# Patient Record
Sex: Female | Born: 1940
Health system: Southern US, Community
[De-identification: ages and names within clinical notes are randomized; demographics above are authoritative.]

## PROBLEM LIST (undated history)

## (undated) DIAGNOSIS — F329 Major depressive disorder, single episode, unspecified: Secondary | ICD-10-CM

## (undated) DIAGNOSIS — I10 Essential (primary) hypertension: Secondary | ICD-10-CM

## (undated) DIAGNOSIS — T4145XA Adverse effect of unspecified anesthetic, initial encounter: Secondary | ICD-10-CM

## (undated) DIAGNOSIS — R55 Syncope and collapse: Secondary | ICD-10-CM

## (undated) DIAGNOSIS — F419 Anxiety disorder, unspecified: Secondary | ICD-10-CM

## (undated) DIAGNOSIS — F32A Depression, unspecified: Secondary | ICD-10-CM

## (undated) DIAGNOSIS — K219 Gastro-esophageal reflux disease without esophagitis: Secondary | ICD-10-CM

## (undated) DIAGNOSIS — T8859XA Other complications of anesthesia, initial encounter: Secondary | ICD-10-CM

## (undated) HISTORY — DX: Syncope and collapse: R55

## (undated) HISTORY — DX: Gastro-esophageal reflux disease without esophagitis: K21.9

---

## 2008-02-24 HISTORY — PX: BACK SURGERY: SHX140

## 2016-04-28 DIAGNOSIS — M1711 Unilateral primary osteoarthritis, right knee: Secondary | ICD-10-CM | POA: Diagnosis not present

## 2016-04-30 DIAGNOSIS — Z6828 Body mass index (BMI) 28.0-28.9, adult: Secondary | ICD-10-CM | POA: Diagnosis not present

## 2016-04-30 DIAGNOSIS — M150 Primary generalized (osteo)arthritis: Secondary | ICD-10-CM | POA: Diagnosis not present

## 2016-04-30 DIAGNOSIS — E663 Overweight: Secondary | ICD-10-CM | POA: Diagnosis not present

## 2016-04-30 DIAGNOSIS — M2550 Pain in unspecified joint: Secondary | ICD-10-CM | POA: Diagnosis not present

## 2016-04-30 DIAGNOSIS — M1009 Idiopathic gout, multiple sites: Secondary | ICD-10-CM | POA: Diagnosis not present

## 2016-05-06 DIAGNOSIS — M1711 Unilateral primary osteoarthritis, right knee: Secondary | ICD-10-CM | POA: Diagnosis not present

## 2016-05-07 DIAGNOSIS — N839 Noninflammatory disorder of ovary, fallopian tube and broad ligament, unspecified: Secondary | ICD-10-CM | POA: Diagnosis not present

## 2016-05-07 DIAGNOSIS — M1A0790 Idiopathic chronic gout, unspecified ankle and foot, without tophus (tophi): Secondary | ICD-10-CM | POA: Diagnosis not present

## 2016-05-07 DIAGNOSIS — I10 Essential (primary) hypertension: Secondary | ICD-10-CM | POA: Diagnosis not present

## 2016-05-07 DIAGNOSIS — E559 Vitamin D deficiency, unspecified: Secondary | ICD-10-CM | POA: Diagnosis not present

## 2016-05-07 DIAGNOSIS — M1711 Unilateral primary osteoarthritis, right knee: Secondary | ICD-10-CM | POA: Diagnosis not present

## 2016-05-14 DIAGNOSIS — M1711 Unilateral primary osteoarthritis, right knee: Secondary | ICD-10-CM | POA: Diagnosis not present

## 2016-05-19 DIAGNOSIS — M1120 Other chondrocalcinosis, unspecified site: Secondary | ICD-10-CM | POA: Diagnosis not present

## 2016-05-19 DIAGNOSIS — R7989 Other specified abnormal findings of blood chemistry: Secondary | ICD-10-CM | POA: Diagnosis not present

## 2016-05-19 DIAGNOSIS — M064 Inflammatory polyarthropathy: Secondary | ICD-10-CM | POA: Diagnosis not present

## 2016-05-19 DIAGNOSIS — M150 Primary generalized (osteo)arthritis: Secondary | ICD-10-CM | POA: Diagnosis not present

## 2016-05-19 DIAGNOSIS — Z6827 Body mass index (BMI) 27.0-27.9, adult: Secondary | ICD-10-CM | POA: Diagnosis not present

## 2016-05-19 DIAGNOSIS — M2550 Pain in unspecified joint: Secondary | ICD-10-CM | POA: Diagnosis not present

## 2016-05-21 DIAGNOSIS — M1711 Unilateral primary osteoarthritis, right knee: Secondary | ICD-10-CM | POA: Diagnosis not present

## 2016-06-04 DIAGNOSIS — M1711 Unilateral primary osteoarthritis, right knee: Secondary | ICD-10-CM | POA: Diagnosis not present

## 2016-06-11 DIAGNOSIS — M1711 Unilateral primary osteoarthritis, right knee: Secondary | ICD-10-CM | POA: Diagnosis not present

## 2016-06-16 DIAGNOSIS — N83202 Unspecified ovarian cyst, left side: Secondary | ICD-10-CM | POA: Diagnosis not present

## 2016-06-25 DIAGNOSIS — M1711 Unilateral primary osteoarthritis, right knee: Secondary | ICD-10-CM | POA: Diagnosis not present

## 2016-06-29 DIAGNOSIS — M1711 Unilateral primary osteoarthritis, right knee: Secondary | ICD-10-CM | POA: Diagnosis not present

## 2016-07-03 DIAGNOSIS — K529 Noninfective gastroenteritis and colitis, unspecified: Secondary | ICD-10-CM | POA: Diagnosis not present

## 2016-07-07 DIAGNOSIS — R197 Diarrhea, unspecified: Secondary | ICD-10-CM | POA: Diagnosis not present

## 2016-07-09 DIAGNOSIS — M170 Bilateral primary osteoarthritis of knee: Secondary | ICD-10-CM | POA: Diagnosis not present

## 2016-07-09 DIAGNOSIS — R197 Diarrhea, unspecified: Secondary | ICD-10-CM | POA: Diagnosis not present

## 2016-07-13 DIAGNOSIS — M1120 Other chondrocalcinosis, unspecified site: Secondary | ICD-10-CM | POA: Diagnosis not present

## 2016-07-13 DIAGNOSIS — M1711 Unilateral primary osteoarthritis, right knee: Secondary | ICD-10-CM | POA: Diagnosis not present

## 2016-07-13 DIAGNOSIS — R197 Diarrhea, unspecified: Secondary | ICD-10-CM | POA: Diagnosis not present

## 2016-07-13 DIAGNOSIS — Z01818 Encounter for other preprocedural examination: Secondary | ICD-10-CM | POA: Diagnosis not present

## 2016-07-13 DIAGNOSIS — M25561 Pain in right knee: Secondary | ICD-10-CM | POA: Diagnosis not present

## 2016-07-27 DIAGNOSIS — M79644 Pain in right finger(s): Secondary | ICD-10-CM | POA: Diagnosis not present

## 2016-07-27 DIAGNOSIS — M1712 Unilateral primary osteoarthritis, left knee: Secondary | ICD-10-CM | POA: Diagnosis not present

## 2016-08-05 DIAGNOSIS — M150 Primary generalized (osteo)arthritis: Secondary | ICD-10-CM | POA: Diagnosis not present

## 2016-08-05 DIAGNOSIS — M2550 Pain in unspecified joint: Secondary | ICD-10-CM | POA: Diagnosis not present

## 2016-08-05 DIAGNOSIS — E663 Overweight: Secondary | ICD-10-CM | POA: Diagnosis not present

## 2016-08-05 DIAGNOSIS — M064 Inflammatory polyarthropathy: Secondary | ICD-10-CM | POA: Diagnosis not present

## 2016-08-05 DIAGNOSIS — M1120 Other chondrocalcinosis, unspecified site: Secondary | ICD-10-CM | POA: Diagnosis not present

## 2016-08-05 DIAGNOSIS — Z6826 Body mass index (BMI) 26.0-26.9, adult: Secondary | ICD-10-CM | POA: Diagnosis not present

## 2016-08-05 DIAGNOSIS — R7989 Other specified abnormal findings of blood chemistry: Secondary | ICD-10-CM | POA: Diagnosis not present

## 2016-08-19 DIAGNOSIS — Z6826 Body mass index (BMI) 26.0-26.9, adult: Secondary | ICD-10-CM | POA: Diagnosis not present

## 2016-08-19 DIAGNOSIS — R7989 Other specified abnormal findings of blood chemistry: Secondary | ICD-10-CM | POA: Diagnosis not present

## 2016-08-19 DIAGNOSIS — E663 Overweight: Secondary | ICD-10-CM | POA: Diagnosis not present

## 2016-08-19 DIAGNOSIS — M064 Inflammatory polyarthropathy: Secondary | ICD-10-CM | POA: Diagnosis not present

## 2016-08-19 DIAGNOSIS — M2550 Pain in unspecified joint: Secondary | ICD-10-CM | POA: Diagnosis not present

## 2016-08-19 DIAGNOSIS — M1120 Other chondrocalcinosis, unspecified site: Secondary | ICD-10-CM | POA: Diagnosis not present

## 2016-08-19 DIAGNOSIS — M25461 Effusion, right knee: Secondary | ICD-10-CM | POA: Diagnosis not present

## 2016-08-19 DIAGNOSIS — M150 Primary generalized (osteo)arthritis: Secondary | ICD-10-CM | POA: Diagnosis not present

## 2016-09-16 DIAGNOSIS — M170 Bilateral primary osteoarthritis of knee: Secondary | ICD-10-CM | POA: Diagnosis not present

## 2016-10-15 ENCOUNTER — Emergency Department (HOSPITAL_COMMUNITY)
Admission: EM | Admit: 2016-10-15 | Discharge: 2016-10-16 | Disposition: A | Discharge status: Home / Self Care | Payer: PPO | Attending: Emergency Medicine | Admitting: Emergency Medicine

## 2016-10-15 ENCOUNTER — Encounter (HOSPITAL_COMMUNITY): Payer: Self-pay

## 2016-10-15 ENCOUNTER — Other Ambulatory Visit: Payer: Self-pay

## 2016-10-15 ENCOUNTER — Emergency Department (HOSPITAL_COMMUNITY): Payer: PPO

## 2016-10-15 DIAGNOSIS — I10 Essential (primary) hypertension: Secondary | ICD-10-CM | POA: Insufficient documentation

## 2016-10-15 DIAGNOSIS — Z79899 Other long term (current) drug therapy: Secondary | ICD-10-CM | POA: Insufficient documentation

## 2016-10-15 DIAGNOSIS — R55 Syncope and collapse: Secondary | ICD-10-CM | POA: Diagnosis not present

## 2016-10-15 DIAGNOSIS — R4182 Altered mental status, unspecified: Secondary | ICD-10-CM | POA: Diagnosis not present

## 2016-10-15 HISTORY — DX: Essential (primary) hypertension: I10

## 2016-10-15 LAB — I-STAT CHEM 8, ED
BUN: 18 mg/dL (ref 6–20)
CHLORIDE: 105 mmol/L (ref 101–111)
CREATININE: 0.9 mg/dL (ref 0.44–1.00)
Calcium, Ion: 1.07 mmol/L — ABNORMAL LOW (ref 1.15–1.40)
Glucose, Bld: 107 mg/dL — ABNORMAL HIGH (ref 65–99)
HEMATOCRIT: 38 % (ref 36.0–46.0)
HEMOGLOBIN: 12.9 g/dL (ref 12.0–15.0)
Potassium: 4.2 mmol/L (ref 3.5–5.1)
Sodium: 140 mmol/L (ref 135–145)
TCO2: 25 mmol/L (ref 0–100)

## 2016-10-15 LAB — PROTIME-INR
INR: 0.95
PROTHROMBIN TIME: 12.7 s (ref 11.4–15.2)

## 2016-10-15 LAB — COMPREHENSIVE METABOLIC PANEL
ALBUMIN: 3.9 g/dL (ref 3.5–5.0)
ALT: 21 U/L (ref 14–54)
AST: 23 U/L (ref 15–41)
Alkaline Phosphatase: 80 U/L (ref 38–126)
Anion gap: 9 (ref 5–15)
BUN: 15 mg/dL (ref 6–20)
CO2: 25 mmol/L (ref 22–32)
Calcium: 9.2 mg/dL (ref 8.9–10.3)
Chloride: 106 mmol/L (ref 101–111)
Creatinine, Ser: 0.9 mg/dL (ref 0.44–1.00)
GFR calc Af Amer: >60 mL/min (ref >60)
GFR calc non Af Amer: >60 mL/min (ref >60)
GLUCOSE: 103 mg/dL — AB (ref 65–99)
POTASSIUM: 4.2 mmol/L (ref 3.5–5.1)
Sodium: 140 mmol/L (ref 135–145)
Total Bilirubin: 0.4 mg/dL (ref 0.3–1.2)
Total Protein: 6.3 g/dL — ABNORMAL LOW (ref 6.5–8.1)

## 2016-10-15 LAB — DIFFERENTIAL
BASOS PCT: 1 %
Basophils Absolute: 0.1 10*3/uL (ref 0.0–0.1)
EOS ABS: 0.2 10*3/uL (ref 0.0–0.7)
EOS PCT: 3 %
LYMPHS PCT: 33 %
Lymphs Abs: 2.4 10*3/uL (ref 0.7–4.0)
MONO ABS: 0.4 10*3/uL (ref 0.1–1.0)
Monocytes Relative: 6 %
Neutro Abs: 4.2 10*3/uL (ref 1.7–7.7)
Neutrophils Relative %: 57 %

## 2016-10-15 LAB — URINALYSIS, ROUTINE W REFLEX MICROSCOPIC
BACTERIA UA: NONE SEEN
BILIRUBIN URINE: NEGATIVE
GLUCOSE, UA: NEGATIVE mg/dL
HGB URINE DIPSTICK: NEGATIVE
KETONES UR: NEGATIVE mg/dL
NITRITE: NEGATIVE
PROTEIN: NEGATIVE mg/dL
Specific Gravity, Urine: 1.015 (ref 1.005–1.030)
Squamous Epithelial / LPF: NONE SEEN
pH: 6 (ref 5.0–8.0)

## 2016-10-15 LAB — CBC
HEMATOCRIT: 38.8 % (ref 36.0–46.0)
Hemoglobin: 13 g/dL (ref 12.0–15.0)
MCH: 32.5 pg (ref 26.0–34.0)
MCHC: 33.5 g/dL (ref 30.0–36.0)
MCV: 97 fL (ref 78.0–100.0)
PLATELETS: 300 10*3/uL (ref 150–400)
RBC: 4 MIL/uL (ref 3.87–5.11)
RDW: 13.7 % (ref 11.5–15.5)
WBC: 7.3 10*3/uL (ref 4.0–10.5)

## 2016-10-15 LAB — I-STAT TROPONIN, ED: Troponin i, poc: 0.01 ng/mL (ref 0.00–0.08)

## 2016-10-15 LAB — D-DIMER, QUANTITATIVE: D-Dimer, Quant: 0.36 ug/mL-FEU (ref 0.00–0.50)

## 2016-10-15 LAB — APTT: aPTT: 31 seconds (ref 24–36)

## 2016-10-15 NOTE — ED Provider Notes (Signed)
Medical screening examination/treatment/procedure(s) were conducted as a shared visit with non-physician practitioner(s) and myself.  I personally evaluated the patient during the encounter.   EKG Interpretation  Date/Time:  Thursday October 15 2016 16:34:32 EDT Ventricular Rate:  84 PR Interval:  178 QRS Duration: 74 QT Interval:  368 QTC Calculation: 434 R Axis:   81 Text Interpretation:  Normal sinus rhythm Low voltage QRS Borderline ECG Confirmed by Charlesetta Shanks [phone]) on 10/15/2016 11:26:36 PM     Syncopal episode a week ago. She had gotten up in the middle the night to go the bathroom and felt unsteady. She had then complete loss of consciousness once in the bathroom. She reports when she awakened she felt that it was somewhat difficult to get back up. She however got up and then went on to clean up in the bathroom, get fresh closing it changed without any focal weakness numbness or tingling. She has been completely asymptomatic for the subsequent week. Patient does take a daily 81 mg aspirin. On examination patient is alert and appropriate. She has normal exam. Normal neurologic exam. CT head is negative. Other diagnostic studies within normal limits. At this point, I feel that TIA/CVA is much lower probability given patient's description of complete loss of consciousness followed by intact cognitive and motor function. Patient denies she experienced headache preceding episode. She has not had headache over the course of the week. I did advise at this point, additional testing to completely rule out CVA would require MRI. Currently patient does not wish to proceed with MRI and will follow-up with her PCP. Detailed precautionary instructions for return are reviewed.   Charlesetta Shanks, MD 10/15/16 2348

## 2016-10-15 NOTE — ED Notes (Signed)
Code stroke entered by accident, not a code strok.e

## 2016-10-15 NOTE — ED Triage Notes (Addendum)
Pt sent by pcp because pt had syncopal episode last Wednesday. Pt states that she walked to the bathroom and remembered waking up in the floor. Pt urinated on self. Pt has had no sx since and no complaints since. VSS. No neuro deficits. Pt also thinks she has Hand foot mouth x 1.5 weeks and has not been treated for it.

## 2016-10-15 NOTE — Discharge Instructions (Signed)
Your evaluated in the emergency department after an episode of passing out. Your blood work, imaging and other workup was reassuring today. Please follow-up with your primary care provider as soon as possible for further discussion of your ED visit today. Additionally please contact cardiologist. You may need further imaging including MRI and/or cardiac monitoring.  Please return to the emergency department if you develop numbness, paralysis or weakness to extremities, visual changes, facial drooping, difficulty with speech, difficulty walking, severe headache.  Also, return for any chest pain, shortness of breath, palpitations or if you pass out again.

## 2016-10-15 NOTE — ED Provider Notes (Signed)
[facility] DEPT Provider Note   CSN: [id number] Arrival date & time: 10/15/16  1631     History   Chief Complaint Chief Complaint  Patient presents with  . Stroke Symptoms    HPI Dominique Griffith is a 76 y.o. female with h/o HTN presents to ED for evaluation on unwitnessed syncopal episode 1 week ago. She woke up in the middle of the night to use the bathroom, made it to the bathroom and passed out. She remembers waking up on the floor with urine. She does not know how long she was down/out for. Eventually she was able to get herself up without assist. She denies prodromal symptoms including CP, SOB, palpitations, dizziness, clamminess, nausea. She spoke to her PCP who advised her to come to ED for work up. 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 Soon after she developed a sore throat, which resolved, and then pruritic, tender red lesions to palms or hands and soles of feet. Lesions have started to dissipate.   She denies preceding or recent fevers, cough, SOB, CP, palpitations, melena, hematochezia, recent vomiting or diarrhea. Has been feeling "normal" for the last several weeks before syncopal episode and ever since.   Denies pmh of anemia, DM, PE/DVT, aneurysms, TIA/CVA, arrhythmias, rectal bleeding, seizures.   HPI  Past Medical History:  Diagnosis Date  . Hypertension     There are no active problems to display for this patient.   History reviewed. No pertinent surgical history.  OB History    No data available       Home Medications    Prior to Admission medications   Medication Sig Start Date End Date Taking? Authorizing Provider  baclofen (LIORESAL) 10 MG tablet Take 10 mg by mouth 3 (three) times daily. 09/25/16  Yes [provider]  buPROPion (WELLBUTRIN XL) 150 MG 24 hr tablet Take 150 mg by mouth daily. 08/11/16  Yes [provider]  colchicine 0.6 MG tablet Take 0.6 mg by  mouth daily. 08/13/16  Yes [provider]  diclofenac sodium (VOLTAREN) 1 % GEL Apply 1 application topically as needed. 09/05/16  Yes [provider]  escitalopram (LEXAPRO) 20 MG tablet Take 20 mg by mouth daily. 08/11/16  Yes [provider]  famotidine (PEPCID) 40 MG tablet Take 40 mg by mouth daily. 09/04/16  Yes [provider]  glucosamine-chondroitin 500-400 MG tablet Take 1 tablet by mouth daily.   Yes [provider]  HYDROcodone-acetaminophen (NORCO) 10-325 MG tablet Take 1 tablet by mouth daily as needed. 09/17/16  Yes [provider]  losartan (COZAAR) 100 MG tablet Take 100 mg by mouth daily. 08/19/16  Yes [provider]    Family History History reviewed. No pertinent family history.  Social History Social History  Substance Use Topics  . Smoking status: Never Smoker  . Smokeless tobacco: Not on file  . Alcohol use Yes     Comment: occ     Allergies   Penicillins   Review of Systems Review of Systems  Constitutional: Negative for chills, diaphoresis and fever.  HENT: Positive for sore throat (resolved). Negative for congestion.   Eyes: Negative for visual disturbance.  Respiratory: Negative for cough, chest tightness and shortness of breath.   Cardiovascular: Negative for chest pain, palpitations and leg swelling.  Gastrointestinal: Negative for abdominal pain, blood in stool, constipation, diarrhea, nausea and vomiting.  Genitourinary: Negative for difficulty urinating, dysuria and hematuria.  Musculoskeletal: Negative for arthralgias and back pain.  Skin: Positive for rash (to palms and soles). Negative for wound.  Neurological: Positive for syncope. Negative for headaches.     Physical Exam Updated Vital Signs BP (!) 153/89 (BP Location: Left Arm)   Pulse 66   Temp 98.5 F (36.9 C)   Resp 18   SpO2 100%   Physical Exam  Constitutional: She is oriented to person, place, and time. She appears  well-developed and well-nourished. No distress.  HENT:  Head: Normocephalic and atraumatic.  Nose: Nose normal.  Mouth/Throat: Oropharynx is clear and moist. No oropharyngeal exudate.  No evidence of facial or head trauma No midline cervical spine tenderness, able to rotate cervical spine 45 degrees without pain  Eyes: Pupils are equal, round, and reactive to light. Conjunctivae and EOM are normal.  No conjunctival pallor Slightly dry mucous membranes  Neck: Normal range of motion. Neck supple.  Cardiovascular: Normal rate, regular rhythm, normal heart sounds and intact distal pulses.   No murmur heard. No murmurs. No S3.  No LE edema. No carotid bruits bilaterally Radial, femoral, DP pulses 2+ and symmetric   Pulmonary/Chest: Effort normal and breath sounds normal. No respiratory distress. She has no wheezes. She has no rales. She exhibits no tenderness.  Abdominal: Soft. Bowel sounds are normal. There is no tenderness.  No obvious abdominal masses No abdominal tenderness No suprapubic or CVA tenderness  Musculoskeletal: Normal range of motion. She exhibits no deformity.  Lymphadenopathy:    She has no cervical adenopathy.  Neurological: She is alert and oriented to person, place, and time. No sensory deficit.  A&O to self, place and time. Speech and phonation normal.  Thought process coherent.   Strength 5/5 in upper and lower extremities.   Sensation to light touch intact in upper and lower extremities.  Gait normal.   Negative Romberg. No leg drift.  Intact finger to nose test. CN I not tested CN II full visual fields  CN III, IV, VI PEERL and EOMs intact bilaterally CN V light touch intact in all 3 divisions of trigeminal nerve CN VII facial nerve movements intact, symmetric, bilaterally CN VIII hearing intact to finger rub, bilaterally CN IX, X no uvula deviation, symmetric soft palate rise CN XI 5/5 SCM and trapezius strength bilaterally  CN XII Tongue midline with  symmetric L/R movement  Skin: Skin is warm and dry. Capillary refill takes less than 2 seconds.  Psychiatric: She has a normal mood and affect. Her behavior is normal. Judgment and thought content normal.  Nursing note and vitals reviewed.    ED Treatments / Results  Labs (all labs ordered are listed, but only abnormal results are displayed) Labs Reviewed  COMPREHENSIVE METABOLIC PANEL - Abnormal; Notable for the following:       Result Value   Glucose, Bld 103 (*)    Total Protein 6.3 (*)    All other components within normal limits  URINALYSIS, ROUTINE W REFLEX MICROSCOPIC - Abnormal; Notable for the following:    Leukocytes, UA SMALL (*)    All other components within normal limits  I-STAT CHEM 8, ED - Abnormal; Notable for the following:    Glucose, Bld 107 (*)    Calcium, Ion 1.07 (*)    All other components within normal limits  PROTIME-INR  APTT  CBC  DIFFERENTIAL  D-DIMER, QUANTITATIVE (NOT AT [facility])  RPR  RAPID HIV SCREEN (HIV 1/2 AB+AG)  I-STAT TROPONIN, ED  CBG MONITORING, ED  EKG  EKG Interpretation  Date/Time:  Thursday October 15 2016 16:34:32 EDT Ventricular Rate:  84 PR Interval:  178 QRS Duration: 74 QT Interval:  368 QTC Calculation: 434 R Axis:   81 Text Interpretation:  Normal sinus rhythm Low voltage QRS Borderline ECG Confirmed by Charlesetta Shanks [phone]) on 10/15/2016 11:26:36 PM       Radiology Dg Chest 2 View  Result Date: 10/15/2016 CLINICAL DATA:  Syncope EXAM: CHEST  2 VIEW COMPARISON:  None. FINDINGS: Lungs are clear.  No pleural effusion or pneumothorax. The heart is normal in size. Mildly exaggerated thoracic kyphosis. IMPRESSION: No active cardiopulmonary disease. Electronically Signed   By: Julian Hy M.D.   On: 10/15/2016 22:47   Ct Head Wo Contrast  Result Date: 10/15/2016 CLINICAL DATA:  Altered level of consciousness.  Syncope last week. EXAM: CT HEAD WITHOUT CONTRAST TECHNIQUE: Contiguous axial images were obtained  from the base of the skull through the vertex without intravenous contrast. COMPARISON:  None. FINDINGS: Brain: No subdural, epidural, or subarachnoid hemorrhage. Ventricles and sulci are normal. Cerebellum, brainstem, and basal cisterns are within normal limits. No acute cortical ischemia or infarct. No mass effect or midline shift. Vascular: Calcified atherosclerosis is seen in the intracranial carotid arteries. Skull: Normal. Negative for fracture or focal lesion. Sinuses/Orbits: No acute finding. Other: None. IMPRESSION: No acute abnormality to explain the patient's altered consciousness. Electronically Signed   By: Dorise Bullion III M.D   On: 10/15/2016 17:56    Procedures Procedures (including critical care time)  Medications Ordered in ED Medications - No data to display   Initial Impression / Assessment and Plan / ED Course  I have reviewed the triage vital signs and the nursing notes.  Pertinent labs & imaging results that were available during my care of the patient were reviewed by me and considered in my medical decision making (see chart for details).   76 year old female with history of HTN presents to the ED for evaluation of unwitnessed syncope and collapse 1 week ago. No prodromal symptoms. Has been been feeling at baseline before and since syncopal episode. No numbness, weakness, dysphagia, dysarthria, balance issues, headache.   Exam today is reassuring. Normal volume status. No head/neck trauma. No murmurs or S3 noted on exam. Distal pulses symmetric and intact. Abdomen without pulsating masses or tenderness. Neuro intact.   Considering cardiovascular syncope vs vasovagal vs orthostatic. Less likely TIA or PE. No h/o aneurysm, TIA/CVA, MI, HF, seizures, anemia, arrhythmias. Does take norco.   Final Clinical Impressions(s) / ED Diagnoses   Final diagnoses:  Syncope and collapse   ED lab work reviewed and mostly reassuring. No leukocytosis or anemia.  Hgb normal so no  DRE done. Electrolytes WNL. Trop and d-dimer negative. EKG w/o ischemia, QT abnormalities or arrhythmias. No enlargement of aorta on CXR, distal pulses 2+ and symmetric. No carotid bruits, no h/o of HLD, DM or carotid artery stenosis. CT scan negative, no neuro deficits or stroke s/s before or after syncope. No evidence of infection in U/A or CXR. CK considered low yield at this time.   Pt considered safe for discharge for this time with outpatient work up. Shared visit with supervising physician who evaluated patient. Pt declined MRI today and would like to finish work up as outpatient. Will d/c to PCP and cardiology.   New Prescriptions New Prescriptions   No medications on file     Arlean Hopping 10/15/16 2348    Charlesetta Shanks, MD 10/19/16 1537

## 2016-10-16 LAB — RAPID HIV SCREEN (HIV 1/2 AB+AG)
HIV 1/2 ANTIBODIES: NONREACTIVE
HIV-1 P24 ANTIGEN - HIV24: NONREACTIVE

## 2016-10-16 LAB — RPR: RPR Ser Ql: NONREACTIVE

## 2016-10-28 DIAGNOSIS — R270 Ataxia, unspecified: Secondary | ICD-10-CM | POA: Diagnosis not present

## 2016-10-28 DIAGNOSIS — R55 Syncope and collapse: Secondary | ICD-10-CM | POA: Diagnosis not present

## 2016-10-28 DIAGNOSIS — R9431 Abnormal electrocardiogram [ECG] [EKG]: Secondary | ICD-10-CM | POA: Diagnosis not present

## 2016-10-29 ENCOUNTER — Other Ambulatory Visit: Payer: Self-pay | Admitting: Internal Medicine

## 2016-10-29 DIAGNOSIS — R55 Syncope and collapse: Secondary | ICD-10-CM

## 2016-10-29 DIAGNOSIS — R32 Unspecified urinary incontinence: Secondary | ICD-10-CM

## 2016-11-02 ENCOUNTER — Encounter: Payer: Self-pay | Admitting: Neurology

## 2016-11-02 DIAGNOSIS — M170 Bilateral primary osteoarthritis of knee: Secondary | ICD-10-CM | POA: Diagnosis not present

## 2016-11-05 ENCOUNTER — Ambulatory Visit
Admission: RE | Admit: 2016-11-05 | Discharge: 2016-11-05 | Disposition: A | Discharge status: Home / Self Care | Payer: PPO | Source: Ambulatory Visit | Attending: Internal Medicine | Admitting: Internal Medicine

## 2016-11-05 DIAGNOSIS — I6523 Occlusion and stenosis of bilateral carotid arteries: Secondary | ICD-10-CM | POA: Diagnosis not present

## 2016-11-05 DIAGNOSIS — R55 Syncope and collapse: Secondary | ICD-10-CM

## 2016-11-05 DIAGNOSIS — R32 Unspecified urinary incontinence: Secondary | ICD-10-CM

## 2016-11-08 ENCOUNTER — Other Ambulatory Visit: Payer: Self-pay

## 2016-11-18 DIAGNOSIS — M1711 Unilateral primary osteoarthritis, right knee: Secondary | ICD-10-CM | POA: Diagnosis not present

## 2016-11-18 DIAGNOSIS — I10 Essential (primary) hypertension: Secondary | ICD-10-CM | POA: Diagnosis not present

## 2016-11-18 DIAGNOSIS — K219 Gastro-esophageal reflux disease without esophagitis: Secondary | ICD-10-CM | POA: Diagnosis not present

## 2016-11-18 DIAGNOSIS — Z0000 Encounter for general adult medical examination without abnormal findings: Secondary | ICD-10-CM | POA: Diagnosis not present

## 2016-11-18 DIAGNOSIS — E559 Vitamin D deficiency, unspecified: Secondary | ICD-10-CM | POA: Diagnosis not present

## 2016-11-18 DIAGNOSIS — N949 Unspecified condition associated with female genital organs and menstrual cycle: Secondary | ICD-10-CM | POA: Diagnosis not present

## 2016-11-18 DIAGNOSIS — Z1389 Encounter for screening for other disorder: Secondary | ICD-10-CM | POA: Diagnosis not present

## 2016-11-18 DIAGNOSIS — Z23 Encounter for immunization: Secondary | ICD-10-CM | POA: Diagnosis not present

## 2016-11-19 DIAGNOSIS — M1120 Other chondrocalcinosis, unspecified site: Secondary | ICD-10-CM | POA: Diagnosis not present

## 2016-11-19 DIAGNOSIS — E663 Overweight: Secondary | ICD-10-CM | POA: Diagnosis not present

## 2016-11-19 DIAGNOSIS — Z6826 Body mass index (BMI) 26.0-26.9, adult: Secondary | ICD-10-CM | POA: Diagnosis not present

## 2016-11-19 DIAGNOSIS — M150 Primary generalized (osteo)arthritis: Secondary | ICD-10-CM | POA: Diagnosis not present

## 2016-11-19 DIAGNOSIS — M2550 Pain in unspecified joint: Secondary | ICD-10-CM | POA: Diagnosis not present

## 2016-11-21 ENCOUNTER — Ambulatory Visit
Admission: RE | Admit: 2016-11-21 | Discharge: 2016-11-21 | Disposition: A | Discharge status: Home / Self Care | Payer: PPO | Source: Ambulatory Visit | Attending: Internal Medicine | Admitting: Internal Medicine

## 2016-11-21 DIAGNOSIS — R55 Syncope and collapse: Secondary | ICD-10-CM

## 2016-11-21 DIAGNOSIS — R32 Unspecified urinary incontinence: Secondary | ICD-10-CM

## 2016-12-23 DIAGNOSIS — Z789 Other specified health status: Secondary | ICD-10-CM | POA: Diagnosis not present

## 2016-12-23 DIAGNOSIS — M1711 Unilateral primary osteoarthritis, right knee: Secondary | ICD-10-CM | POA: Diagnosis not present

## 2016-12-23 DIAGNOSIS — M1712 Unilateral primary osteoarthritis, left knee: Secondary | ICD-10-CM | POA: Diagnosis not present

## 2017-01-20 ENCOUNTER — Encounter: Payer: Self-pay | Admitting: Neurology

## 2017-01-20 ENCOUNTER — Ambulatory Visit (INDEPENDENT_AMBULATORY_CARE_PROVIDER_SITE_OTHER): Payer: PPO | Admitting: Neurology

## 2017-01-20 VITALS — BP 152/80 | HR 113 | Ht 63.0 in | Wt 152.0 lb

## 2017-01-20 DIAGNOSIS — R55 Syncope and collapse: Secondary | ICD-10-CM

## 2017-01-20 DIAGNOSIS — R413 Other amnesia: Secondary | ICD-10-CM | POA: Diagnosis not present

## 2017-01-20 NOTE — Progress Notes (Signed)
NEUROLOGY CONSULTATION NOTE  YESHA MUCHOW MRN: [id number] DOB: [birth year]  Referring provider: Dr. Emi Belfast Primary care provider: Dr. Emi Belfast  Reason for consult:  Syncope, ataxia  Dear Dr Coralyn Mark:  Thank you for your kind referral of Dominique Griffith for consultation of the above symptoms. Although her history is well known to you, please allow me to reiterate it for the purpose of our medical record. Records and images were personally reviewed where available.  HISTORY OF PRESENT ILLNESS: This is a pleasant 76 year old rightt-handed woman with a history of hypertension, osteoarthritis, presenting for evaluation of a syncopal episode that occurred last August 2018. She recalls it was the middle of the night and she got up to go to the bathroom and was "bouncing off the walls." She recalls getting into the bathroom, then waking up on the floor with urinary incontinence. No significant bruises. She recalls wanting to pick up the bathroom mat with her right hand but was unable to do it, then a few minutes later she could pick it up. She does not recall feeling cold/clammy, no headache or dizziness that she could recall. She was not unsteady when she was finally able to get up. She went to the ER for evaluation on 10/15/16, bloodwork unremarkable. I personally reviewed head CT with and without contrast which did not show any acute changes, there was mild diffuse atrophy and minimal chronic microvascular disease, mild cerebellar atrophy, no abnormal enhancement. EKG showed normal sinus rhythm, low voltage QRS. She denies any prior similar symptoms, and no further episodes in August. She denies any staring/unresponsive episodes, gaps in time, olfactory/gustatory hallucinations, deja vu, rising epigastric sensation, focal numbness/tingling/weakness, myoclonic jerks. She denies any recent changes in medications, sleep deprivation, alcohol use, or infections at that time. She  denies any palpitations, chest pain, shortness of breath. She denies any further imbalance, no headaches, dizziness, diplopia, dysarthria/dysphagia, bowel dysfunction. She occasionally feels numb in both forearms down to her hands. She has constipation. She had a normal birth and early development.  There is no history of febrile convulsions, CNS infections such as meningitis/encephalitis, significant traumatic brain injury, neurosurgical procedures, or family history of seizures. She reports a history of stroke in her mother and heart attack in her father.   She is also reporting that her memory has been bad recently. She visited her daughter and took Christmas decorations, asking them if they wanted a certain decoration, they reminded her that she already got that for them. She would buy the same thing for her grandson. She reports that after surgery in 2010, she had no memory for the first 2 months, but this improved, except that she used to spell everything much better but now has more difficulties. She moved here a year ago and gets lost because roads are still unfamiliar. She denies any missed bills or medications.   PAST MEDICAL HISTORY: Past Medical History:  Diagnosis Date  . Hypertension     PAST SURGICAL HISTORY: Past Surgical History:  Procedure Laterality Date  . BACK SURGERY      MEDICATIONS: Current Outpatient Medications on File Prior to Visit  Medication Sig Dispense Refill  . baclofen (LIORESAL) 10 MG tablet Take 10 mg by mouth 3 (three) times daily.    Marland Kitchen buPROPion (WELLBUTRIN XL) 150 MG 24 hr tablet Take 150 mg by mouth daily.    . colchicine 0.6 MG tablet Take 0.6 mg by mouth daily.    . diclofenac sodium (VOLTAREN) 1 %  GEL Apply 1 application topically as needed.    Marland Kitchen escitalopram (LEXAPRO) 20 MG tablet Take 20 mg by mouth daily.    . famotidine (PEPCID) 40 MG tablet Take 40 mg by mouth daily.    Marland Kitchen glucosamine-chondroitin 500-400 MG tablet Take 1 tablet by mouth daily.     Marland Kitchen HYDROcodone-acetaminophen (NORCO) 10-325 MG tablet Take 1 tablet by mouth daily as needed.  0  . losartan (COZAAR) 100 MG tablet Take 100 mg by mouth daily.     No current facility-administered medications on file prior to visit.     ALLERGIES: Allergies  Allergen Reactions  . Penicillins Rash    Has patient had a PCN reaction causing immediate rash, facial/tongue/throat swelling, SOB or lightheadedness with hypotension: No Has patient had a PCN reaction causing severe rash involving mucus membranes or skin necrosis: No Has patient had a PCN reaction that required hospitalization: No Has patient had a PCN reaction occurring within the last 10 years: No If all of the above answers are "NO", then may proceed with Cephalosporin use.    FAMILY HISTORY: No family history on file.  SOCIAL HISTORY: Social History   Socioeconomic History  . Marital status: Married    Spouse name: Not on file  . Number of children: Not on file  . Years of education: Not on file  . Highest education level: Not on file  Social Needs  . Financial resource strain: Not on file  . Food insecurity - worry: Not on file  . Food insecurity - inability: Not on file  . Transportation needs - medical: Not on file  . Transportation needs - non-medical: Not on file  Occupational History  . Not on file  Tobacco Use  . Smoking status: Never Smoker  Substance and Sexual Activity  . Alcohol use: Yes    Comment: occ  . Drug use: No  . Sexual activity: Not on file  Other Topics Concern  . Not on file  Social History Narrative  . Not on file    REVIEW OF SYSTEMS: Constitutional: No fevers, chills, or sweats, no generalized fatigue, change in appetite Eyes: No visual changes, double vision, eye pain Ear, nose and throat: No hearing loss, ear pain, nasal congestion, sore throat Cardiovascular: No chest pain, palpitations Respiratory:  No shortness of breath at rest or with exertion,  wheezes GastrointestinaI: No nausea, vomiting, diarrhea, abdominal pain, fecal incontinence Genitourinary:  No dysuria, urinary retention or frequency Musculoskeletal:  No neck pain, back pain Integumentary: No rash, pruritus, skin lesions Neurological: as above Psychiatric: No depression, insomnia, anxiety Endocrine: No palpitations, fatigue, diaphoresis, mood swings, change in appetite, change in weight, increased thirst Hematologic/Lymphatic:  No anemia, purpura, petechiae. Allergic/Immunologic: no itchy/runny eyes, nasal congestion, recent allergic reactions, rashes  PHYSICAL EXAM: Vitals:   01/20/17 1411  BP: (!) 152/80  Pulse: (!) 113  SpO2: 95%   General: No acute distress Head:  Normocephalic/atraumatic Eyes: Fundoscopic exam shows bilateral sharp discs, no vessel changes, exudates, or hemorrhages Neck: supple, no paraspinal tenderness, full range of motion Back: No paraspinal tenderness Heart: regular rate and rhythm Lungs: Clear to auscultation bilaterally. Vascular: No carotid bruits. Skin/Extremities: No rash, no edema Neurological Exam: Mental status: alert and oriented to person, place, and time, no dysarthria or aphasia, Fund of knowledge is appropriate.  Recent and remote memory are intact.  Attention and concentration are normal.    Able to name objects and repeat phrases. CDT 5/5 MMSE - Mini Mental State Exam 01/20/2017  Orientation to time 5  Orientation to Place 5  Registration 3  Attention/ Calculation 5  Recall 3  Language- name 2 objects 2  Language- repeat 1  Language- follow 3 step command 3  Language- read & follow direction 1  Write a sentence 1  Copy design 1  Total score 30   Cranial nerves: CN I: not tested CN II: pupils equal, round and reactive to light, visual fields intact, fundi unremarkable. CN III, IV, VI:  full range of motion, no nystagmus, no ptosis CN V: facial sensation intact CN VII: upper and lower face symmetric CN VIII:  hearing intact to finger rub CN IX, X: gag intact, uvula midline CN XI: sternocleidomastoid and trapezius muscles intact CN XII: tongue midline Bulk & Tone: normal, no fasciculations. Motor: 5/5 throughout with no pronator drift. Sensation: intact to light touch, cold, pin, joint position sense. Decreased vibration to ankle on right. No extinction to double simultaneous stimulation.  Romberg test negative Deep Tendon Reflexes: +2 on both UE, bilateral patella, +1 left ankle jerk, absent right ankle jerk, no ankle clonus Plantar responses: downgoing bilaterally Cerebellar: no incoordination on finger to nose testing Gait: narrow-based and steady, difficulty with tandem walk Tremor: none  IMPRESSION: This is a pleasant 76 year old right-handed woman with a history of hypertension, presenting after a syncopal episode last August 2018. She recalls feeling unbalanced while walking to the bathroom, then woke up on the bathroom floor with urinary incontinence. Etiology of syncopal episode is unclear, her neurological exam is overall unremarkable except for mild neuropathy, no clear epilepsy risk factors. Differential is broad, including cardiac cause of syncope, vasovagal, less likely seizure. She is tachycardic in the office today. She does not want to see another physician, I will order an echocardiogram. A routine EEG will be ordered. TIA is also considered, but low likelihood. Continue daily aspirin, control of vascular risk factors. She reports worsening memory, MMSE today 30/30, continue to monitor, no indication to start cholinesterase inhibitors at this time. She will follow-up in 6 months and knows to call for any changes.   Thank you for allowing me to participate in the care of this patient. Please do not hesitate to call for any questions or concerns.   Ellouise Newer, M.D.  CC: Dr. Coralyn Mark

## 2017-01-20 NOTE — Patient Instructions (Signed)
1. Schedule echocardiogram 2. Increase fluid intake 3. Continue daily aspirin, control of blood pressure, cholesterol 4. If you start having any new symptoms or have another episode of loss of consciousness, no further driving 5. Follow-up in 6 months, call for any changes   RECOMMENDATIONS FOR ALL PATIENTS WITH MEMORY PROBLEMS: 1. Continue to exercise (Recommend 30 minutes of walking everyday, or 3 hours every week) 2. Increase social interactions - continue going to [place] and enjoy social gatherings with friends and family 3. Eat healthy, avoid fried foods and eat more fruits and vegetables 4. Maintain adequate blood pressure, blood sugar, and blood cholesterol level. Reducing the risk of stroke and cardiovascular disease also helps promoting better memory. 5. Avoid stressful situations. Live a simple life and avoid aggravations. Organize your time and prepare for the next day in anticipation. 6. Sleep well, avoid any interruptions of sleep and avoid any distractions in the bedroom that may interfere with adequate sleep quality 7. Avoid sugar, avoid sweets as there is a strong link between excessive sugar intake, diabetes, and cognitive impairment We discussed the Mediterranean diet, which has been shown to help patients reduce the risk of progressive memory disorders and reduces cardiovascular risk. This includes eating fish, eat fruits and green leafy vegetables, nuts like almonds and hazelnuts, walnuts, and also use olive oil. Avoid fast foods and fried foods as much as possible. Avoid sweets and sugar as sugar use has been linked to worsening of memory function.

## 2017-01-25 DIAGNOSIS — K219 Gastro-esophageal reflux disease without esophagitis: Secondary | ICD-10-CM | POA: Diagnosis not present

## 2017-01-25 DIAGNOSIS — Z1211 Encounter for screening for malignant neoplasm of colon: Secondary | ICD-10-CM | POA: Diagnosis not present

## 2017-01-28 ENCOUNTER — Encounter: Payer: Self-pay | Admitting: Neurology

## 2017-01-28 DIAGNOSIS — R55 Syncope and collapse: Secondary | ICD-10-CM | POA: Insufficient documentation

## 2017-02-17 ENCOUNTER — Encounter (HOSPITAL_COMMUNITY): Payer: Self-pay | Admitting: Neurology

## 2017-02-22 ENCOUNTER — Ambulatory Visit (HOSPITAL_COMMUNITY): Payer: PPO | Attending: Cardiovascular Disease

## 2017-02-22 ENCOUNTER — Other Ambulatory Visit: Payer: Self-pay

## 2017-02-22 DIAGNOSIS — I119 Hypertensive heart disease without heart failure: Secondary | ICD-10-CM | POA: Insufficient documentation

## 2017-02-22 DIAGNOSIS — R55 Syncope and collapse: Secondary | ICD-10-CM | POA: Insufficient documentation

## 2017-02-22 DIAGNOSIS — I351 Nonrheumatic aortic (valve) insufficiency: Secondary | ICD-10-CM | POA: Diagnosis not present

## 2017-02-25 ENCOUNTER — Telehealth: Payer: Self-pay

## 2017-02-25 NOTE — Telephone Encounter (Signed)
LMOM relaying message below.  

## 2017-02-25 NOTE — Telephone Encounter (Signed)
-----   Message from Cameron Sprang, MD sent at 02/24/2017 12:25 PM EST ----- Pls let her know the echocardiogram was overall okay, there were mild changes seen with patients with blood pressure issues, continue to monitor with her PCP. Thanks

## 2017-03-08 DIAGNOSIS — Z1212 Encounter for screening for malignant neoplasm of rectum: Secondary | ICD-10-CM | POA: Diagnosis not present

## 2017-03-08 DIAGNOSIS — Z1211 Encounter for screening for malignant neoplasm of colon: Secondary | ICD-10-CM | POA: Diagnosis not present

## 2017-03-22 DIAGNOSIS — J209 Acute bronchitis, unspecified: Secondary | ICD-10-CM | POA: Diagnosis not present

## 2017-03-22 DIAGNOSIS — J9801 Acute bronchospasm: Secondary | ICD-10-CM | POA: Diagnosis not present

## 2017-03-22 DIAGNOSIS — R05 Cough: Secondary | ICD-10-CM | POA: Diagnosis not present

## 2017-04-07 ENCOUNTER — Ambulatory Visit: Payer: Self-pay | Admitting: Orthopedic Surgery

## 2017-04-07 DIAGNOSIS — H2512 Age-related nuclear cataract, left eye: Secondary | ICD-10-CM | POA: Diagnosis not present

## 2017-04-07 DIAGNOSIS — H2511 Age-related nuclear cataract, right eye: Secondary | ICD-10-CM | POA: Diagnosis not present

## 2017-04-07 DIAGNOSIS — H5213 Myopia, bilateral: Secondary | ICD-10-CM | POA: Diagnosis not present

## 2017-04-12 DIAGNOSIS — I10 Essential (primary) hypertension: Secondary | ICD-10-CM | POA: Diagnosis not present

## 2017-04-12 DIAGNOSIS — M170 Bilateral primary osteoarthritis of knee: Secondary | ICD-10-CM | POA: Diagnosis not present

## 2017-04-12 DIAGNOSIS — F3342 Major depressive disorder, recurrent, in full remission: Secondary | ICD-10-CM | POA: Diagnosis not present

## 2017-04-13 ENCOUNTER — Ambulatory Visit: Payer: Self-pay | Admitting: Orthopedic Surgery

## 2017-04-13 DIAGNOSIS — M1990 Unspecified osteoarthritis, unspecified site: Secondary | ICD-10-CM | POA: Insufficient documentation

## 2017-04-13 NOTE — H&P (View-Only) (Signed)
TOTAL KNEE ADMISSION H&P  Patient is being admitted for right total knee arthroplasty.  Subjective:  Chief Complaint:right knee pain.  HPI: Dominique Griffith, 77 y.o. female, has a history of pain and functional disability in the right knee due to arthritis and has failed non-surgical conservative treatments for greater than 12 weeks to includeNSAID's and/or analgesics, corticosteriod injections, flexibility and strengthening excercises, use of assistive devices, weight reduction as appropriate and activity modification.  Onset of symptoms was gradual, starting >10 years ago with rapidlly worsening course since that time. The patient noted no past surgery on the right knee(s).  Patient currently rates pain in the right knee(s) at 10 out of 10 with activity. Patient has night pain, worsening of pain with activity and weight bearing, pain that interferes with activities of daily living, pain with passive range of motion, crepitus and joint swelling.  Patient has evidence of subchondral cysts, subchondral sclerosis, periarticular osteophytes and joint space narrowing by imaging studies.  There is no active infection.  Patient Active Problem List   Diagnosis Date Noted  . Syncope 01/28/2017   Past Medical History:  Diagnosis Date  . GERD (gastroesophageal reflux disease)   . Hypertension   . Syncope and collapse     Past Surgical History:  Procedure Laterality Date  . BACK SURGERY      Current Outpatient Medications  Medication Sig Dispense Refill Last Dose  . aluminum chloride (DRYSOL) 20 % external solution Apply 1 application topically once a week.     Marland Kitchen amitriptyline (ELAVIL) 25 MG tablet Take 25 mg by mouth at bedtime.     . baclofen (LIORESAL) 10 MG tablet Take 10 mg by mouth 3 (three) times daily.   Taking  . buPROPion (WELLBUTRIN XL) 150 MG 24 hr tablet Take 150 mg by mouth daily.   Taking  . Calcium Carbonate-Vitamin D (CALCIUM-VITAMIN D3 PO) Take 1 tablet by mouth every other day.      . cetirizine (ZYRTEC) 10 MG tablet Take 10 mg by mouth daily.     . colchicine 0.6 MG tablet Take 0.6 mg by mouth daily.   Taking  . diclofenac sodium (VOLTAREN) 1 % GEL Apply 1 application topically daily as needed (for pain).    Taking  . escitalopram (LEXAPRO) 20 MG tablet Take 20 mg by mouth daily.   Taking  . famotidine (PEPCID) 40 MG tablet Take 40 mg by mouth 2 (two) times daily.    Taking  . FIBER PO Take 1 capsule by mouth daily.     Marland Kitchen glucosamine-chondroitin 500-400 MG tablet Take 1 tablet by mouth daily.   Taking  . losartan (COZAAR) 100 MG tablet Take 100 mg by mouth daily.   Taking  . meloxicam (MOBIC) 15 MG tablet Take 15 mg by mouth daily  3 Taking  . Multiple Vitamins-Minerals (MULTIVITAMIN PO) Take 1 tablet by mouth daily.     . nitroGLYCERIN (NITRO-BID) 2 % ointment Apply 0.5 inches topically daily as needed for chest pain.     Marland Kitchen PROAIR HFA 108 (90 Base) MCG/ACT inhaler Inhale 2 puffs into the lungs every 6 (six) hours as needed for shortness of breath or wheezing.  0    No current facility-administered medications for this visit.    Allergies  Allergen Reactions  . Amoxicillin Rash  . Penicillins Rash and Other (See Comments)    Has patient had a PCN reaction causing immediate rash, facial/tongue/throat swelling, SOB or lightheadedness with hypotension: No Has patient had a  PCN reaction causing severe rash involving mucus membranes or skin necrosis: No Has patient had a PCN reaction that required hospitalization: No Has patient had a PCN reaction occurring within the last 10 years: No If all of the above answers are "NO", then may proceed with Cephalosporin use.    Social History   Tobacco Use  . Smoking status: Never Smoker  Substance Use Topics  . Alcohol use: Yes    Comment: occ    Family History  Problem Relation Age of Onset  . Stroke Mother   . Heart attack Father      Review of Systems  Constitutional: Negative.   HENT: Negative.   Eyes:  Negative.   Respiratory: Negative.   Cardiovascular: Negative.   Gastrointestinal: Negative.   Genitourinary: Negative.   Musculoskeletal: Positive for joint pain.  Skin: Negative.   Neurological: Negative.   Endo/Heme/Allergies: Negative.   Psychiatric/Behavioral: Positive for memory loss.    Objective:  Physical Exam  Vitals reviewed. Constitutional: She is oriented to person, place, and time. She appears well-developed and well-nourished.  HENT:  Head: Normocephalic and atraumatic.  Eyes: Conjunctivae and EOM are normal. Pupils are equal, round, and reactive to light.  Neck: Normal range of motion. Neck supple.  Cardiovascular: Normal rate and intact distal pulses.  Respiratory: Effort normal. No respiratory distress.  GI: Soft. She exhibits no distension.  Genitourinary:  Genitourinary Comments: deferred  Musculoskeletal:       Right knee: She exhibits decreased range of motion, swelling, effusion and abnormal alignment. Tenderness found. Medial joint line and lateral joint line tenderness noted.  Neurological: She is alert and oriented to person, place, and time. She has normal reflexes.  Skin: Skin is warm and dry.  Psychiatric: She has a normal mood and affect. Her behavior is normal. Judgment and thought content normal.    Vital signs in last 24 hours: @VSRANGES @  Labs:   Estimated body mass index is 26.93 kg/m as calculated from the following:   Height as of 01/20/17: 5\' 3"  (1.6 m).   Weight as of 01/20/17: 68.9 kg (152 lb).   Imaging Review Plain radiographs demonstrate severe degenerative joint disease of the right knee(s). The overall alignment issignificant varus. The bone quality appears to be adequate for age and reported activity level.  Assessment/Plan:  End stage arthritis, right knee   The patient history, physical examination, clinical judgment of the provider and imaging studies are consistent with end stage degenerative joint disease of the  right knee(s) and total knee arthroplasty is deemed medically necessary. The treatment options including medical management, injection therapy arthroscopy and arthroplasty were discussed at length. The risks and benefits of total knee arthroplasty were presented and reviewed. The risks due to aseptic loosening, infection, stiffness, patella tracking problems, thromboembolic complications and other imponderables were discussed. The patient acknowledged the explanation, agreed to proceed with the plan and consent was signed. Patient is being admitted for inpatient treatment for surgery, pain control, PT, OT, prophylactic antibiotics, VTE prophylaxis, progressive ambulation and ADL's and discharge planning. The patient is planning to be discharged home with outpatient PT at [facility]. Has DME.

## 2017-04-13 NOTE — H&P (Signed)
TOTAL KNEE ADMISSION H&P  Patient is being admitted for right total knee arthroplasty.  Subjective:  Chief Complaint:right knee pain.  HPI: Dominique Griffith, 77 y.o. female, has a history of pain and functional disability in the right knee due to arthritis and has failed non-surgical conservative treatments for greater than 12 weeks to includeNSAID's and/or analgesics, corticosteriod injections, flexibility and strengthening excercises, use of assistive devices, weight reduction as appropriate and activity modification.  Onset of symptoms was gradual, starting >10 years ago with rapidlly worsening course since that time. The patient noted no past surgery on the right knee(s).  Patient currently rates pain in the right knee(s) at 10 out of 10 with activity. Patient has night pain, worsening of pain with activity and weight bearing, pain that interferes with activities of daily living, pain with passive range of motion, crepitus and joint swelling.  Patient has evidence of subchondral cysts, subchondral sclerosis, periarticular osteophytes and joint space narrowing by imaging studies.  There is no active infection.  Patient Active Problem List   Diagnosis Date Noted  . Syncope 01/28/2017   Past Medical History:  Diagnosis Date  . GERD (gastroesophageal reflux disease)   . Hypertension   . Syncope and collapse     Past Surgical History:  Procedure Laterality Date  . BACK SURGERY      Current Outpatient Medications  Medication Sig Dispense Refill Last Dose  . aluminum chloride (DRYSOL) 20 % external solution Apply 1 application topically once a week.     Marland Kitchen amitriptyline (ELAVIL) 25 MG tablet Take 25 mg by mouth at bedtime.     . baclofen (LIORESAL) 10 MG tablet Take 10 mg by mouth 3 (three) times daily.   Taking  . buPROPion (WELLBUTRIN XL) 150 MG 24 hr tablet Take 150 mg by mouth daily.   Taking  . Calcium Carbonate-Vitamin D (CALCIUM-VITAMIN D3 PO) Take 1 tablet by mouth every other day.      . cetirizine (ZYRTEC) 10 MG tablet Take 10 mg by mouth daily.     . colchicine 0.6 MG tablet Take 0.6 mg by mouth daily.   Taking  . diclofenac sodium (VOLTAREN) 1 % GEL Apply 1 application topically daily as needed (for pain).    Taking  . escitalopram (LEXAPRO) 20 MG tablet Take 20 mg by mouth daily.   Taking  . famotidine (PEPCID) 40 MG tablet Take 40 mg by mouth 2 (two) times daily.    Taking  . FIBER PO Take 1 capsule by mouth daily.     Marland Kitchen glucosamine-chondroitin 500-400 MG tablet Take 1 tablet by mouth daily.   Taking  . losartan (COZAAR) 100 MG tablet Take 100 mg by mouth daily.   Taking  . meloxicam (MOBIC) 15 MG tablet Take 15 mg by mouth daily  3 Taking  . Multiple Vitamins-Minerals (MULTIVITAMIN PO) Take 1 tablet by mouth daily.     . nitroGLYCERIN (NITRO-BID) 2 % ointment Apply 0.5 inches topically daily as needed for chest pain.     Marland Kitchen PROAIR HFA 108 (90 Base) MCG/ACT inhaler Inhale 2 puffs into the lungs every 6 (six) hours as needed for shortness of breath or wheezing.  0    No current facility-administered medications for this visit.    Allergies  Allergen Reactions  . Amoxicillin Rash  . Penicillins Rash and Other (See Comments)    Has patient had a PCN reaction causing immediate rash, facial/tongue/throat swelling, SOB or lightheadedness with hypotension: No Has patient had a  PCN reaction causing severe rash involving mucus membranes or skin necrosis: No Has patient had a PCN reaction that required hospitalization: No Has patient had a PCN reaction occurring within the last 10 years: No If all of the above answers are "NO", then may proceed with Cephalosporin use.    Social History   Tobacco Use  . Smoking status: Never Smoker  Substance Use Topics  . Alcohol use: Yes    Comment: occ    Family History  Problem Relation Age of Onset  . Stroke Mother   . Heart attack Father      Review of Systems  Constitutional: Negative.   HENT: Negative.   Eyes:  Negative.   Respiratory: Negative.   Cardiovascular: Negative.   Gastrointestinal: Negative.   Genitourinary: Negative.   Musculoskeletal: Positive for joint pain.  Skin: Negative.   Neurological: Negative.   Endo/Heme/Allergies: Negative.   Psychiatric/Behavioral: Positive for memory loss.    Objective:  Physical Exam  Vitals reviewed. Constitutional: She is oriented to person, place, and time. She appears well-developed and well-nourished.  HENT:  Head: Normocephalic and atraumatic.  Eyes: Conjunctivae and EOM are normal. Pupils are equal, round, and reactive to light.  Neck: Normal range of motion. Neck supple.  Cardiovascular: Normal rate and intact distal pulses.  Respiratory: Effort normal. No respiratory distress.  GI: Soft. She exhibits no distension.  Genitourinary:  Genitourinary Comments: deferred  Musculoskeletal:       Right knee: She exhibits decreased range of motion, swelling, effusion and abnormal alignment. Tenderness found. Medial joint line and lateral joint line tenderness noted.  Neurological: She is alert and oriented to person, place, and time. She has normal reflexes.  Skin: Skin is warm and dry.  Psychiatric: She has a normal mood and affect. Her behavior is normal. Judgment and thought content normal.    Vital signs in last 24 hours: @VSRANGES @  Labs:   Estimated body mass index is 26.93 kg/m as calculated from the following:   Height as of 01/20/17: 5\' 3"  (1.6 m).   Weight as of 01/20/17: 68.9 kg (152 lb).   Imaging Review Plain radiographs demonstrate severe degenerative joint disease of the right knee(s). The overall alignment issignificant varus. The bone quality appears to be adequate for age and reported activity level.  Assessment/Plan:  End stage arthritis, right knee   The patient history, physical examination, clinical judgment of the provider and imaging studies are consistent with end stage degenerative joint disease of the  right knee(s) and total knee arthroplasty is deemed medically necessary. The treatment options including medical management, injection therapy arthroscopy and arthroplasty were discussed at length. The risks and benefits of total knee arthroplasty were presented and reviewed. The risks due to aseptic loosening, infection, stiffness, patella tracking problems, thromboembolic complications and other imponderables were discussed. The patient acknowledged the explanation, agreed to proceed with the plan and consent was signed. Patient is being admitted for inpatient treatment for surgery, pain control, PT, OT, prophylactic antibiotics, VTE prophylaxis, progressive ambulation and ADL's and discharge planning. The patient is planning to be discharged home with outpatient PT at [facility]. Has DME.

## 2017-04-14 ENCOUNTER — Other Ambulatory Visit (HOSPITAL_COMMUNITY): Payer: Self-pay | Admitting: Emergency Medicine

## 2017-04-14 NOTE — Progress Notes (Addendum)
ECHO 02-22-17 epic  Lov neuro Dr Delice Lesch 01-20-17 epic   Surgical clearance Dr Emi Belfast 11-18-16 on chart   ekg 10-15-16 epic   cxr 10-15-16 epic

## 2017-04-14 NOTE — Patient Instructions (Addendum)
Dominique Griffith  04/14/2017   Your procedure is scheduled on: 04-22-17   Report to [facility] Main  Entrance    Report to admitting at 1:15PM   Call this number if you have problems the morning of surgery [phone]     Remember: Do not eat food After Midnight. YOU MAY HAVE CLEAR LIQUIDS FROM MIDNIGHT UNTIL 9:45A, DAY OF SURGERY . NOTHING BY MOUTH AFTER 9:45AM !     Take these medicines the morning of surgery with A SIP OF WATER: WELLBUTRIN, ZYRTEC IF NEEDED, COLCHICINE, ESCITALOPRAM, FAMOTIDINE                              You may not have any metal on your body including hair pins and              piercings  Do not wear jewelry, make-up, lotions, powders or perfumes, deodorant             Do not wear nail polish.  Do not shave  48 hours prior to surgery.            Do not bring valuables to the hospital. [place].  Contacts, dentures or bridgework may not be worn into surgery.  Leave suitcase in the car. After surgery it may be brought to your room.                 Please read over the following fact sheets you were given: _____________________________________________________________________       CLEAR LIQUID DIET   Foods Allowed                                                                     Foods Excluded  Coffee and tea, regular and decaf                             liquids that you cannot  Plain Jell-O in any flavor                                             see through such as: Fruit ices (not with fruit pulp)                                     milk, soups, orange juice  Iced Popsicles                                    All solid food Carbonated beverages, regular and diet  Cranberry, grape and apple juices Sports drinks like Gatorade Lightly seasoned clear broth or consume(fat free) Sugar, honey syrup  Sample Menu Breakfast                                 Lunch                                     Supper Cranberry juice                    Beef broth                            Chicken broth Jell-O                                     Grape juice                           Apple juice Coffee or tea                        Jell-O                                      Popsicle                                                Coffee or tea                        Coffee or tea  _____________________________________________________________________  [facility] - Preparing for Surgery Before surgery, you can play an important role.  Because skin is not sterile, your skin needs to be as free of germs as possible.  You can reduce the number of germs on your skin by washing with CHG (chlorahexidine gluconate) soap before surgery.  CHG is an antiseptic cleaner which kills germs and bonds with the skin to continue killing germs even after washing. Please DO NOT use if you have an allergy to CHG or antibacterial soaps.  If your skin becomes reddened/irritated stop using the CHG and inform your nurse when you arrive at Short Stay. Do not shave (including legs and underarms) for at least 48 hours prior to the first CHG shower.  You may shave your face/neck. Please follow these instructions carefully:  1.  Shower with CHG Soap the night before surgery and the  morning of Surgery.  2.  If you choose to wash your hair, wash your hair first as usual with your  normal  shampoo.  3.  After you shampoo, rinse your hair and body thoroughly to remove the  shampoo.                           4.  Use CHG as you would any other liquid soap.  You can apply chg directly  to the skin and wash  Gently with a scrungie or clean washcloth.  5.  Apply the CHG Soap to your body ONLY FROM THE NECK DOWN.   Do not use on face/ open                           Wound or open sores. Avoid contact with eyes, ears mouth and genitals (private parts).                        Wash face,  Genitals (private parts) with your normal soap.             6.  Wash thoroughly, paying special attention to the area where your surgery  will be performed.  7.  Thoroughly rinse your body with warm water from the neck down.  8.  DO NOT shower/wash with your normal soap after using and rinsing off  the CHG Soap.                9.  Pat yourself dry with a clean towel.            10.  Wear clean pajamas.            11.  Place clean sheets on your bed the night of your first shower and do not  sleep with pets. Day of Surgery : Do not apply any lotions/deodorants the morning of surgery.  Please wear clean clothes to the hospital/surgery center.  FAILURE TO FOLLOW THESE INSTRUCTIONS MAY RESULT IN THE CANCELLATION OF YOUR SURGERY PATIENT SIGNATURE_________________________________  NURSE SIGNATURE__________________________________  ________________________________________________________________________   Dominique Griffith  An incentive spirometer is a tool that can help keep your lungs clear and active. This tool measures how well you are filling your lungs with each breath. Taking long deep breaths may help reverse or decrease the chance of developing breathing (pulmonary) problems (especially infection) following:  A long period of time when you are unable to move or be active. BEFORE THE PROCEDURE   If the spirometer includes an indicator to show your best effort, your nurse or respiratory therapist will set it to a desired goal.  If possible, sit up straight or lean slightly forward. Try not to slouch.  Hold the incentive spirometer in an upright position. INSTRUCTIONS FOR USE  1. Sit on the edge of your bed if possible, or sit up as far as you can in bed or on a chair. 2. Hold the incentive spirometer in an upright position. 3. Breathe out normally. 4. Place the mouthpiece in your mouth and seal your lips tightly around it. 5. Breathe in slowly and as deeply as  possible, raising the piston or the ball toward the top of the column. 6. Hold your breath for 3-5 seconds or for as long as possible. Allow the piston or ball to fall to the bottom of the column. 7. Remove the mouthpiece from your mouth and breathe out normally. 8. Rest for a few seconds and repeat Steps 1 through 7 at least 10 times every 1-2 hours when you are awake. Take your time and take a few normal breaths between deep breaths. 9. The spirometer may include an indicator to show your best effort. Use the indicator as a goal to work toward during each repetition. 10. After each set of 10 deep breaths, practice coughing to be sure your lungs are clear. If you have an incision (the cut made at the time of  surgery), support your incision when coughing by placing a pillow or rolled up towels firmly against it. Once you are able to get out of bed, walk around indoors and cough well. You may stop using the incentive spirometer when instructed by your caregiver.  RISKS AND COMPLICATIONS  Take your time so you do not get dizzy or light-headed.  If you are in pain, you may need to take or ask for pain medication before doing incentive spirometry. It is harder to take a deep breath if you are having pain. AFTER USE  Rest and breathe slowly and easily.  It can be helpful to keep track of a log of your progress. Your caregiver can provide you with a simple table to help with this. If you are using the spirometer at home, follow these instructions: [place] IF:   You are having difficultly using the spirometer.  You have trouble using the spirometer as often as instructed.  Your pain medication is not giving enough relief while using the spirometer.  You develop fever of 100.5 F (38.1 C) or higher. SEEK IMMEDIATE MEDICAL CARE IF:   You cough up bloody sputum that had not been present before.  You develop fever of 102 F (38.9 C) or greater.  You develop worsening pain at or near  the incision site. MAKE SURE YOU:   Understand these instructions.  Will watch your condition.  Will get help right away if you are not doing well or get worse. Document Released: 06/22/2006 Document Revised: 05/04/2011 Document Reviewed: 08/23/2006 Exit[employer] Patient Information 2014 Exit[employer], [place].   ________________________________________________________________________  WHAT IS A BLOOD TRANSFUSION? Blood Transfusion Information  A transfusion is the replacement of blood or some of its parts. Blood is made up of multiple cells which provide different functions.  Red blood cells carry oxygen and are used for blood loss replacement.  White blood cells fight against infection.  Platelets control bleeding.  Plasma helps clot blood.  Other blood products are available for specialized needs, such as hemophilia or other clotting disorders. BEFORE THE TRANSFUSION  Who gives blood for transfusions?   Healthy volunteers who are fully evaluated to make sure their blood is safe. This is blood bank blood. Transfusion therapy is the safest it has ever been in the practice of medicine. Before blood is taken from a donor, a complete history is taken to make sure that person has no history of diseases nor engages in risky social behavior (examples are intravenous drug use or sexual activity with multiple partners). The donor's travel history is screened to minimize risk of transmitting infections, such as malaria. The donated blood is tested for signs of infectious diseases, such as HIV and hepatitis. The blood is then tested to be sure it is compatible with you in order to minimize the chance of a transfusion reaction. If you or a relative donates blood, this is often done in anticipation of surgery and is not appropriate for emergency situations. It takes many days to process the donated blood. RISKS AND COMPLICATIONS Although transfusion therapy is very safe and saves many lives, the main  dangers of transfusion include:   Getting an infectious disease.  Developing a transfusion reaction. This is an allergic reaction to something in the blood you were given. Every precaution is taken to prevent this. The decision to have a blood transfusion has been considered carefully by your caregiver before blood is given. Blood is not given unless the benefits outweigh the risks. AFTER THE  TRANSFUSION  Right after receiving a blood transfusion, you will usually feel much better and more energetic. This is especially true if your red blood cells have gotten low (anemic). The transfusion raises the level of the red blood cells which carry oxygen, and this usually causes an energy increase.  The nurse administering the transfusion will monitor you carefully for complications. HOME CARE INSTRUCTIONS  No special instructions are needed after a transfusion. You may find your energy is better. Speak with your caregiver about any limitations on activity for underlying diseases you may have. SEEK MEDICAL CARE IF:   Your condition is not improving after your transfusion.  You develop redness or irritation at the intravenous (IV) site. SEEK IMMEDIATE MEDICAL CARE IF:  Any of the following symptoms occur over the next 12 hours:  Shaking chills.  You have a temperature by mouth above 102 F (38.9 C), not controlled by medicine.  Chest, back, or muscle pain.  People around you feel you are not acting correctly or are confused.  Shortness of breath or difficulty breathing.  Dizziness and fainting.  You get a rash or develop hives.  You have a decrease in urine output.  Your urine turns a dark color or changes to pink, red, or brown. Any of the following symptoms occur over the next 10 days:  You have a temperature by mouth above 102 F (38.9 C), not controlled by medicine.  Shortness of breath.  Weakness after normal activity.  The white part of the eye turns yellow  (jaundice).  You have a decrease in the amount of urine or are urinating less often.  Your urine turns a dark color or changes to pink, red, or brown. Document Released: 02/07/2000 Document Revised: 05/04/2011 Document Reviewed: 09/26/2007 [facility] Patient Information 2014 [place], [place].  _______________________________________________________________________

## 2017-04-16 ENCOUNTER — Encounter (HOSPITAL_COMMUNITY)
Admission: RE | Admit: 2017-04-16 | Discharge: 2017-04-16 | Disposition: A | Discharge status: Home / Self Care | Payer: PPO | Source: Ambulatory Visit | Attending: Orthopedic Surgery | Admitting: Orthopedic Surgery

## 2017-04-16 ENCOUNTER — Encounter (HOSPITAL_COMMUNITY): Payer: Self-pay

## 2017-04-16 ENCOUNTER — Other Ambulatory Visit: Payer: Self-pay

## 2017-04-16 DIAGNOSIS — Z01812 Encounter for preprocedural laboratory examination: Secondary | ICD-10-CM | POA: Diagnosis not present

## 2017-04-16 DIAGNOSIS — Z79899 Other long term (current) drug therapy: Secondary | ICD-10-CM | POA: Diagnosis not present

## 2017-04-16 DIAGNOSIS — K219 Gastro-esophageal reflux disease without esophagitis: Secondary | ICD-10-CM | POA: Insufficient documentation

## 2017-04-16 DIAGNOSIS — I10 Essential (primary) hypertension: Secondary | ICD-10-CM | POA: Insufficient documentation

## 2017-04-16 DIAGNOSIS — F329 Major depressive disorder, single episode, unspecified: Secondary | ICD-10-CM | POA: Diagnosis not present

## 2017-04-16 DIAGNOSIS — F419 Anxiety disorder, unspecified: Secondary | ICD-10-CM | POA: Diagnosis not present

## 2017-04-16 DIAGNOSIS — Z87891 Personal history of nicotine dependence: Secondary | ICD-10-CM | POA: Insufficient documentation

## 2017-04-16 HISTORY — DX: Major depressive disorder, single episode, unspecified: F32.9

## 2017-04-16 HISTORY — DX: Adverse effect of unspecified anesthetic, initial encounter: T41.45XA

## 2017-04-16 HISTORY — DX: Depression, unspecified: F32.A

## 2017-04-16 HISTORY — DX: Anxiety disorder, unspecified: F41.9

## 2017-04-16 HISTORY — DX: Other complications of anesthesia, initial encounter: T88.59XA

## 2017-04-16 LAB — CBC
HEMATOCRIT: 40.3 % (ref 36.0–46.0)
Hemoglobin: 13.7 g/dL (ref 12.0–15.0)
MCH: 32.3 pg (ref 26.0–34.0)
MCHC: 34 g/dL (ref 30.0–36.0)
MCV: 95 fL (ref 78.0–100.0)
Platelets: 272 10*3/uL (ref 150–400)
RBC: 4.24 MIL/uL (ref 3.87–5.11)
RDW: 13.5 % (ref 11.5–15.5)
WBC: 6.6 10*3/uL (ref 4.0–10.5)

## 2017-04-16 LAB — BASIC METABOLIC PANEL
ANION GAP: 11 (ref 5–15)
BUN: 13 mg/dL (ref 6–20)
CALCIUM: 10 mg/dL (ref 8.9–10.3)
CO2: 26 mmol/L (ref 22–32)
Chloride: 99 mmol/L — ABNORMAL LOW (ref 101–111)
Creatinine, Ser: 0.88 mg/dL (ref 0.44–1.00)
GFR calc Af Amer: >60 mL/min (ref >60)
GFR calc non Af Amer: >60 mL/min (ref >60)
GLUCOSE: 92 mg/dL (ref 65–99)
Potassium: 5.3 mmol/L — ABNORMAL HIGH (ref 3.5–5.1)
Sodium: 136 mmol/L (ref 135–145)

## 2017-04-16 LAB — SURGICAL PCR SCREEN
MRSA, PCR: NEGATIVE
STAPHYLOCOCCUS AUREUS: NEGATIVE

## 2017-04-16 LAB — ABO/RH: ABO/RH(D): O POS

## 2017-04-21 MED ORDER — TRANEXAMIC ACID 1000 MG/10ML IV SOLN
1000.0000 mg | INTRAVENOUS | Status: AC
  Administered 2017-04-22: 1000 mg via INTRAVENOUS
  Filled 2017-04-21: qty 1100

## 2017-04-22 ENCOUNTER — Inpatient Hospital Stay (HOSPITAL_COMMUNITY): Payer: PPO | Admitting: Anesthesiology

## 2017-04-22 ENCOUNTER — Other Ambulatory Visit: Payer: Self-pay

## 2017-04-22 ENCOUNTER — Encounter (HOSPITAL_COMMUNITY)
Admission: RE | Disposition: A | Discharge status: Home / Self Care | Payer: Self-pay | Source: Ambulatory Visit | Attending: Orthopedic Surgery

## 2017-04-22 ENCOUNTER — Encounter (HOSPITAL_COMMUNITY): Payer: Self-pay | Admitting: *Deleted

## 2017-04-22 ENCOUNTER — Inpatient Hospital Stay (HOSPITAL_COMMUNITY)
Admission: RE | Admit: 2017-04-22 | Discharge: 2017-04-24 | DRG: 470 | Disposition: A | Discharge status: Home / Self Care | Payer: PPO | Source: Ambulatory Visit | Attending: Orthopedic Surgery | Admitting: Orthopedic Surgery

## 2017-04-22 ENCOUNTER — Inpatient Hospital Stay (HOSPITAL_COMMUNITY): Payer: PPO

## 2017-04-22 DIAGNOSIS — Z96651 Presence of right artificial knee joint: Secondary | ICD-10-CM | POA: Diagnosis not present

## 2017-04-22 DIAGNOSIS — I10 Essential (primary) hypertension: Secondary | ICD-10-CM | POA: Diagnosis present

## 2017-04-22 DIAGNOSIS — M1711 Unilateral primary osteoarthritis, right knee: Principal | ICD-10-CM | POA: Diagnosis present

## 2017-04-22 DIAGNOSIS — Z791 Long term (current) use of non-steroidal anti-inflammatories (NSAID): Secondary | ICD-10-CM

## 2017-04-22 DIAGNOSIS — K219 Gastro-esophageal reflux disease without esophagitis: Secondary | ICD-10-CM | POA: Diagnosis not present

## 2017-04-22 DIAGNOSIS — F329 Major depressive disorder, single episode, unspecified: Secondary | ICD-10-CM | POA: Diagnosis present

## 2017-04-22 DIAGNOSIS — M25561 Pain in right knee: Secondary | ICD-10-CM | POA: Diagnosis present

## 2017-04-22 DIAGNOSIS — F419 Anxiety disorder, unspecified: Secondary | ICD-10-CM | POA: Diagnosis present

## 2017-04-22 DIAGNOSIS — Z471 Aftercare following joint replacement surgery: Secondary | ICD-10-CM | POA: Diagnosis not present

## 2017-04-22 DIAGNOSIS — Z79899 Other long term (current) drug therapy: Secondary | ICD-10-CM

## 2017-04-22 DIAGNOSIS — G8918 Other acute postprocedural pain: Secondary | ICD-10-CM | POA: Diagnosis not present

## 2017-04-22 HISTORY — PX: KNEE ARTHROPLASTY: SHX992

## 2017-04-22 LAB — TYPE AND SCREEN
ABO/RH(D): O POS
Antibody Screen: NEGATIVE

## 2017-04-22 SURGERY — ARTHROPLASTY, KNEE, TOTAL, USING IMAGELESS COMPUTER-ASSISTED NAVIGATION
Anesthesia: Spinal | Site: Knee | Laterality: Right

## 2017-04-22 MED ORDER — SODIUM CHLORIDE 0.9 % IV SOLN: INTRAVENOUS | Status: DC

## 2017-04-22 MED ORDER — SODIUM CHLORIDE 0.9 % IJ SOLN
INTRAMUSCULAR | Status: DC | PRN
  Administered 2017-04-22: 50 mL

## 2017-04-22 MED ORDER — ACETAMINOPHEN 325 MG PO TABS: 325.0000 mg | ORAL_TABLET | Freq: Four times a day (QID) | ORAL | Status: DC | PRN

## 2017-04-22 MED ORDER — PROPOFOL 500 MG/50ML IV EMUL
INTRAVENOUS | Status: DC | PRN
  Administered 2017-04-22: 100 ug/kg/min via INTRAVENOUS

## 2017-04-22 MED ORDER — ISOPROPYL ALCOHOL 70 % SOLN
Status: AC
  Filled 2017-04-22: qty 480

## 2017-04-22 MED ORDER — ISOPROPYL ALCOHOL 70 % SOLN
Status: DC | PRN
  Administered 2017-04-22: 1 via TOPICAL

## 2017-04-22 MED ORDER — CLINDAMYCIN PHOSPHATE 900 MG/50ML IV SOLN
INTRAVENOUS | Status: AC
  Filled 2017-04-22: qty 50

## 2017-04-22 MED ORDER — BUPROPION HCL ER (XL) 150 MG PO TB24
150.0000 mg | ORAL_TABLET | Freq: Every day | ORAL | Status: DC
  Administered 2017-04-23 – 2017-04-24 (×2): 150 mg via ORAL
  Filled 2017-04-22 (×2): qty 1

## 2017-04-22 MED ORDER — LACTATED RINGERS IV SOLN: INTRAVENOUS | Status: DC

## 2017-04-22 MED ORDER — ONDANSETRON HCL 4 MG PO TABS: 4.0000 mg | ORAL_TABLET | Freq: Four times a day (QID) | ORAL | Status: DC | PRN

## 2017-04-22 MED ORDER — HYDROCODONE-ACETAMINOPHEN 7.5-325 MG PO TABS
1.0000 | ORAL_TABLET | ORAL | Status: DC | PRN
  Administered 2017-04-22 – 2017-04-24 (×3): 1 via ORAL
  Administered 2017-04-24 (×2): 2 via ORAL
  Filled 2017-04-22: qty 1
  Filled 2017-04-22: qty 2
  Filled 2017-04-22: qty 1
  Filled 2017-04-22: qty 2
  Filled 2017-04-22: qty 1

## 2017-04-22 MED ORDER — PHENYLEPHRINE HCL 10 MG/ML IJ SOLN
INTRAMUSCULAR | Status: AC
  Filled 2017-04-22: qty 1

## 2017-04-22 MED ORDER — DOCUSATE SODIUM 100 MG PO CAPS
100.0000 mg | ORAL_CAPSULE | Freq: Two times a day (BID) | ORAL | Status: DC
  Administered 2017-04-22 – 2017-04-24 (×4): 100 mg via ORAL
  Filled 2017-04-22 (×4): qty 1

## 2017-04-22 MED ORDER — MORPHINE SULFATE (PF) 2 MG/ML IV SOLN: 0.5000 mg | INTRAVENOUS | Status: DC | PRN

## 2017-04-22 MED ORDER — BUPIVACAINE-EPINEPHRINE 0.25% -1:200000 IJ SOLN
INTRAMUSCULAR | Status: DC | PRN
  Administered 2017-04-22: 30 mL

## 2017-04-22 MED ORDER — ASPIRIN 81 MG PO CHEW
81.0000 mg | CHEWABLE_TABLET | Freq: Two times a day (BID) | ORAL | Status: DC
  Administered 2017-04-23 – 2017-04-24 (×3): 81 mg via ORAL
  Filled 2017-04-22 (×3): qty 1

## 2017-04-22 MED ORDER — DEXTROSE 5 % IV SOLN
INTRAVENOUS | Status: DC | PRN
  Administered 2017-04-22: 10 ug/min via INTRAVENOUS

## 2017-04-22 MED ORDER — HYDROMORPHONE HCL 1 MG/ML IJ SOLN: 0.2500 mg | INTRAMUSCULAR | Status: DC | PRN

## 2017-04-22 MED ORDER — ALBUTEROL SULFATE (2.5 MG/3ML) 0.083% IN NEBU: 3.0000 mL | INHALATION_SOLUTION | Freq: Four times a day (QID) | RESPIRATORY_TRACT | Status: DC | PRN

## 2017-04-22 MED ORDER — HYDROCODONE-ACETAMINOPHEN 5-325 MG PO TABS
1.0000 | ORAL_TABLET | ORAL | Status: DC | PRN
  Administered 2017-04-23: 2 via ORAL
  Administered 2017-04-23 (×2): 1 via ORAL
  Administered 2017-04-23 (×2): 2 via ORAL
  Filled 2017-04-22: qty 1
  Filled 2017-04-22 (×4): qty 2

## 2017-04-22 MED ORDER — KETOROLAC TROMETHAMINE 30 MG/ML IJ SOLN
INTRAMUSCULAR | Status: AC
  Filled 2017-04-22: qty 1

## 2017-04-22 MED ORDER — POLYETHYLENE GLYCOL 3350 17 G PO PACK: 17.0000 g | PACK | Freq: Every day | ORAL | Status: DC | PRN

## 2017-04-22 MED ORDER — DIPHENHYDRAMINE HCL 12.5 MG/5ML PO ELIX: 12.5000 mg | ORAL_SOLUTION | ORAL | Status: DC | PRN

## 2017-04-22 MED ORDER — METHOCARBAMOL 500 MG PO TABS
500.0000 mg | ORAL_TABLET | Freq: Four times a day (QID) | ORAL | Status: DC | PRN
  Administered 2017-04-23 – 2017-04-24 (×2): 500 mg via ORAL
  Filled 2017-04-22 (×3): qty 1

## 2017-04-22 MED ORDER — ONDANSETRON HCL 4 MG/2ML IJ SOLN: 4.0000 mg | Freq: Four times a day (QID) | INTRAMUSCULAR | Status: DC | PRN

## 2017-04-22 MED ORDER — LOSARTAN POTASSIUM 50 MG PO TABS
100.0000 mg | ORAL_TABLET | Freq: Every day | ORAL | Status: DC
  Filled 2017-04-22 (×2): qty 2

## 2017-04-22 MED ORDER — MIDAZOLAM HCL 2 MG/2ML IJ SOLN
1.0000 mg | INTRAMUSCULAR | Status: DC | PRN
  Filled 2017-04-22: qty 2

## 2017-04-22 MED ORDER — SODIUM CHLORIDE 0.9 % IR SOLN
Status: DC | PRN
  Administered 2017-04-22: 4000 mL

## 2017-04-22 MED ORDER — BUPIVACAINE HCL (PF) 0.25 % IJ SOLN
INTRAMUSCULAR | Status: AC
  Filled 2017-04-22: qty 30

## 2017-04-22 MED ORDER — LORATADINE 10 MG PO TABS
10.0000 mg | ORAL_TABLET | Freq: Every day | ORAL | Status: DC
Start: 2017-04-23 — End: 2017-04-24
  Administered 2017-04-23 – 2017-04-24 (×2): 10 mg via ORAL
  Filled 2017-04-22 (×2): qty 1

## 2017-04-22 MED ORDER — ALUM & MAG HYDROXIDE-SIMETH 200-200-20 MG/5ML PO SUSP: 30.0000 mL | ORAL | Status: DC | PRN

## 2017-04-22 MED ORDER — ONDANSETRON HCL 4 MG/2ML IJ SOLN
INTRAMUSCULAR | Status: AC
  Filled 2017-04-22: qty 2

## 2017-04-22 MED ORDER — FAMOTIDINE 20 MG PO TABS
40.0000 mg | ORAL_TABLET | Freq: Two times a day (BID) | ORAL | Status: DC
  Administered 2017-04-22 – 2017-04-24 (×4): 40 mg via ORAL
  Filled 2017-04-22 (×4): qty 2

## 2017-04-22 MED ORDER — ONDANSETRON HCL 4 MG/2ML IJ SOLN
INTRAMUSCULAR | Status: DC | PRN
  Administered 2017-04-22: 4 mg via INTRAVENOUS

## 2017-04-22 MED ORDER — CLINDAMYCIN PHOSPHATE 900 MG/50ML IV SOLN
900.0000 mg | INTRAVENOUS | Status: AC
  Administered 2017-04-22: 900 mg via INTRAVENOUS
  Filled 2017-04-22: qty 50

## 2017-04-22 MED ORDER — ROPIVACAINE HCL 7.5 MG/ML IJ SOLN
INTRAMUSCULAR | Status: DC | PRN
  Administered 2017-04-22: 20 mL via PERINEURAL

## 2017-04-22 MED ORDER — ACETAMINOPHEN 10 MG/ML IV SOLN
1000.0000 mg | INTRAVENOUS | Status: AC
  Administered 2017-04-22: 1000 mg via INTRAVENOUS
  Filled 2017-04-22: qty 100

## 2017-04-22 MED ORDER — NITROGLYCERIN 2 % TD OINT
0.5000 [in_us] | TOPICAL_OINTMENT | Freq: Every day | TRANSDERMAL | Status: DC | PRN
Start: 2017-04-22 — End: 2017-04-24
  Filled 2017-04-22: qty 0.5

## 2017-04-22 MED ORDER — PHENYLEPHRINE 40 MCG/ML (10ML) SYRINGE FOR IV PUSH (FOR BLOOD PRESSURE SUPPORT)
PREFILLED_SYRINGE | INTRAVENOUS | Status: DC | PRN
  Administered 2017-04-22 (×4): 80 ug via INTRAVENOUS

## 2017-04-22 MED ORDER — METOCLOPRAMIDE HCL 5 MG/ML IJ SOLN: 5.0000 mg | Freq: Three times a day (TID) | INTRAMUSCULAR | Status: DC | PRN

## 2017-04-22 MED ORDER — CLINDAMYCIN PHOSPHATE 600 MG/50ML IV SOLN
600.0000 mg | Freq: Four times a day (QID) | INTRAVENOUS | Status: AC
  Administered 2017-04-22 – 2017-04-23 (×2): 600 mg via INTRAVENOUS
  Filled 2017-04-22 (×2): qty 50

## 2017-04-22 MED ORDER — DEXAMETHASONE SODIUM PHOSPHATE 10 MG/ML IJ SOLN
INTRAMUSCULAR | Status: AC
  Filled 2017-04-22: qty 1

## 2017-04-22 MED ORDER — PROPOFOL 10 MG/ML IV BOLUS
INTRAVENOUS | Status: AC
  Filled 2017-04-22: qty 40

## 2017-04-22 MED ORDER — PROPOFOL 10 MG/ML IV BOLUS
INTRAVENOUS | Status: DC | PRN
  Administered 2017-04-22: 40 mg via INTRAVENOUS

## 2017-04-22 MED ORDER — DEXAMETHASONE SODIUM PHOSPHATE 10 MG/ML IJ SOLN
10.0000 mg | Freq: Once | INTRAMUSCULAR | Status: AC
  Administered 2017-04-23: 10 mg via INTRAVENOUS
  Filled 2017-04-22: qty 1

## 2017-04-22 MED ORDER — MEPERIDINE HCL 50 MG/ML IJ SOLN: 6.2500 mg | INTRAMUSCULAR | Status: DC | PRN

## 2017-04-22 MED ORDER — SODIUM CHLORIDE 0.9 % IJ SOLN
INTRAMUSCULAR | Status: AC
  Filled 2017-04-22: qty 50

## 2017-04-22 MED ORDER — DEXAMETHASONE SODIUM PHOSPHATE 10 MG/ML IJ SOLN
INTRAMUSCULAR | Status: DC | PRN
  Administered 2017-04-22: 10 mg via INTRAVENOUS

## 2017-04-22 MED ORDER — SODIUM CHLORIDE 0.9 % IV SOLN
INTRAVENOUS | Status: DC
  Administered 2017-04-22: 22:00:00 via INTRAVENOUS

## 2017-04-22 MED ORDER — METHOCARBAMOL 1000 MG/10ML IJ SOLN
500.0000 mg | Freq: Four times a day (QID) | INTRAVENOUS | Status: DC | PRN
  Filled 2017-04-22: qty 5

## 2017-04-22 MED ORDER — KETOROLAC TROMETHAMINE 15 MG/ML IJ SOLN
7.5000 mg | Freq: Four times a day (QID) | INTRAMUSCULAR | Status: AC
  Administered 2017-04-23 (×2): 7.5 mg via INTRAVENOUS
  Filled 2017-04-22 (×2): qty 1

## 2017-04-22 MED ORDER — ESCITALOPRAM OXALATE 20 MG PO TABS
20.0000 mg | ORAL_TABLET | Freq: Every day | ORAL | Status: DC
  Administered 2017-04-23 – 2017-04-24 (×2): 20 mg via ORAL
  Filled 2017-04-22 (×2): qty 1

## 2017-04-22 MED ORDER — KETOROLAC TROMETHAMINE 30 MG/ML IJ SOLN
INTRAMUSCULAR | Status: DC | PRN
  Administered 2017-04-22: 30 mg

## 2017-04-22 MED ORDER — PROMETHAZINE HCL 25 MG/ML IJ SOLN: 6.2500 mg | INTRAMUSCULAR | Status: DC | PRN

## 2017-04-22 MED ORDER — COLCHICINE 0.6 MG PO TABS
0.6000 mg | ORAL_TABLET | Freq: Every day | ORAL | Status: DC
  Administered 2017-04-23 – 2017-04-24 (×2): 0.6 mg via ORAL
  Filled 2017-04-22 (×2): qty 1

## 2017-04-22 MED ORDER — CHLORHEXIDINE GLUCONATE 4 % EX LIQD
60.0000 mL | Freq: Once | CUTANEOUS | Status: AC
  Administered 2017-04-22: 4 via TOPICAL

## 2017-04-22 MED ORDER — PHENOL 1.4 % MT LIQD
1.0000 | OROMUCOSAL | Status: DC | PRN
  Filled 2017-04-22: qty 177

## 2017-04-22 MED ORDER — MENTHOL 3 MG MT LOZG: 1.0000 | LOZENGE | OROMUCOSAL | Status: DC | PRN

## 2017-04-22 MED ORDER — METOCLOPRAMIDE HCL 5 MG PO TABS: 5.0000 mg | ORAL_TABLET | Freq: Three times a day (TID) | ORAL | Status: DC | PRN

## 2017-04-22 MED ORDER — 0.9 % SODIUM CHLORIDE (POUR BTL) OPTIME
TOPICAL | Status: DC | PRN
  Administered 2017-04-22: 1000 mL

## 2017-04-22 MED ORDER — BACLOFEN 10 MG PO TABS
10.0000 mg | ORAL_TABLET | Freq: Three times a day (TID) | ORAL | Status: DC
  Administered 2017-04-22 – 2017-04-23 (×4): 10 mg via ORAL
  Filled 2017-04-22 (×5): qty 1

## 2017-04-22 MED ORDER — FENTANYL CITRATE (PF) 100 MCG/2ML IJ SOLN
50.0000 ug | INTRAMUSCULAR | Status: DC | PRN
  Administered 2017-04-22: 50 ug via INTRAVENOUS
  Filled 2017-04-22: qty 2

## 2017-04-22 MED ORDER — POVIDONE-IODINE 10 % EX SWAB
2.0000 "application " | Freq: Once | CUTANEOUS | Status: AC
  Administered 2017-04-22: 2 via TOPICAL

## 2017-04-22 MED ORDER — LACTATED RINGERS IV SOLN
INTRAVENOUS | Status: DC
  Administered 2017-04-22 (×2): via INTRAVENOUS

## 2017-04-22 MED ORDER — BUPIVACAINE IN DEXTROSE 0.75-8.25 % IT SOLN
INTRATHECAL | Status: DC | PRN
  Administered 2017-04-22: 1.8 mL via INTRATHECAL

## 2017-04-22 MED ORDER — AMITRIPTYLINE HCL 25 MG PO TABS
25.0000 mg | ORAL_TABLET | Freq: Every day | ORAL | Status: DC
Start: 2017-04-22 — End: 2017-04-24
  Administered 2017-04-22 – 2017-04-23 (×2): 25 mg via ORAL
  Filled 2017-04-22 (×2): qty 1

## 2017-04-22 SURGICAL SUPPLY — 54 items
BAG ZIPLOCK 12X15 (MISCELLANEOUS) ×2 IMPLANT
BANDAGE ACE 4X5 VEL STRL LF (GAUZE/BANDAGES/DRESSINGS) ×2 IMPLANT
BANDAGE ACE 6X5 VEL STRL LF (GAUZE/BANDAGES/DRESSINGS) ×2 IMPLANT
BLADE SAW RECIPROCATING 77.5 (BLADE) ×2 IMPLANT
CAPT KNEE TRIATH TK-4 ×2 IMPLANT
CHLORAPREP W/TINT 26ML (MISCELLANEOUS) ×4 IMPLANT
COVER SURGICAL LIGHT HANDLE (MISCELLANEOUS) ×2 IMPLANT
CUFF TOURN SGL QUICK 34 (TOURNIQUET CUFF) ×1
CUFF TRNQT CYL 34X4X40X1 (TOURNIQUET CUFF) ×1 IMPLANT
DECANTER SPIKE VIAL GLASS SM (MISCELLANEOUS) ×4 IMPLANT
DERMABOND ADVANCED (GAUZE/BANDAGES/DRESSINGS) ×2
DERMABOND ADVANCED .7 DNX12 (GAUZE/BANDAGES/DRESSINGS) ×2 IMPLANT
DRAPE SHEET LG 3/4 BI-LAMINATE (DRAPES) ×4 IMPLANT
DRAPE U-SHAPE 47X51 STRL (DRAPES) ×2 IMPLANT
DRSG AQUACEL AG ADV 3.5X10 (GAUZE/BANDAGES/DRESSINGS) ×2 IMPLANT
DRSG TEGADERM 4X4.75 (GAUZE/BANDAGES/DRESSINGS) IMPLANT
ELECT BLADE TIP CTD 4 INCH (ELECTRODE) ×2 IMPLANT
ELECT REM PT RETURN 15FT ADLT (MISCELLANEOUS) ×2 IMPLANT
EVACUATOR 1/8 PVC DRAIN (DRAIN) IMPLANT
GAUZE SPONGE 4X4 12PLY STRL (GAUZE/BANDAGES/DRESSINGS) ×2 IMPLANT
GLOVE BIO SURGEON STRL SZ8.5 (GLOVE) ×4 IMPLANT
GLOVE BIOGEL PI IND STRL 8.5 (GLOVE) ×1 IMPLANT
GLOVE BIOGEL PI INDICATOR 8.5 (GLOVE) ×1
GOWN SPEC L3 XXLG W/TWL (GOWN DISPOSABLE) ×2 IMPLANT
HANDPIECE INTERPULSE COAX TIP (DISPOSABLE) ×1
HOOD PEEL AWAY FLYTE STAYCOOL (MISCELLANEOUS) ×10 IMPLANT
MARKER SKIN DUAL TIP RULER LAB (MISCELLANEOUS) ×2 IMPLANT
NEEDLE SPNL 18GX3.5 QUINCKE PK (NEEDLE) ×2 IMPLANT
NS IRRIG 1000ML POUR BTL (IV SOLUTION) ×2 IMPLANT
PACK TOTAL KNEE CUSTOM (KITS) ×2 IMPLANT
PADDING CAST COTTON 6X4 STRL (CAST SUPPLIES) ×2 IMPLANT
POSITIONER SURGICAL ARM (MISCELLANEOUS) ×2 IMPLANT
SAW OSC TIP CART 19.5X105X1.3 (SAW) ×2 IMPLANT
SEALER BIPOLAR AQUA 6.0 (INSTRUMENTS) ×2 IMPLANT
SET HNDPC FAN SPRY TIP SCT (DISPOSABLE) ×1 IMPLANT
SET PAD KNEE POSITIONER (MISCELLANEOUS) ×2 IMPLANT
SPONGE DRAIN TRACH 4X4 STRL 2S (GAUZE/BANDAGES/DRESSINGS) IMPLANT
SPONGE LAP 18X18 X RAY DECT (DISPOSABLE) IMPLANT
STAPLER VISISTAT 35W (STAPLE) ×2 IMPLANT
SUCTION FRAZIER HANDLE 12FR (TUBING) ×1
SUCTION TUBE FRAZIER 12FR DISP (TUBING) ×1 IMPLANT
SUT MNCRL AB 3-0 PS2 18 (SUTURE) ×2 IMPLANT
SUT MON AB 2-0 CT1 36 (SUTURE) ×4 IMPLANT
SUT STRATAFIX PDO 1 14 VIOLET (SUTURE) ×1
SUT STRATFX PDO 1 14 VIOLET (SUTURE) ×1
SUT VIC AB 1 CT1 36 (SUTURE) ×6 IMPLANT
SUT VIC AB 2-0 CT1 27 (SUTURE) ×1
SUT VIC AB 2-0 CT1 TAPERPNT 27 (SUTURE) ×1 IMPLANT
SUTURE STRATFX PDO 1 14 VIOLET (SUTURE) ×1 IMPLANT
SYR 50ML LL SCALE MARK (SYRINGE) ×2 IMPLANT
TOWER CARTRIDGE SMART MIX (DISPOSABLE) IMPLANT
TRAY FOLEY W/METER SILVER 16FR (SET/KITS/TRAYS/PACK) ×2 IMPLANT
WRAP KNEE MAXI GEL POST OP (GAUZE/BANDAGES/DRESSINGS) ×2 IMPLANT
YANKAUER SUCT BULB TIP 10FT TU (MISCELLANEOUS) ×2 IMPLANT

## 2017-04-22 NOTE — Anesthesia Postprocedure Evaluation (Signed)
Anesthesia Post Note  Patient: Dominique Griffith  Procedure(s) Performed: RIGHT TOTAL KNEE ARTHROPLASTY WITH COMPUTER NAVIGATION (Right Knee)     Patient location during evaluation: PACU Anesthesia Type: Spinal Level of consciousness: oriented and awake and alert Pain management: pain level controlled Vital Signs Assessment: post-procedure vital signs reviewed and stable Respiratory status: spontaneous breathing, respiratory function stable and patient connected to nasal cannula oxygen Cardiovascular status: blood pressure returned to baseline and stable Postop Assessment: no headache, no backache, no apparent nausea or vomiting and spinal receding Anesthetic complications: no    Last Vitals:  Vitals:   04/22/17 2015 04/22/17 2030  BP: (!) 126/58 116/72  Pulse: 78 69  Resp: 20 16  Temp:    SpO2: 99% 96%    Last Pain:  Vitals:   04/22/17 1300  TempSrc: Oral    LLE Motor Response: Purposeful movement;Responds to commands (04/22/17 2030) LLE Sensation: Numbness;No sensation (absent) (04/22/17 2030) RLE Motor Response: Purposeful movement;Responds to commands (04/22/17 2030) RLE Sensation: Numbness;No sensation (absent) (04/22/17 2030) L Sensory Level: L2-Upper inner thigh, upper buttock (04/22/17 2030) R Sensory Level: L2-Upper inner thigh, upper buttock (04/22/17 2030)  Effie Berkshire

## 2017-04-22 NOTE — Progress Notes (Signed)
Assisted Dr. Smith Robert with right, ultrasound guided, adductor canal block. Side rails up, monitors on throughout procedure. See vital signs in flow sheet. Tolerated procedure well.

## 2017-04-22 NOTE — Anesthesia Procedure Notes (Signed)
Anesthesia Regional Block: Adductor canal block   Pre-Anesthetic Checklist: ,, timeout performed, Correct Patient, Correct Site, Correct Laterality, Correct Procedure, Correct Position, site marked, Risks and benefits discussed,  Surgical consent,  Pre-op evaluation,  At surgeon's request and post-op pain management  Laterality: Right  Prep: chloraprep       Needles:  Injection technique: Single-shot  Needle Type: Echogenic Needle     Needle Length: 9cm  Needle Gauge: 21     Additional Needles:   Procedures:,,,, ultrasound used (permanent image in chart),,,,  Narrative:  Start time: 04/22/2017 3:40 PM End time: 04/22/2017 3:50 PM Injection made incrementally with aspirations every 5 mL.  Performed by: Personally  Anesthesiologist: Effie Berkshire, MD  Additional Notes: Patient tolerated the procedure well. Local anesthetic introduced in an incremental fashion under minimal resistance after negative aspirations. No paresthesias were elicited. After completion of the procedure, no acute issues were identified and patient continued to be monitored by RN.

## 2017-04-22 NOTE — Discharge Instructions (Signed)
° °Dr. Mohannad Olivero °Total Joint Specialist °[facility] °[address] °[place], [place] [address] °[phone] ° °TOTAL KNEE REPLACEMENT POSTOPERATIVE DIRECTIONS ° ° ° °Knee Rehabilitation, Guidelines Following Surgery  °Results after knee surgery are often greatly improved when you follow the exercise, range of motion and muscle strengthening exercises prescribed by your doctor. Safety measures are also important to protect the knee from further injury. Any time any of these exercises cause you to have increased pain or swelling in your knee joint, decrease the amount until you are comfortable again and slowly increase them. If you have problems or questions, call your caregiver or physical therapist for advice.  ° °WEIGHT BEARING °Weight bearing as tolerated with assist device (walker, cane, etc) as directed, use it as long as suggested by your surgeon or therapist, typically at least 4-6 weeks. ° °HOME CARE INSTRUCTIONS  °Remove items at home which could result in a fall. This includes throw rugs or furniture in walking pathways.  °Continue medications as instructed at time of discharge. °You may have some home medications which will be placed on hold until you complete the course of blood thinner medication.  °You may start showering once you are discharged home but do not submerge the incision under water. Just pat the incision dry and apply a dry gauze dressing on daily. °Walk with walker as instructed.  °You may resume a sexual relationship in one month or when given the OK by your doctor.  °· Use walker as long as suggested by your caregivers. °· Avoid periods of inactivity such as sitting longer than an hour when not asleep. This helps prevent blood clots.  °You may put full weight on your legs and walk as much as is comfortable.  °You may return to work once you are cleared by your doctor.  °Do not drive a car for 6 weeks or until released by you surgeon.  °· Do not drive  while taking narcotics.  °Wear the elastic stockings for three weeks following surgery during the day but you may remove then at night. °Make sure you keep all of your appointments after your operation with all of your doctors and caregivers. You should call the office at the above phone number and make an appointment for approximately two weeks after the date of your surgery. °Do not remove your surgical dressing. The dressing is waterproof; you may take showers in 3 days, but do not take tub baths or submerge the dressing. °Please pick up a stool softener and laxative for home use as long as you are requiring pain medications. °· ICE to the affected knee every three hours for 30 minutes at a time and then as needed for pain and swelling.  Continue to use ice on the knee for pain and swelling from surgery. You may notice swelling that will progress down to the foot and ankle.  This is normal after surgery.  Elevate the leg when you are not up walking on it.   °It is important for you to complete the blood thinner medication as prescribed by your doctor. °· Continue to use the breathing machine which will help keep your temperature down.  It is common for your temperature to cycle up and down following surgery, especially at night when you are not up moving around and exerting yourself.  The breathing machine keeps your lungs expanded and your temperature down. ° °RANGE OF MOTION AND STRENGTHENING EXERCISES  °Rehabilitation of the knee is important following   a knee injury or an operation. After just a few days of immobilization, the muscles of the thigh which control the knee become weakened and shrink (atrophy). Knee exercises are designed to build up the tone and strength of the thigh muscles and to improve knee motion. Often times heat used for twenty to thirty minutes before working out will loosen up your tissues and help with improving the range of motion but do not use heat for the first two weeks following  surgery. These exercises can be done on a training (exercise) mat, on the floor, on a table or on a bed. Use what ever works the best and is most comfortable for you Knee exercises include:  °Leg Lifts - While your knee is still immobilized in a splint or cast, you can do straight leg raises. Lift the leg to 60 degrees, hold for 3 sec, and slowly lower the leg. Repeat 10-20 times 2-3 times daily. Perform this exercise against resistance later as your knee gets better.  °Quad and Hamstring Sets - Tighten up the muscle on the front of the thigh (Quad) and hold for 5-10 sec. Repeat this 10-20 times hourly. Hamstring sets are done by pushing the foot backward against an object and holding for 5-10 sec. Repeat as with quad sets.  °A rehabilitation program following serious knee injuries can speed recovery and prevent re-injury in the future due to weakened muscles. Contact your doctor or a physical therapist for more information on knee rehabilitation.  ° °SKILLED REHAB INSTRUCTIONS: °If the patient is transferred to a skilled rehab facility following release from the hospital, a list of the current medications will be sent to the facility for the patient to continue.  When discharged from the skilled rehab facility, please have the facility set up the patient's Home Health Physical Therapy prior to being released. Also, the skilled facility will be responsible for providing the patient with their medications at time of release from the facility to include their pain medication, the muscle relaxants, and their blood thinner medication. If the patient is still at the rehab facility at time of the two week follow up appointment, the skilled rehab facility will also need to assist the patient in arranging follow up appointment in our office and any transportation needs. ° °MAKE SURE YOU:  °Understand these instructions.  °Will watch your condition.  °Will get help right away if you are not doing well or get worse.   ° ° °Pick up stool softner and laxative for home use following surgery while on pain medications. °Do NOT remove your dressing. You may shower.  °Do not take tub baths or submerge incision under water. °May shower starting three days after surgery. °Please use a clean towel to pat the incision dry following showers. °Continue to use ice for pain and swelling after surgery. °Do not use any lotions or creams on the incision until instructed by your surgeon. ° °

## 2017-04-22 NOTE — Interval H&P Note (Signed)
History and Physical Interval Note:  04/22/2017 2:40 PM  Dominique Griffith  has presented today for surgery, with the diagnosis of Right knee osteoarthritis  The various methods of treatment have been discussed with the patient and family. After consideration of risks, benefits and other options for treatment, the patient has consented to  Procedure(s) with comments: RIGHT TOTAL KNEE ARTHROPLASTY WITH COMPUTER NAVIGATION (Right) - Needs RNFA as a surgical intervention .  The patient's history has been reviewed, patient examined, no change in status, stable for surgery.  I have reviewed the patient's chart and labs.  Questions were answered to the patient's satisfaction.     Hilton Cork Alantis Bethune

## 2017-04-22 NOTE — Anesthesia Procedure Notes (Signed)
Spinal  Patient location during procedure: OR Start time: 04/22/2017 5:58 PM End time: 04/22/2017 6:00 PM Staffing Anesthesiologist: Effie Berkshire, MD Performed: anesthesiologist  Preanesthetic Checklist Completed: patient identified, site marked, surgical consent, pre-op evaluation, timeout performed, IV checked, risks and benefits discussed and monitors and equipment checked Spinal Block Patient position: sitting Prep: DuraPrep Patient monitoring: heart rate, continuous pulse ox, blood pressure and cardiac monitor Approach: midline Location: L2-3 Injection technique: single-shot Needle Needle type: Introducer and Pencan  Needle gauge: 24 G Needle length: 9 cm Additional Notes Negative paresthesia. Negative blood return. Positive free-flowing CSF. Expiration date of kit checked and confirmed. Patient tolerated procedure well, without complications.

## 2017-04-22 NOTE — Transfer of Care (Signed)
Immediate Anesthesia Transfer of Care Note  Patient: JERONICA STLOUIS  Procedure(s) Performed: RIGHT TOTAL KNEE ARTHROPLASTY WITH COMPUTER NAVIGATION (Right Knee)  Patient Location: PACU  Anesthesia Type:Spinal  Level of Consciousness: awake, alert , oriented and patient cooperative  Airway & Oxygen Therapy: Patient Spontanous Breathing and Patient connected to nasal cannula oxygen  Post-op Assessment: Report given to RN and Post -op Vital signs reviewed and stable  Post vital signs: Reviewed and stable  Last Vitals:  Vitals:   04/22/17 1609 04/22/17 1610  BP:  (!) 152/140  Pulse: 74 74  Resp: 15 16  Temp:    SpO2: 94% 100%    Last Pain:  Vitals:   04/22/17 1300  TempSrc: Oral         Complications: No apparent anesthesia complications

## 2017-04-22 NOTE — Anesthesia Preprocedure Evaluation (Addendum)
Anesthesia Evaluation  Patient identified by MRN, date of birth, ID band Patient awake    Reviewed: Allergy & Precautions, NPO status , Patient's Chart, lab work & pertinent test results  Airway Mallampati: II  TM Distance: >3 FB Neck ROM: Full    Dental  (+) Teeth Intact, Dental Advisory Given   Pulmonary former smoker,    breath sounds clear to auscultation       Cardiovascular hypertension, Pt. on medications  Rhythm:Regular Rate:Normal     Neuro/Psych PSYCHIATRIC DISORDERS Anxiety Depression    GI/Hepatic Neg liver ROS, GERD  Medicated,  Endo/Other  negative endocrine ROS  Renal/GU negative Renal ROS     Musculoskeletal negative musculoskeletal ROS (+)   Abdominal Normal abdominal exam  (+)   Peds  Hematology negative hematology ROS (+)   Anesthesia Other Findings   Reproductive/Obstetrics                            Lab Results  Component Value Date   WBC 6.6 04/16/2017   HGB 13.7 04/16/2017   HCT 40.3 04/16/2017   MCV 95.0 04/16/2017   PLT 272 04/16/2017   Lab Results  Component Value Date   INR 0.95 10/15/2016   EKG: normal sinus rhythm.  Echo: - Left ventricle: The cavity size was normal. There was mild   concentric hypertrophy. Systolic function was vigorous. The   estimated ejection fraction was in the range of 65% to 70%. Wall   motion was normal; there were no regional wall motion   abnormalities. Doppler parameters are consistent with abnormal   left ventricular relaxation (grade 1 diastolic dysfunction).   Doppler parameters are consistent with high ventricular filling   pressure. - Aortic valve: Transvalvular velocity was within the normal range.   There was no stenosis. There was mild regurgitation. - Mitral valve: Transvalvular velocity was within the normal range.   There was no evidence for stenosis. There was trivial   regurgitation. - Right ventricle: The  cavity size was normal. Wall thickness was   normal. Systolic function was normal.  Anesthesia Physical Anesthesia Plan  ASA: II  Anesthesia Plan: Spinal   Post-op Pain Management:  Regional for Post-op pain   Induction: Intravenous  PONV Risk Score and Plan: 3 and Ondansetron, Propofol infusion, Dexamethasone and Midazolam  Airway Management Planned: Simple Face Mask  Additional Equipment: None  Intra-op Plan:   Post-operative Plan:   Informed Consent: I have reviewed the patients History and Physical, chart, labs and discussed the procedure including the risks, benefits and alternatives for the proposed anesthesia with the patient or authorized representative who has indicated his/her understanding and acceptance.   Dental advisory given  Plan Discussed with: CRNA  Anesthesia Plan Comments: (Will attempt spinal above fusion. Low threshold for GA conversion. TIVA if GA.)      Anesthesia Quick Evaluation

## 2017-04-22 NOTE — Op Note (Signed)
OPERATIVE REPORT  SURGEON: Rod Can, MD   ASSISTANT: Staff.  PREOPERATIVE DIAGNOSIS: Right knee arthritis.   POSTOPERATIVE DIAGNOSIS: Right knee arthritis.   PROCEDURE: Right total knee arthroplasty.   IMPLANTS: Stryker Triathlon CR femur, size 4. Stryker Tritanium tibia, size 3. X3 polyethelyene insert, size 11 mm, CR. 3 button asymmetric patella, size 29 mm.  ANESTHESIA:  Regional and Spinal  TOURNIQUET TIME: Not utilized.   ESTIMATED BLOOD LOSS:-150 mL    ANTIBIOTICS: 900 mg clindamycin.  DRAINS: None.  COMPLICATIONS: None   CONDITION: PACU - hemodynamically stable.   BRIEF CLINICAL NOTE: Dominique Griffith is a 77 y.o. female with a long-standing history of Right knee arthritis. After failing conservative management, the patient was indicated for total knee arthroplasty. The risks, benefits, and alternatives to the procedure were explained, and the patient elected to proceed.  PROCEDURE IN DETAIL: Adductor canal block was obtained in the pre-op holding area. Once inside the operative room, spinal anesthesia was obtained, and a foley catheter was inserted. The patient was then positioned, a nonsterile tourniquet was placed, and the lower extremity was prepped and draped in the normal sterile surgical fashion. A time-out was called verifying side and site of surgery. The patient received IV antibiotics within 60 minutes of beginning the procedure. The tourniquet was not utilized.  An anterior approach to the knee was performed utilizing a midvastus arthrotomy. A medial release was performed and the patellar fat pad was excised. Stryker navigation was used to cut the distal femur perpendicular to the mechanical axis. A freehand patellar resection was performed, and the patella was sized an prepared with 3 lug holes.  Nagivation was used to make a neutral proximal tibia  resection, taking 9 mm of bone from the less affected lateral side with 3 degrees of slope. The menisci were excised. A spacer block was placed, and the alignment and balance in extension were confirmed.   The distal femur was sized using the 3-degree external rotation guide referencing the posterior femoral cortex. The appropriate 4-in-1 cutting block was pinned into place. Rotation was checked using Whiteside's line, the epicondylar axis, and then confirmed with a spacer block in flexion. The remaining femoral cuts were performed, taking care to protect the MCL.  The tibia was sized and the trial tray was pinned into place. The remaining trail components were inserted. The knee was stable to varus and valgus stress through a full range of motion. The patella tracked centrally, and the PCL was well balanced. The trial components were removed, and the proximal tibial surface was prepared. Final components were impacted into place. The knee was tested for a final time and found to be well balanced.  The wound was copiously irrigated with normal saline with pulse lavage. Marcaine solution was injected into the periarticular soft tissue. The wound was closed in layers using #1 Vicryl and Stratafix for the fascia, 2-0 Vicryl for the subcutaneous fat, 2-0 Monocryl for the deep dermal layer, staples for the skin, and Dermabond for the skin. Once the glue was fully dried, an Aquacell Ag and compressive dressing were applied. Tthe patient was transported to the recovery room in stable condition. Sponge, needle, and instrument counts were correct at the end of the case x2. The patient tolerated the procedure well and there were no known complications.

## 2017-04-23 ENCOUNTER — Encounter (HOSPITAL_COMMUNITY): Payer: Self-pay | Admitting: Orthopedic Surgery

## 2017-04-23 LAB — BASIC METABOLIC PANEL
ANION GAP: 6 (ref 5–15)
BUN: 11 mg/dL (ref 6–20)
CALCIUM: 8.4 mg/dL — AB (ref 8.9–10.3)
CO2: 27 mmol/L (ref 22–32)
CREATININE: 0.71 mg/dL (ref 0.44–1.00)
Chloride: 101 mmol/L (ref 101–111)
GFR calc non Af Amer: >60 mL/min (ref >60)
Glucose, Bld: 134 mg/dL — ABNORMAL HIGH (ref 65–99)
Potassium: 4.8 mmol/L (ref 3.5–5.1)
SODIUM: 134 mmol/L — AB (ref 135–145)

## 2017-04-23 LAB — CBC
HCT: 32 % — ABNORMAL LOW (ref 36.0–46.0)
Hemoglobin: 10.9 g/dL — ABNORMAL LOW (ref 12.0–15.0)
MCH: 33.1 pg (ref 26.0–34.0)
MCHC: 34.1 g/dL (ref 30.0–36.0)
MCV: 97.3 fL (ref 78.0–100.0)
Platelets: 233 10*3/uL (ref 150–400)
RBC: 3.29 MIL/uL — ABNORMAL LOW (ref 3.87–5.11)
RDW: 13.6 % (ref 11.5–15.5)
WBC: 10.8 10*3/uL — AB (ref 4.0–10.5)

## 2017-04-23 MED ORDER — ONDANSETRON HCL 4 MG PO TABS: 4.0000 mg | ORAL_TABLET | Freq: Four times a day (QID) | ORAL | 0 refills | Status: DC | PRN

## 2017-04-23 MED ORDER — SODIUM CHLORIDE 0.9 % IV BOLUS (SEPSIS)
1000.0000 mL | Freq: Once | INTRAVENOUS | Status: AC
  Administered 2017-04-23: 1000 mL via INTRAVENOUS

## 2017-04-23 MED ORDER — DOCUSATE SODIUM 100 MG PO CAPS: 100.0000 mg | ORAL_CAPSULE | Freq: Two times a day (BID) | ORAL | 1 refills | Status: DC

## 2017-04-23 MED ORDER — HYDROCODONE-ACETAMINOPHEN 5-325 MG PO TABS: 1.0000 | ORAL_TABLET | ORAL | 0 refills | Status: DC | PRN

## 2017-04-23 MED ORDER — SENNA 8.6 MG PO TABS: 2.0000 | ORAL_TABLET | Freq: Every day | ORAL | 3 refills | Status: DC

## 2017-04-23 MED ORDER — ASPIRIN 81 MG PO CHEW: 81.0000 mg | CHEWABLE_TABLET | Freq: Two times a day (BID) | ORAL | 1 refills | Status: DC

## 2017-04-23 NOTE — Discharge Summary (Signed)
Physician Discharge Summary  Patient ID: Dominique Griffith MRN: [id number] DOB/AGE: January 30, 1941 77 y.o.  Admit date: 04/22/2017 Discharge date: 04/24/2017  Admission Diagnoses:  Osteoarthritis of right knee  Discharge Diagnoses:  Principal Problem:   Osteoarthritis of right knee   Past Medical History:  Diagnosis Date  . Anxiety   . Complication of anesthesia    BACK SURGERY IN 2010; WAS PUT UNDER GENERAL ANESTHESIA ; REPORTS MEMORY LOSS AND CHANGE IN BEHAVIOR FOR 2 MONTHS POST-OP ; STILL OCCASIONALLY HAS TIMES SHE FORGETS THINGS   . Depression   . GERD (gastroesophageal reflux disease)   . Hypertension   . Syncope and collapse    6 MONTHS AGO HAD SYNCOPE AND COLLAPSE ; SAW CARDIOLOGIST;  HAD UNREMARKABLE ECHO ;     Surgeries: Procedure(s): RIGHT TOTAL KNEE ARTHROPLASTY WITH COMPUTER NAVIGATION on 04/22/2017   Consultants (if any):   Discharged Condition: Improved  Hospital Course: Dominique Griffith is an 77 y.o. female who was admitted 04/22/2017 with a diagnosis of Osteoarthritis of right knee and went to the operating room on 04/22/2017 and underwent the above named procedures.    She was given perioperative antibiotics:  Anti-infectives (From admission, onward)   Start     Dose/Rate Route Frequency Ordered Stop   04/23/17 0000  clindamycin (CLEOCIN) IVPB 600 mg     600 mg 100 mL/hr over 30 Minutes Intravenous Every 6 hours 04/22/17 2112 04/23/17 0555   04/22/17 1649  clindamycin (CLEOCIN) 900 MG/50ML IVPB    Comments:  [place]  : cabinet override      04/22/17 1649 04/22/17 1804   04/22/17 1330  clindamycin (CLEOCIN) IVPB 900 mg     900 mg 100 mL/hr over 30 Minutes Intravenous On call to O.R. 04/22/17 1317 04/22/17 1804    .  She was given sequential compression devices, early ambulation, and ASA for DVT prophylaxis.  She benefited maximally from the hospital stay and there were no complications.    Recent vital signs:  Vitals:   04/24/17 0507 04/24/17  1010  BP: (!) 127/59 (!) 108/50  Pulse: 82 85  Resp: 17   Temp: 97.6 F (36.4 C)   SpO2: 99%     Recent laboratory studies:  Lab Results  Component Value Date   HGB 9.2 (L) 04/24/2017   HGB 10.9 (L) 04/23/2017   HGB 13.7 04/16/2017   Lab Results  Component Value Date   WBC 10.1 04/24/2017   PLT 198 04/24/2017   Lab Results  Component Value Date   INR 0.95 10/15/2016   Lab Results  Component Value Date   NA 134 (L) 04/23/2017   K 4.8 04/23/2017   CL 101 04/23/2017   CO2 27 04/23/2017   BUN 11 04/23/2017   CREATININE 0.71 04/23/2017   GLUCOSE 134 (H) 04/23/2017    Discharge Medications:   Allergies as of 04/24/2017      Reactions   Amoxicillin Rash   Penicillins Rash, Other (See Comments)   Has patient had a PCN reaction causing immediate rash, facial/tongue/throat swelling, SOB or lightheadedness with hypotension: No Has patient had a PCN reaction causing severe rash involving mucus membranes or skin necrosis: No Has patient had a PCN reaction that required hospitalization: No Has patient had a PCN reaction occurring within the last 10 years: No If all of the above answers are "NO", then may proceed with Cephalosporin use.      Medication List    STOP taking these medications   diclofenac  sodium 1 % Gel Commonly known as:  VOLTAREN     TAKE these medications   aluminum chloride 20 % external solution Commonly known as:  DRYSOL Apply 1 application topically once a week.   amitriptyline 25 MG tablet Commonly known as:  ELAVIL Take 25 mg by mouth at bedtime.   aspirin 81 MG chewable tablet Chew 1 tablet (81 mg total) by mouth 2 (two) times daily.   baclofen 10 MG tablet Commonly known as:  LIORESAL Take 10 mg by mouth 3 (three) times daily.   buPROPion 150 MG 24 hr tablet Commonly known as:  WELLBUTRIN XL Take 150 mg by mouth daily.   CALCIUM-VITAMIN D3 PO Take 1 tablet by mouth every other day.   cetirizine 10 MG tablet Commonly known as:   ZYRTEC Take 10 mg by mouth daily.   colchicine 0.6 MG tablet Take 0.6 mg by mouth daily.   docusate sodium 100 MG capsule Commonly known as:  COLACE Take 1 capsule (100 mg total) by mouth 2 (two) times daily.   escitalopram 20 MG tablet Commonly known as:  LEXAPRO Take 20 mg by mouth daily.   famotidine 40 MG tablet Commonly known as:  PEPCID Take 40 mg by mouth 2 (two) times daily.   FIBER PO Take 1 capsule by mouth daily.   glucosamine-chondroitin 500-400 MG tablet Take 1 tablet by mouth daily.   HYDROcodone-acetaminophen 5-325 MG tablet Commonly known as:  NORCO/VICODIN Take 1-2 tablets by mouth every 4 (four) hours as needed (prn knee pain).   losartan 100 MG tablet Commonly known as:  COZAAR Take 100 mg by mouth daily.   meloxicam 15 MG tablet Commonly known as:  MOBIC Take 15 mg by mouth daily   MULTIVITAMIN PO Take 1 tablet by mouth daily.   NITRO-BID 2 % ointment Generic drug:  nitroGLYCERIN Apply 0.5 inches topically daily as needed (SKIN OF FOOT).   ondansetron 4 MG tablet Commonly known as:  ZOFRAN Take 1 tablet (4 mg total) by mouth every 6 (six) hours as needed for nausea.   PROAIR HFA 108 (90 Base) MCG/ACT inhaler Generic drug:  albuterol Inhale 2 puffs into the lungs every 6 (six) hours as needed for shortness of breath or wheezing.   senna 8.6 MG Tabs tablet Commonly known as:  SENOKOT Take 2 tablets (17.2 mg total) by mouth at bedtime.       Diagnostic Studies: Dg Knee Right Port  Result Date: 04/22/2017 CLINICAL DATA:  Postop EXAM: PORTABLE RIGHT KNEE - 1-2 VIEW COMPARISON:  None. FINDINGS: Changes of right knee replacement. No hardware bony complicating feature. Soft tissue and joint space gas noted. IMPRESSION: Right knee replacement.  No complicating feature. Electronically Signed   By: Rolm Baptise M.D.   On: 04/22/2017 20:35    Disposition: 01-Home or Self Care  Discharge Instructions    Call MD / Call 911   Complete by:  As  directed    If you experience chest pain or shortness of breath, CALL 911 and be transported to the hospital emergency room.  If you develope a fever above 101 F, pus (white drainage) or increased drainage or redness at the wound, or calf pain, call your surgeon's office.   Constipation Prevention   Complete by:  As directed    Drink plenty of fluids.  Prune juice may be helpful.  You may use a stool softener, such as Colace (over the counter) 100 mg twice a day.  Use MiraLax (over  the counter) for constipation as needed.   Diet - low sodium heart healthy   Complete by:  As directed    Increase activity slowly as tolerated   Complete by:  As directed       Follow-up Information    Daysie Helf, Aaron Edelman, MD. Schedule an appointment as soon as possible for a visit in 2 weeks.   Specialty:  Orthopedic Surgery Why:  For wound re-check, For suture removal Contact information: [address] [place] [address] [phone]            Signed: Hilton Cork Ashaki Frosch 04/25/2017, 11:24 PM

## 2017-04-23 NOTE — Progress Notes (Signed)
Spoke with patient at bedside. Confirmed plan for OP PT, already arranged. Has RW and 3n1. [phone] 

## 2017-04-23 NOTE — Evaluation (Signed)
Physical Therapy Evaluation Patient Details Name: Dominique Griffith MRN: [id number] DOB: January 23, 1941 Today's Date: 04/23/2017   History of Present Illness  s/p R TKA  Clinical Impression  Pt s/p R TKR and presents with decreased R LE strength/ROM and post op pain limiting functional mobility.  Pt should progress to dc home with family assist.  Pt states she has OP Pt scheduled for Monday 04/26/17.    Follow Up Recommendations Follow surgeon's recommendation for DC plan and follow-up therapies    Equipment Recommendations  Rolling walker with 5" wheels;3in1 (PT)    Recommendations for Other Services OT consult     Precautions / Restrictions Precautions Precautions: Knee;Fall Restrictions Weight Bearing Restrictions: No Other Position/Activity Restrictions: WBAT      Mobility  Bed Mobility Overal bed mobility: Needs Assistance Bed Mobility: Supine to Sit     Supine to sit: Min assist     General bed mobility comments: cues for sequence and use of L LE to self assist  Transfers Overall transfer level: Needs assistance Equipment used: Rolling walker (2 wheeled) Transfers: Sit to/from Stand Sit to Stand: Min assist         General transfer comment: cues for UE/LE placement  Ambulation/Gait Ambulation/Gait assistance: Min assist Ambulation Distance (Feet): 64 Feet Assistive device: Rolling walker (2 wheeled) Gait Pattern/deviations: Step-to pattern;Decreased step length - right;Decreased step length - left;Shuffle;Trunk flexed Gait velocity: decr Gait velocity interpretation: Below normal speed for age/gender General Gait Details: cues for sequence, posture and position from [employer]            Wheelchair Mobility    Modified Rankin (Stroke Patients Only)       Balance                                             Pertinent Vitals/Pain Pain Assessment: 0-10 Pain Score: 3  Pain Location: R knee Pain Descriptors / Indicators:  Aching Pain Intervention(s): Limited activity within patient's tolerance;Monitored during session;Premedicated before session;Ice applied    Home Living Family/patient expects to be discharged to:: Private residence Living Arrangements: Spouse/significant other Available Help at Discharge: Family Type of Home: House Home Access: Stairs to enter Entrance Stairs-Rails: Left Entrance Stairs-Number of Steps: 4 Home Layout: One level Home Equipment: None Additional Comments: has a teak seat    Prior Function Level of Independence: Independent               Hand Dominance        Extremity/Trunk Assessment   Upper Extremity Assessment Upper Extremity Assessment: Overall WFL for tasks assessed    Lower Extremity Assessment Lower Extremity Assessment: RLE deficits/detail RLE Deficits / Details: 3/5 quads with IND SLR and AAROM at knee -10 - 90       Communication   Communication: No difficulties  Cognition Arousal/Alertness: Awake/alert Behavior During Therapy: WFL for tasks assessed/performed Overall Cognitive Status: No family/caregiver present to determine baseline cognitive functioning                                 General Comments: ? memory, may be due to pain medication      General Comments      Exercises Total Joint Exercises Ankle Circles/Pumps: AROM;Both;15 reps;Supine Quad Sets: AROM;Both;10 reps;Supine Heel Slides: AAROM;Right;15 reps;Supine Straight Leg Raises: AAROM;AROM;Right;15 reps;Supine  Assessment/Plan    PT Assessment Patient needs continued PT services  PT Problem List Decreased strength;Decreased range of motion;Decreased activity tolerance;Decreased balance;Decreased mobility;Decreased knowledge of use of DME;Pain       PT Treatment Interventions DME instruction;Gait training;Stair training;Functional mobility training;Therapeutic activities;Therapeutic exercise;Patient/family education    PT Goals (Current goals can  be found in the Care Plan section)  Acute Rehab PT Goals Patient Stated Goal: home PT Goal Formulation: With patient Time For Goal Achievement: 04/30/17 Potential to Achieve Goals: Good    Frequency 7X/week   Barriers to discharge        Co-evaluation               AM-PAC PT "6 Clicks" Daily Activity  Outcome Measure Difficulty turning over in bed (including adjusting bedclothes, sheets and blankets)?: Unable Difficulty moving from lying on back to sitting on the side of the bed? : Unable Difficulty sitting down on and standing up from a chair with arms (e.g., wheelchair, bedside commode, etc,.)?: Unable Help needed moving to and from a bed to chair (including a wheelchair)?: A Little Help needed walking in hospital room?: A Little Help needed climbing 3-5 steps with a railing? : A Little 6 Click Score: 12    End of Session Equipment Utilized During Treatment: Gait belt Activity Tolerance: Patient tolerated treatment well Patient left: in chair;with call bell/phone within reach Nurse Communication: Mobility status PT Visit Diagnosis: Difficulty in walking, not elsewhere classified (R26.2)    Time: 0820-0902 PT Time Calculation (min) (ACUTE ONLY): 42 min   Charges:   PT Evaluation $PT Eval Low Complexity: 1 Low PT Treatments $Gait Training: 8-22 mins $Therapeutic Exercise: 8-22 mins   PT G Codes:        Pg [phone]   Dominique Griffith 04/23/2017, 11:57 AM

## 2017-04-23 NOTE — Evaluation (Signed)
Occupational Therapy Evaluation Patient Details Name: Dominique Griffith MRN: [id number] DOB: Feb 22, 1941 Today's Date: 04/23/2017    History of Present Illness s/p R TKA   Clinical Impression   This 77 year old female was admitted for the above sx.  She will benefit from continued OT to reinforce safe bathroom transfers as well as safety/precautions    Follow Up Recommendations  Supervision/Assistance - 24 hour    Equipment Recommendations  3 in 1 bedside commode(husband is trying to get this)    Recommendations for Other Services       Precautions / Restrictions Precautions Precautions: Knee;Fall Restrictions Weight Bearing Restrictions: No      Mobility Bed Mobility               General bed mobility comments: oob  Transfers Overall transfer level: Needs assistance Equipment used: Rolling walker (2 wheeled) Transfers: Sit to/from Stand Sit to Stand: Min guard         General transfer comment: cues for UE/LE placement    Balance                                           ADL either performed or assessed with clinical judgement   ADL Overall ADL's : Needs assistance/impaired             Lower Body Bathing: Minimal assistance;Sit to/from stand       Lower Body Dressing: Moderate assistance;Sit to/from stand   Toilet Transfer: Min guard;Ambulation;BSC;RW             General ADL Comments: ambulated to bathroom and practiced toilet transfer. Pt has catheter in place. Demonstrated shower transfer:  will return to practice this prior to discharge.  Pt can perform UB adls with set up. Her husband can assist with LB adls.  Educated on AE.  Reviewed precautions and general safety     Vision         Perception     Praxis      Pertinent Vitals/Pain Pain Assessment: 0-10 Pain Score: 3  Pain Location: R knee Pain Descriptors / Indicators: Aching Pain Intervention(s): Limited activity within patient's tolerance;Monitored  during session;Premedicated before session;Repositioned     Hand Dominance     Extremity/Trunk Assessment Upper Extremity Assessment Upper Extremity Assessment: Overall WFL for tasks assessed           Communication Communication Communication: No difficulties   Cognition Arousal/Alertness: Awake/alert Behavior During Therapy: WFL for tasks assessed/performed Overall Cognitive Status: No family/caregiver present to determine baseline cognitive functioning                                 General Comments: ? memory, may be due to pain medication   General Comments       Exercises     Shoulder Instructions      Home Living Family/patient expects to be discharged to:: Private residence Living Arrangements: Spouse/significant other                 Bathroom Shower/Tub: Occupational psychologist: Standard         Additional Comments: has a Pensions consultant      Prior Functioning/Environment Level of Independence: Independent                 OT Problem  List: Decreased strength;Decreased knowledge of use of DME or AE;Pain(? memory)      OT Treatment/Interventions: Self-care/ADL training;DME and/or AE instruction;Patient/family education    OT Goals(Current goals can be found in the care plan section) Acute Rehab OT Goals Patient Stated Goal: home OT Goal Formulation: With patient Time For Goal Achievement: 05/07/17 Potential to Achieve Goals: Good ADL Goals Pt Will Perform Grooming: with supervision;standing Pt Will Transfer to Toilet: with supervision;bedside commode;ambulating Pt Will Perform Tub/Shower Transfer: Shower transfer;ambulating;3 in 1;with min guard assist  OT Frequency: Min 2X/week   Barriers to D/C:            Co-evaluation              AM-PAC PT "6 Clicks" Daily Activity     Outcome Measure Help from another person eating meals?: None Help from another person taking care of personal grooming?: A  Little Help from another person toileting, which includes using toliet, bedpan, or urinal?: A Little Help from another person bathing (including washing, rinsing, drying)?: A Little Help from another person to put on and taking off regular upper body clothing?: A Little Help from another person to put on and taking off regular lower body clothing?: A Lot 6 Click Score: 18   End of Session    Activity Tolerance: Patient tolerated treatment well Patient left: in chair;with call bell/phone within reach  OT Visit Diagnosis: Pain Pain - Right/Left: Right Pain - part of body: Knee                Time: 1040-1102 OT Time Calculation (min): 22 min Charges:  OT General Charges $OT Visit: 1 Visit OT Evaluation $OT Eval Low Complexity: 1 Low G-Codes:     [place], OTR/L [phone] 04/23/2017  Dominique Griffith 04/23/2017, 11:43 AM

## 2017-04-23 NOTE — Progress Notes (Signed)
    Subjective:  Patient reports pain as mild to moderate.  Denies N/V/CP/SOB. No c/o.  Objective:   VITALS:   Vitals:   04/22/17 2304 04/23/17 0004 04/23/17 0209 04/23/17 0523  BP: (!) 120/58 118/75 (!) 107/53 103/63  Pulse: 72 71 71 72  Resp: 17 16 17 18   Temp: 97.8 F (36.6 C) 97.7 F (36.5 C) 97.6 F (36.4 C) 98 F (36.7 C)  TempSrc: Oral Oral Oral   SpO2: 97% 98% 99% 98%  Weight:      Height:        NAD ABD soft Sensation intact distally Intact pulses distally Dorsiflexion/Plantar flexion intact Incision: dressing C/D/I Compartment soft   Lab Results  Component Value Date   WBC 10.8 (H) 04/23/2017   HGB 10.9 (L) 04/23/2017   HCT 32.0 (L) 04/23/2017   MCV 97.3 04/23/2017   PLT 233 04/23/2017   BMET    Component Value Date/Time   NA 134 (L) 04/23/2017 0515   K 4.8 04/23/2017 0515   CL 101 04/23/2017 0515   CO2 27 04/23/2017 0515   GLUCOSE 134 (H) 04/23/2017 0515   BUN 11 04/23/2017 0515   CREATININE 0.71 04/23/2017 0515   CALCIUM 8.4 (L) 04/23/2017 0515   GFRNONAA >60 04/23/2017 0515   GFRAA >60 04/23/2017 0515     Assessment/Plan: 1 Day Post-Op   Principal Problem:   Osteoarthritis of right knee   WBAT with walker DVT ppx: ASA, SCDs, TEDS PO pain control PT/OT Dispo: D/C home tomorrow, already set up for outpatient PT    Dominique Griffith 04/23/2017, 7:49 AM   Rod Can, MD Cell [phone]

## 2017-04-23 NOTE — Progress Notes (Signed)
Physical Therapy Treatment Patient Details Name: Dominique Griffith MRN: [id number] DOB: [birth year] Today's Date: 04/23/2017    History of Present Illness s/p R TKA    PT Comments    Therex performed, spouse present.   Follow Up Recommendations  Follow surgeon's recommendation for DC plan and follow-up therapies     Equipment Recommendations  Rolling walker with 5" wheels;3in1 (PT)    Recommendations for Other Services OT consult     Precautions / Restrictions Precautions Precautions: Knee;Fall Restrictions Weight Bearing Restrictions: No Other Position/Activity Restrictions: WBAT    Mobility  Bed Mobility Overal bed mobility: Needs Assistance Bed Mobility: Sit to Supine       Sit to supine: Min guard   General bed mobility comments: cues for sequence and use of L LE to self assist  Transfers Overall transfer level: Needs assistance Equipment used: Rolling walker (2 wheeled) Transfers: Sit to/from Stand Sit to Stand: Min guard         General transfer comment: cues for UE/LE placement  Ambulation/Gait Ambulation/Gait assistance: Min assist;Min guard Ambulation Distance (Feet): 180 Feet Assistive device: Rolling walker (2 wheeled) Gait Pattern/deviations: Step-to pattern;Decreased step length - right;Decreased step length - left;Shuffle;Trunk flexed Gait velocity: decr Gait velocity interpretation: Below normal speed for age/gender General Gait Details: cues for sequence, posture and position from [name]            Wheelchair Mobility    Modified Rankin (Stroke Patients Only)       Balance                                            Cognition Arousal/Alertness: Awake/alert Behavior During Therapy: WFL for tasks assessed/performed;Impulsive Overall Cognitive Status: Within Functional Limits for tasks assessed                                 General Comments: Pt appears more clear headed this pm       Exercises Total Joint Exercises Ankle Circles/Pumps: AROM;Both;15 reps;Supine Quad Sets: AROM;Both;10 reps;Supine Heel Slides: AAROM;Right;15 reps;Supine Straight Leg Raises: AAROM;AROM;Right;15 reps;Supine    General Comments        Pertinent Vitals/Pain Pain Assessment: 0-10 Pain Score: 4  Pain Location: R knee Pain Descriptors / Indicators: Aching Pain Intervention(s): Limited activity within patient's tolerance;Monitored during session;Premedicated before session;Ice applied    Home Living                      Prior Function            PT Goals (current goals can now be found in the care plan section) Acute Rehab PT Goals Patient Stated Goal: home PT Goal Formulation: With patient Time For Goal Achievement: 04/30/17 Potential to Achieve Goals: Good Progress towards PT goals: Progressing toward goals    Frequency    7X/week      PT Plan Current plan remains appropriate    Co-evaluation              AM-PAC PT "6 Clicks" Daily Activity  Outcome Measure  Difficulty turning over in bed (including adjusting bedclothes, sheets and blankets)?: Unable Difficulty moving from lying on back to sitting on the side of the bed? : Unable Difficulty sitting down on and standing up from a chair with arms (  e.g., wheelchair, bedside commode, etc,.)?: Unable Help needed moving to and from a bed to chair (including a wheelchair)?: A Little Help needed walking in hospital room?: A Little Help needed climbing 3-5 steps with a railing? : A Little 6 Click Score: 12    End of Session Equipment Utilized During Treatment: Gait belt Activity Tolerance: Patient tolerated treatment well Patient left: in bed;with call bell/phone within reach;with family/visitor present Nurse Communication: Mobility status PT Visit Diagnosis: Difficulty in walking, not elsewhere classified (R26.2)     Time: 7035-0093 PT Time Calculation (min) (ACUTE ONLY): 20 min  Charges:  $Gait  Training: 8-22 mins $Therapeutic Exercise: 8-22 mins                    G Codes:       Pg [phone]    Aloysious Vangieson 04/23/2017, 4:36 PM

## 2017-04-23 NOTE — Progress Notes (Signed)
Made Dr.Swinteck aware of patients blood pressure 99/40. 1L bolus ordered.

## 2017-04-23 NOTE — Progress Notes (Signed)
Physical Therapy Treatment Patient Details Name: Dominique Griffith MRN: [id number] DOB: Oct 20, 1940 Today's Date: 04/23/2017    History of Present Illness s/p R TKA    PT Comments    Pt progressing well with mobility and with pain well controlled.  Pt hopeful for dc home tomorrow.   Follow Up Recommendations  Follow surgeon's recommendation for DC plan and follow-up therapies     Equipment Recommendations  Rolling walker with 5" wheels;3in1 (PT)    Recommendations for Other Services OT consult     Precautions / Restrictions Precautions Precautions: Knee;Fall Restrictions Weight Bearing Restrictions: No Other Position/Activity Restrictions: WBAT    Mobility  Bed Mobility Overal bed mobility: Needs Assistance Bed Mobility: Sit to Supine       Sit to supine: Min guard   General bed mobility comments: cues for sequence and use of L LE to self assist  Transfers Overall transfer level: Needs assistance Equipment used: Rolling walker (2 wheeled) Transfers: Sit to/from Stand Sit to Stand: Min guard         General transfer comment: cues for UE/LE placement  Ambulation/Gait Ambulation/Gait assistance: Min assist;Min guard Ambulation Distance (Feet): 180 Feet Assistive device: Rolling walker (2 wheeled) Gait Pattern/deviations: Step-to pattern;Decreased step length - right;Decreased step length - left;Shuffle;Trunk flexed Gait velocity: decr Gait velocity interpretation: Below normal speed for age/gender General Gait Details: cues for sequence, posture and position from [name]            Wheelchair Mobility    Modified Rankin (Stroke Patients Only)       Balance                                            Cognition Arousal/Alertness: Awake/alert Behavior During Therapy: WFL for tasks assessed/performed;Impulsive Overall Cognitive Status: Within Functional Limits for tasks assessed                                 General Comments: Pt appears more clear headed this pm      Exercises      General Comments        Pertinent Vitals/Pain Pain Assessment: 0-10 Pain Score: 3  Pain Location: R knee Pain Descriptors / Indicators: Aching Pain Intervention(s): Limited activity within patient's tolerance;Monitored during session;Premedicated before session    Home Living                      Prior Function            PT Goals (current goals can now be found in the care plan section) Acute Rehab PT Goals Patient Stated Goal: home PT Goal Formulation: With patient Time For Goal Achievement: 04/30/17 Potential to Achieve Goals: Good Progress towards PT goals: Progressing toward goals    Frequency    7X/week      PT Plan Current plan remains appropriate    Co-evaluation              AM-PAC PT "6 Clicks" Daily Activity  Outcome Measure  Difficulty turning over in bed (including adjusting bedclothes, sheets and blankets)?: Unable Difficulty moving from lying on back to sitting on the side of the bed? : Unable Difficulty sitting down on and standing up from a chair with arms (e.g., wheelchair, bedside commode, etc,.)?: Unable Help  needed moving to and from a bed to chair (including a wheelchair)?: A Little Help needed walking in hospital room?: A Little Help needed climbing 3-5 steps with a railing? : A Little 6 Click Score: 12    End of Session Equipment Utilized During Treatment: Gait belt Activity Tolerance: Patient tolerated treatment well Patient left: in bed;with call bell/phone within reach;with family/visitor present Nurse Communication: Mobility status PT Visit Diagnosis: Difficulty in walking, not elsewhere classified (R26.2)     Time: 1420-1440 PT Time Calculation (min) (ACUTE ONLY): 20 min  Charges:  $Gait Training: 8-22 mins                    G Codes:       Pg [phone]    Katelind Pytel 04/23/2017, 4:31 PM

## 2017-04-24 LAB — CBC
HCT: 27.1 % — ABNORMAL LOW (ref 36.0–46.0)
Hemoglobin: 9.2 g/dL — ABNORMAL LOW (ref 12.0–15.0)
MCH: 33.3 pg (ref 26.0–34.0)
MCHC: 33.9 g/dL (ref 30.0–36.0)
MCV: 98.2 fL (ref 78.0–100.0)
PLATELETS: 198 10*3/uL (ref 150–400)
RBC: 2.76 MIL/uL — ABNORMAL LOW (ref 3.87–5.11)
RDW: 14.1 % (ref 11.5–15.5)
WBC: 10.1 10*3/uL (ref 4.0–10.5)

## 2017-04-24 NOTE — Progress Notes (Signed)
Contacted [facility] for RW and 3n1 bedside commode to room prior to dc. Jonnie Finner RN CCM Case Mgmt phone [phone]

## 2017-04-24 NOTE — Progress Notes (Signed)
Physical Therapy Treatment Patient Details Name: Dominique Griffith MRN: [id number] DOB: 05-14-1940 Today's Date: 04/24/2017    History of Present Illness s/p R TKA    PT Comments    Pt motivated and progressing well with mobility.  Pt up in hall ambulating and performed therex program with assist.   Follow Up Recommendations  Follow surgeon's recommendation for DC plan and follow-up therapies     Equipment Recommendations  Rolling walker with 5" wheels;3in1 (PT)    Recommendations for Other Services OT consult     Precautions / Restrictions Precautions Precautions: Knee;Fall Restrictions Weight Bearing Restrictions: No Other Position/Activity Restrictions: WBAT    Mobility  Bed Mobility               General bed mobility comments: Pt up in chair and requests back to same  Transfers Overall transfer level: Needs assistance Equipment used: Rolling walker (2 wheeled) Transfers: Sit to/from Stand Sit to Stand: Min guard;Supervision         General transfer comment: cues for UE/LE placement  Ambulation/Gait Ambulation/Gait assistance: Min guard Ambulation Distance (Feet): 200 Feet Assistive device: Rolling walker (2 wheeled) Gait Pattern/deviations: Step-to pattern;Step-through pattern;Decreased step length - right;Decreased step length - left;Shuffle;Trunk flexed Gait velocity: decr Gait velocity interpretation: Below normal speed for age/gender General Gait Details: cues for sequence, posture and position from RW   Stairs Stairs: (Deferred to arrival of spouse)          Wheelchair Mobility    Modified Rankin (Stroke Patients Only)       Balance Overall balance assessment: No apparent balance deficits (not formally assessed)                                          Cognition Arousal/Alertness: Awake/alert Behavior During Therapy: WFL for tasks assessed/performed;Impulsive Overall Cognitive Status: Within Functional Limits  for tasks assessed                                 General Comments: Pt with apparent memory issues and intermittent difficulty focussing      Exercises Total Joint Exercises Ankle Circles/Pumps: AROM;Both;15 reps;Supine Quad Sets: AROM;Both;10 reps;Supine Heel Slides: AAROM;Right;Supine;20 reps Straight Leg Raises: AAROM;AROM;Right;Supine;20 reps Long Arc Quad: AROM;Right;10 reps;Seated    General Comments        Pertinent Vitals/Pain Pain Assessment: 0-10 Pain Score: 4  Pain Location: R knee Pain Descriptors / Indicators: Aching;Sore Pain Intervention(s): Limited activity within patient's tolerance;Monitored during session;Premedicated before session;Ice applied    Home Living                      Prior Function            PT Goals (current goals can now be found in the care plan section) Acute Rehab PT Goals Patient Stated Goal: home PT Goal Formulation: With patient Time For Goal Achievement: 04/30/17 Potential to Achieve Goals: Good Progress towards PT goals: Progressing toward goals    Frequency    7X/week      PT Plan Current plan remains appropriate    Co-evaluation              AM-PAC PT "6 Clicks" Daily Activity  Outcome Measure  Difficulty turning over in bed (including adjusting bedclothes, sheets and blankets)?: A Lot Difficulty moving from lying  on back to sitting on the side of the bed? : A Lot Difficulty sitting down on and standing up from a chair with arms (e.g., wheelchair, bedside commode, etc,.)?: A Lot Help needed moving to and from a bed to chair (including a wheelchair)?: A Little Help needed walking in hospital room?: A Little Help needed climbing 3-5 steps with a railing? : A Little 6 Click Score: 15    End of Session Equipment Utilized During Treatment: Gait belt Activity Tolerance: Patient tolerated treatment well Patient left: with call bell/phone within reach;with family/visitor present;in  chair Nurse Communication: Mobility status PT Visit Diagnosis: Difficulty in walking, not elsewhere classified (R26.2)     Time: 2774-1287 PT Time Calculation (min) (ACUTE ONLY): 33 min  Charges:  $Gait Training: 8-22 mins $Therapeutic Exercise: 8-22 mins                    G Codes:       OM [phone]    Nation Cradle 04/24/2017, 11:44 AM

## 2017-04-24 NOTE — Progress Notes (Signed)
Subjective: 2 Days Post-Op Procedure(s) (LRB): RIGHT TOTAL KNEE ARTHROPLASTY WITH COMPUTER NAVIGATION (Right) Patient reports pain as 2 on 0-10 scale.    Objective: Vital signs in last 24 hours: Temp:  [97.6 F (36.4 C)-98.2 F (36.8 C)] 97.6 F (36.4 C) (03/02 0507) Pulse Rate:  [74-82] 82 (03/02 0507) Resp:  [15-17] 17 (03/02 0507) BP: (96-127)/(40-59) 127/59 (03/02 0507) SpO2:  [97 %-99 %] 99 % (03/02 0507)  Intake/Output from previous day: 03/01 0701 - 03/02 0700 In: 2640 [P.O.:840; I.V.:1800] Out: 2200 [Urine:2200] Intake/Output this shift: No intake/output data recorded.  Recent Labs    04/23/17 0515 04/24/17 0526  HGB 10.9* 9.2*   Recent Labs    04/23/17 0515 04/24/17 0526  WBC 10.8* 10.1  RBC 3.29* 2.76*  HCT 32.0* 27.1*  PLT 233 198   Recent Labs    04/23/17 0515  NA 134*  K 4.8  CL 101  CO2 27  BUN 11  CREATININE 0.71  GLUCOSE 134*  CALCIUM 8.4*   No results for input(s): LABPT, INR in the last 72 hours.  Neurologically intact Intact pulses distally Incision: dressing C/D/I No DVT Assessment/Plan: 2 Days Post-Op Procedure(s) (LRB): RIGHT TOTAL KNEE ARTHROPLASTY WITH COMPUTER NAVIGATION (Right) Advance diet Up with therapy Discharge home with home health  Cortlynn Hollinsworth C 04/24/2017, 7:52 AM

## 2017-04-24 NOTE — Progress Notes (Signed)
Discharged from floor via w/c for transport home by car. Belongings & spouse with pt. No changes in assessment. Dominique Griffith  

## 2017-04-24 NOTE — Progress Notes (Signed)
Physical Therapy Treatment Patient Details Name: Dominique Griffith MRN: [id number] DOB: Jul 25, 1940 Today's Date: 04/24/2017    History of Present Illness s/p R TKA    PT Comments    Pt progressing well with mobility.  Spouse present to review home therex with written instruction provided, as well as stair training.   Follow Up Recommendations  Follow surgeon's recommendation for DC plan and follow-up therapies     Equipment Recommendations  Rolling walker with 5" wheels;3in1 (PT)    Recommendations for Other Services OT consult     Precautions / Restrictions Precautions Precautions: Knee;Fall Restrictions Weight Bearing Restrictions: No Other Position/Activity Restrictions: WBAT    Mobility  Bed Mobility               General bed mobility comments: Pt up in chair and requests back to same  Transfers Overall transfer level: Needs assistance Equipment used: Rolling walker (2 wheeled) Transfers: Sit to/from Stand Sit to Stand: Min guard;Supervision         General transfer comment: cues for UE/LE placement  Ambulation/Gait Ambulation/Gait assistance: Min guard;Supervision Ambulation Distance (Feet): 60 Feet Assistive device: Rolling walker (2 wheeled) Gait Pattern/deviations: Step-to pattern;Step-through pattern;Decreased step length - right;Decreased step length - left;Shuffle;Trunk flexed Gait velocity: decr Gait velocity interpretation: Below normal speed for age/gender General Gait Details: cues for sequence, posture and position from RW   Stairs Stairs: Yes   Stair Management: One rail Left;Forwards;With crutches;Step to pattern Number of Stairs: 10 General stair comments: cues for sequence and foot/crutch placement.  Spouse assisting and written instruction provided  Wheelchair Mobility    Modified Rankin (Stroke Patients Only)       Balance Overall balance assessment: No apparent balance deficits (not formally assessed)                                           Cognition Arousal/Alertness: Awake/alert Behavior During Therapy: WFL for tasks assessed/performed;Impulsive Overall Cognitive Status: Within Functional Limits for tasks assessed                                 General Comments: Pt with apparent memory issues and intermittent difficulty focussing      Exercises Total Joint Exercises Ankle Circles/Pumps: AROM;Both;15 reps;Supine Quad Sets: AROM;Both;10 reps;Supine Heel Slides: AAROM;Right;Supine;20 reps Straight Leg Raises: AAROM;AROM;Right;Supine;20 reps Long Arc Quad: AROM;Right;10 reps;Seated    General Comments        Pertinent Vitals/Pain Pain Assessment: 0-10 Pain Score: 4  Pain Location: R knee Pain Descriptors / Indicators: Aching;Sore Pain Intervention(s): Limited activity within patient's tolerance;Monitored during session;Premedicated before session;Ice applied    Home Living                      Prior Function            PT Goals (current goals can now be found in the care plan section) Acute Rehab PT Goals Patient Stated Goal: home PT Goal Formulation: With patient Time For Goal Achievement: 04/30/17 Potential to Achieve Goals: Good Progress towards PT goals: Progressing toward goals    Frequency    7X/week      PT Plan Current plan remains appropriate    Co-evaluation              AM-PAC PT "6 Clicks" Daily Activity  Outcome Measure  Difficulty turning over in bed (including adjusting bedclothes, sheets and blankets)?: A Lot Difficulty moving from lying on back to sitting on the side of the bed? : A Lot Difficulty sitting down on and standing up from a chair with arms (e.g., wheelchair, bedside commode, etc,.)?: A Lot Help needed moving to and from a bed to chair (including a wheelchair)?: A Little Help needed walking in hospital room?: A Little Help needed climbing 3-5 steps with a railing? : A Little 6 Click Score:  15    End of Session Equipment Utilized During Treatment: Gait belt Activity Tolerance: Patient tolerated treatment well Patient left: with call bell/phone within reach;with family/visitor present;in chair Nurse Communication: Mobility status PT Visit Diagnosis: Difficulty in walking, not elsewhere classified (R26.2)     Time: 0940-1005 PT Time Calculation (min) (ACUTE ONLY): 25 min  Charges:  $Gait Training: 8-22 mins $Therapeutic Exercise: 8-22 mins $Therapeutic Activity: 8-22 mins                    G Codes:       Pg [phone]    Ebbie Sorenson 04/24/2017, 11:50 AM

## 2017-04-26 DIAGNOSIS — M25661 Stiffness of right knee, not elsewhere classified: Secondary | ICD-10-CM | POA: Diagnosis not present

## 2017-04-27 DIAGNOSIS — M25661 Stiffness of right knee, not elsewhere classified: Secondary | ICD-10-CM | POA: Insufficient documentation

## 2017-04-28 DIAGNOSIS — M25661 Stiffness of right knee, not elsewhere classified: Secondary | ICD-10-CM | POA: Diagnosis not present

## 2017-04-30 DIAGNOSIS — M25661 Stiffness of right knee, not elsewhere classified: Secondary | ICD-10-CM | POA: Diagnosis not present

## 2017-05-05 DIAGNOSIS — M25661 Stiffness of right knee, not elsewhere classified: Secondary | ICD-10-CM | POA: Diagnosis not present

## 2017-05-07 DIAGNOSIS — Z96651 Presence of right artificial knee joint: Secondary | ICD-10-CM | POA: Diagnosis not present

## 2017-05-07 DIAGNOSIS — M1712 Unilateral primary osteoarthritis, left knee: Secondary | ICD-10-CM | POA: Diagnosis not present

## 2017-05-07 DIAGNOSIS — Z471 Aftercare following joint replacement surgery: Secondary | ICD-10-CM | POA: Diagnosis not present

## 2017-05-10 DIAGNOSIS — M25661 Stiffness of right knee, not elsewhere classified: Secondary | ICD-10-CM | POA: Diagnosis not present

## 2017-05-12 ENCOUNTER — Ambulatory Visit: Payer: Self-pay | Admitting: Orthopedic Surgery

## 2017-05-19 ENCOUNTER — Ambulatory Visit: Payer: Self-pay | Admitting: Orthopedic Surgery

## 2017-05-19 NOTE — H&P (Signed)
TOTAL KNEE ADMISSION H&P  Patient is being admitted for left total knee arthroplasty.  Subjective:  Chief Complaint:left knee pain.  HPI: Dominique Griffith, 77 y.o. female, has a history of pain and functional disability in the left knee due to arthritis and has failed non-surgical conservative treatments for greater than 12 weeks to includeNSAID's and/or analgesics, corticosteriod injections, viscosupplementation injections, flexibility and strengthening excercises, use of assistive devices, weight reduction as appropriate and activity modification.  Onset of symptoms was gradual, starting 5 years ago with rapidlly worsening course since that time. The patient noted no past surgery on the left knee(s).  Patient currently rates pain in the left knee(s) at 10 out of 10 with activity. Patient has night pain, worsening of pain with activity and weight bearing, pain that interferes with activities of daily living, pain with passive range of motion, crepitus and joint swelling.  Patient has evidence of subchondral cysts, subchondral sclerosis, periarticular osteophytes and joint space narrowing by imaging studies. There is no active infection.  Patient Active Problem List   Diagnosis Date Noted  . Osteoarthritis of right knee 04/22/2017  . Syncope 01/28/2017   Past Medical History:  Diagnosis Date  . Anxiety   . Complication of anesthesia    BACK SURGERY IN 2010; WAS PUT UNDER GENERAL ANESTHESIA ; REPORTS MEMORY LOSS AND CHANGE IN BEHAVIOR FOR 2 MONTHS POST-OP ; STILL OCCASIONALLY HAS TIMES SHE FORGETS THINGS   . Depression   . GERD (gastroesophageal reflux disease)   . Hypertension   . Syncope and collapse    6 MONTHS AGO HAD SYNCOPE AND COLLAPSE ; SAW CARDIOLOGIST;  HAD UNREMARKABLE ECHO ;     Past Surgical History:  Procedure Laterality Date  . BACK SURGERY  2010   LUMBAR FUSION  WITH RODS IN PLACE  . KNEE ARTHROPLASTY Right 04/22/2017   Procedure: RIGHT TOTAL KNEE ARTHROPLASTY WITH  COMPUTER NAVIGATION;  Surgeon: Rod Can, MD;  Location: [facility];  Service: Orthopedics;  Laterality: Right;  Needs RNFA    Current Outpatient Medications  Medication Sig Dispense Refill Last Dose  . aluminum chloride (DRYSOL) 20 % external solution Apply 1 application topically once a week.   Past Month at Unknown time  . amitriptyline (ELAVIL) 25 MG tablet Take 25 mg by mouth at bedtime.   04/21/2017 at 2200  . aspirin 81 MG chewable tablet Chew 1 tablet (81 mg total) by mouth 2 (two) times daily. 60 tablet 1   . baclofen (LIORESAL) 10 MG tablet Take 10 mg by mouth 3 (three) times daily.   04/22/2017 at 0800  . buPROPion (WELLBUTRIN XL) 150 MG 24 hr tablet Take 150 mg by mouth daily.   04/22/2017 at 0800  . Calcium Carbonate-Vitamin D (CALCIUM-VITAMIN D3 PO) Take 1 tablet by mouth every other day.   Past Week at Unknown time  . cetirizine (ZYRTEC) 10 MG tablet Take 10 mg by mouth daily.   04/22/2017 at 0800  . colchicine 0.6 MG tablet Take 0.6 mg by mouth daily.   04/22/2017 at 0800  . docusate sodium (COLACE) 100 MG capsule Take 1 capsule (100 mg total) by mouth 2 (two) times daily. 60 capsule 1   . escitalopram (LEXAPRO) 20 MG tablet Take 20 mg by mouth daily.   04/22/2017 at 0800  . famotidine (PEPCID) 40 MG tablet Take 40 mg by mouth 2 (two) times daily.    04/22/2017 at 0800  . FIBER PO Take 1 capsule by mouth daily.   Past Week  at Unknown time  . glucosamine-chondroitin 500-400 MG tablet Take 1 tablet by mouth daily.   Past Week at Unknown time  . HYDROcodone-acetaminophen (NORCO/VICODIN) 5-325 MG tablet Take 1-2 tablets by mouth every 4 (four) hours as needed (prn knee pain). 60 tablet 0   . losartan (COZAAR) 100 MG tablet Take 100 mg by mouth daily.   04/22/2017 at 0800  . meloxicam (MOBIC) 15 MG tablet Take 15 mg by mouth daily  3 Past Week at Unknown time  . Multiple Vitamins-Minerals (MULTIVITAMIN PO) Take 1 tablet by mouth daily.   Past Week at Unknown time  . nitroGLYCERIN  (NITRO-BID) 2 % ointment Apply 0.5 inches topically daily as needed (SKIN OF FOOT).    More than a month at Unknown time  . ondansetron (ZOFRAN) 4 MG tablet Take 1 tablet (4 mg total) by mouth every 6 (six) hours as needed for nausea. 20 tablet 0   . PROAIR HFA 108 (90 Base) MCG/ACT inhaler Inhale 2 puffs into the lungs every 6 (six) hours as needed for shortness of breath or wheezing.  0 More than a month at Unknown time  . senna (SENOKOT) 8.6 MG TABS tablet Take 2 tablets (17.2 mg total) by mouth at bedtime. 60 each 3    No current facility-administered medications for this visit.    Allergies  Allergen Reactions  . Amoxicillin Rash  . Penicillins Rash and Other (See Comments)    Has patient had a PCN reaction causing immediate rash, facial/tongue/throat swelling, SOB or lightheadedness with hypotension: No Has patient had a PCN reaction causing severe rash involving mucus membranes or skin necrosis: No Has patient had a PCN reaction that required hospitalization: No Has patient had a PCN reaction occurring within the last 10 years: No If all of the above answers are "NO", then may proceed with Cephalosporin use.    Social History   Tobacco Use  . Smoking status: Former Smoker    Years: 20.00    Types: Cigarettes  . Smokeless tobacco: Never Used  . Tobacco comment: QUIT 30 YEARS AGO   Substance Use Topics  . Alcohol use: Yes    Comment: occ    Family History  Problem Relation Age of Onset  . Stroke Mother   . Heart attack Father      Review of Systems  Constitutional: Negative.   HENT: Negative.   Eyes: Negative.   Respiratory: Negative.   Cardiovascular: Negative.   Gastrointestinal: Positive for heartburn.  Genitourinary: Negative.   Musculoskeletal: Positive for joint pain.  Skin: Negative.   Neurological: Negative.   Endo/Heme/Allergies: Negative.   Psychiatric/Behavioral: Positive for memory loss.    Objective:  Physical Exam  Vitals  reviewed. Constitutional: She is oriented to person, place, and time. She appears well-developed and well-nourished.  HENT:  Head: Normocephalic and atraumatic.  Eyes: Pupils are equal, round, and reactive to light. Conjunctivae and EOM are normal.  Neck: Normal range of motion. Neck supple. No thyromegaly present.  Cardiovascular: Normal rate, regular rhythm and intact distal pulses.  Respiratory: Effort normal. No respiratory distress.  GI: Soft. She exhibits no distension.  Genitourinary:  Genitourinary Comments: deferred  Musculoskeletal:       Left knee: She exhibits decreased range of motion, swelling, effusion, abnormal alignment and bony tenderness. Tenderness found. Medial joint line and lateral joint line tenderness noted.  Neurological: She is alert and oriented to person, place, and time. She has normal reflexes.  Skin: Skin is warm and dry.  Psychiatric: She has a normal mood and affect. Her behavior is normal. Judgment and thought content normal.    Vital signs in last 24 hours: @VSRANGES @  Labs:   Estimated body mass index is 26.57 kg/m as calculated from the following:   Height as of 04/22/17: 5\' 3"  (1.6 m).   Weight as of 04/22/17: 68 kg (150 lb).   Imaging Review Plain radiographs demonstrate severe degenerative joint disease of the left knee(s). The overall alignment issignificant varus. The bone quality appears to be adequate for age and reported activity level.   Preoperative templating of the joint replacement has been completed, documented, and submitted to the Operating Room personnel in order to optimize intra-operative equipment management.   Anticipated LOS equal to or greater than 2 midnights due to - Age 29 and older with one or more of the following:  - Obesity  - Expected need for hospital services (PT, OT, Nursing) required for safe  discharge  - Anticipated need for postoperative skilled nursing care or inpatient rehab  - Active co-morbidities:  None OR   - Unanticipated findings during/Post Surgery: Slow post-op progression: GI, pain control, mobility  - Patient is a high risk of re-admission due to: None     Assessment/Plan:  End stage arthritis, left knee   The patient history, physical examination, clinical judgment of the provider and imaging studies are consistent with end stage degenerative joint disease of the left knee(s) and total knee arthroplasty is deemed medically necessary. The treatment options including medical management, injection therapy arthroscopy and arthroplasty were discussed at length. The risks and benefits of total knee arthroplasty were presented and reviewed. The risks due to aseptic loosening, infection, stiffness, patella tracking problems, thromboembolic complications and other imponderables were discussed. The patient acknowledged the explanation, agreed to proceed with the plan and consent was signed. Patient is being admitted for inpatient treatment for surgery, pain control, PT, OT, prophylactic antibiotics, VTE prophylaxis, progressive ambulation and ADL's and discharge planning. The patient is planning to be discharged home with outpatient PT at [facility].

## 2017-05-19 NOTE — H&P (View-Only) (Signed)
TOTAL KNEE ADMISSION H&P  Patient is being admitted for left total knee arthroplasty.  Subjective:  Chief Complaint:left knee pain.  HPI: Dominique Griffith, 77 y.o. female, has a history of pain and functional disability in the left knee due to arthritis and has failed non-surgical conservative treatments for greater than 12 weeks to includeNSAID's and/or analgesics, corticosteriod injections, viscosupplementation injections, flexibility and strengthening excercises, use of assistive devices, weight reduction as appropriate and activity modification.  Onset of symptoms was gradual, starting 5 years ago with rapidlly worsening course since that time. The patient noted no past surgery on the left knee(s).  Patient currently rates pain in the left knee(s) at 10 out of 10 with activity. Patient has night pain, worsening of pain with activity and weight bearing, pain that interferes with activities of daily living, pain with passive range of motion, crepitus and joint swelling.  Patient has evidence of subchondral cysts, subchondral sclerosis, periarticular osteophytes and joint space narrowing by imaging studies. There is no active infection.  Patient Active Problem List   Diagnosis Date Noted  . Osteoarthritis of right knee 04/22/2017  . Syncope 01/28/2017   Past Medical History:  Diagnosis Date  . Anxiety   . Complication of anesthesia    BACK SURGERY IN 2010; WAS PUT UNDER GENERAL ANESTHESIA ; REPORTS MEMORY LOSS AND CHANGE IN BEHAVIOR FOR 2 MONTHS POST-OP ; STILL OCCASIONALLY HAS TIMES SHE FORGETS THINGS   . Depression   . GERD (gastroesophageal reflux disease)   . Hypertension   . Syncope and collapse    6 MONTHS AGO HAD SYNCOPE AND COLLAPSE ; SAW CARDIOLOGIST;  HAD UNREMARKABLE ECHO ;     Past Surgical History:  Procedure Laterality Date  . BACK SURGERY  2010   LUMBAR FUSION  WITH RODS IN PLACE  . KNEE ARTHROPLASTY Right 04/22/2017   Procedure: RIGHT TOTAL KNEE ARTHROPLASTY WITH  COMPUTER NAVIGATION;  Surgeon: Rod Can, MD;  Location: [facility];  Service: Orthopedics;  Laterality: Right;  Needs RNFA    Current Outpatient Medications  Medication Sig Dispense Refill Last Dose  . aluminum chloride (DRYSOL) 20 % external solution Apply 1 application topically once a week.   Past Month at Unknown time  . amitriptyline (ELAVIL) 25 MG tablet Take 25 mg by mouth at bedtime.   04/21/2017 at 2200  . aspirin 81 MG chewable tablet Chew 1 tablet (81 mg total) by mouth 2 (two) times daily. 60 tablet 1   . baclofen (LIORESAL) 10 MG tablet Take 10 mg by mouth 3 (three) times daily.   04/22/2017 at 0800  . buPROPion (WELLBUTRIN XL) 150 MG 24 hr tablet Take 150 mg by mouth daily.   04/22/2017 at 0800  . Calcium Carbonate-Vitamin D (CALCIUM-VITAMIN D3 PO) Take 1 tablet by mouth every other day.   Past Week at Unknown time  . cetirizine (ZYRTEC) 10 MG tablet Take 10 mg by mouth daily.   04/22/2017 at 0800  . colchicine 0.6 MG tablet Take 0.6 mg by mouth daily.   04/22/2017 at 0800  . docusate sodium (COLACE) 100 MG capsule Take 1 capsule (100 mg total) by mouth 2 (two) times daily. 60 capsule 1   . escitalopram (LEXAPRO) 20 MG tablet Take 20 mg by mouth daily.   04/22/2017 at 0800  . famotidine (PEPCID) 40 MG tablet Take 40 mg by mouth 2 (two) times daily.    04/22/2017 at 0800  . FIBER PO Take 1 capsule by mouth daily.   Past Week  at Unknown time  . glucosamine-chondroitin 500-400 MG tablet Take 1 tablet by mouth daily.   Past Week at Unknown time  . HYDROcodone-acetaminophen (NORCO/VICODIN) 5-325 MG tablet Take 1-2 tablets by mouth every 4 (four) hours as needed (prn knee pain). 60 tablet 0   . losartan (COZAAR) 100 MG tablet Take 100 mg by mouth daily.   04/22/2017 at 0800  . meloxicam (MOBIC) 15 MG tablet Take 15 mg by mouth daily  3 Past Week at Unknown time  . Multiple Vitamins-Minerals (MULTIVITAMIN PO) Take 1 tablet by mouth daily.   Past Week at Unknown time  . nitroGLYCERIN  (NITRO-BID) 2 % ointment Apply 0.5 inches topically daily as needed (SKIN OF FOOT).    More than a month at Unknown time  . ondansetron (ZOFRAN) 4 MG tablet Take 1 tablet (4 mg total) by mouth every 6 (six) hours as needed for nausea. 20 tablet 0   . PROAIR HFA 108 (90 Base) MCG/ACT inhaler Inhale 2 puffs into the lungs every 6 (six) hours as needed for shortness of breath or wheezing.  0 More than a month at Unknown time  . senna (SENOKOT) 8.6 MG TABS tablet Take 2 tablets (17.2 mg total) by mouth at bedtime. 60 each 3    No current facility-administered medications for this visit.    Allergies  Allergen Reactions  . Amoxicillin Rash  . Penicillins Rash and Other (See Comments)    Has patient had a PCN reaction causing immediate rash, facial/tongue/throat swelling, SOB or lightheadedness with hypotension: No Has patient had a PCN reaction causing severe rash involving mucus membranes or skin necrosis: No Has patient had a PCN reaction that required hospitalization: No Has patient had a PCN reaction occurring within the last 10 years: No If all of the above answers are "NO", then may proceed with Cephalosporin use.    Social History   Tobacco Use  . Smoking status: Former Smoker    Years: 20.00    Types: Cigarettes  . Smokeless tobacco: Never Used  . Tobacco comment: QUIT 30 YEARS AGO   Substance Use Topics  . Alcohol use: Yes    Comment: occ    Family History  Problem Relation Age of Onset  . Stroke Mother   . Heart attack Father      Review of Systems  Constitutional: Negative.   HENT: Negative.   Eyes: Negative.   Respiratory: Negative.   Cardiovascular: Negative.   Gastrointestinal: Positive for heartburn.  Genitourinary: Negative.   Musculoskeletal: Positive for joint pain.  Skin: Negative.   Neurological: Negative.   Endo/Heme/Allergies: Negative.   Psychiatric/Behavioral: Positive for memory loss.    Objective:  Physical Exam  Vitals  reviewed. Constitutional: She is oriented to person, place, and time. She appears well-developed and well-nourished.  HENT:  Head: Normocephalic and atraumatic.  Eyes: Pupils are equal, round, and reactive to light. Conjunctivae and EOM are normal.  Neck: Normal range of motion. Neck supple. No thyromegaly present.  Cardiovascular: Normal rate, regular rhythm and intact distal pulses.  Respiratory: Effort normal. No respiratory distress.  GI: Soft. She exhibits no distension.  Genitourinary:  Genitourinary Comments: deferred  Musculoskeletal:       Left knee: She exhibits decreased range of motion, swelling, effusion, abnormal alignment and bony tenderness. Tenderness found. Medial joint line and lateral joint line tenderness noted.  Neurological: She is alert and oriented to person, place, and time. She has normal reflexes.  Skin: Skin is warm and dry.  Psychiatric: She has a normal mood and affect. Her behavior is normal. Judgment and thought content normal.    Vital signs in last 24 hours: @VSRANGES @  Labs:   Estimated body mass index is 26.57 kg/m as calculated from the following:   Height as of 04/22/17: 5\' 3"  (1.6 m).   Weight as of 04/22/17: 68 kg (150 lb).   Imaging Review Plain radiographs demonstrate severe degenerative joint disease of the left knee(s). The overall alignment issignificant varus. The bone quality appears to be adequate for age and reported activity level.   Preoperative templating of the joint replacement has been completed, documented, and submitted to the Operating Room personnel in order to optimize intra-operative equipment management.   Anticipated LOS equal to or greater than 2 midnights due to - Age 51 and older with one or more of the following:  - Obesity  - Expected need for hospital services (PT, OT, Nursing) required for safe  discharge  - Anticipated need for postoperative skilled nursing care or inpatient rehab  - Active co-morbidities:  None OR   - Unanticipated findings during/Post Surgery: Slow post-op progression: GI, pain control, mobility  - Patient is a high risk of re-admission due to: None     Assessment/Plan:  End stage arthritis, left knee   The patient history, physical examination, clinical judgment of the provider and imaging studies are consistent with end stage degenerative joint disease of the left knee(s) and total knee arthroplasty is deemed medically necessary. The treatment options including medical management, injection therapy arthroscopy and arthroplasty were discussed at length. The risks and benefits of total knee arthroplasty were presented and reviewed. The risks due to aseptic loosening, infection, stiffness, patella tracking problems, thromboembolic complications and other imponderables were discussed. The patient acknowledged the explanation, agreed to proceed with the plan and consent was signed. Patient is being admitted for inpatient treatment for surgery, pain control, PT, OT, prophylactic antibiotics, VTE prophylaxis, progressive ambulation and ADL's and discharge planning. The patient is planning to be discharged home with outpatient PT at [facility].

## 2017-05-20 DIAGNOSIS — F3342 Major depressive disorder, recurrent, in full remission: Secondary | ICD-10-CM | POA: Diagnosis not present

## 2017-05-20 DIAGNOSIS — M170 Bilateral primary osteoarthritis of knee: Secondary | ICD-10-CM | POA: Diagnosis not present

## 2017-05-20 DIAGNOSIS — I10 Essential (primary) hypertension: Secondary | ICD-10-CM | POA: Diagnosis not present

## 2017-06-03 ENCOUNTER — Other Ambulatory Visit (HOSPITAL_COMMUNITY): Payer: Self-pay | Admitting: *Deleted

## 2017-06-03 NOTE — Patient Instructions (Addendum)
Dominique Griffith  06/03/2017   Your procedure is scheduled on: 06-10-17  Report to [facility] Main  Entrance Arrive at Admitting at 5:30 AM    Call this number if you have problems the morning of surgery [phone]   Remember: Do not eat food or drink liquids :After Midnight.     Take these medicines the morning of surgery with A SIP OF WATER: BUPROPION (WELLBUTRIN XL), ESCITALOPRAM ( LEXAPRO), CERTRIZINE (ZYRTEC) COLCHICINE, and FAMOTIDINE (PEPCID)                               You may not have any metal on your body including hair pins and              piercings  Do not wear jewelry, make-up, lotions, powders or perfumes, deodorant             Do not wear nail polish.  Do not shave  48 hours prior to surgery.              Do not bring valuables to the hospital. [place].  Contacts, dentures or bridgework may not be worn into surgery.  Leave suitcase in the car. After surgery it may be brought to your room.                  Please read over the following fact sheets you were given: _____________________________________________________________________             [facility] - Preparing for Surgery Before surgery, you can play an important role.  Because skin is not sterile, your skin needs to be as free of germs as possible.  You can reduce the number of germs on your skin by washing with CHG (chlorahexidine gluconate) soap before surgery.  CHG is an antiseptic cleaner which kills germs and bonds with the skin to continue killing germs even after washing. Please DO NOT use if you have an allergy to CHG or antibacterial soaps.  If your skin becomes reddened/irritated stop using the CHG and inform your nurse when you arrive at Short Stay. Do not shave (including legs and underarms) for at least 48 hours prior to the first CHG shower.  You may shave your face/neck. Please follow these instructions  carefully:  1.  Shower with CHG Soap the night before surgery and the  morning of Surgery.  2.  If you choose to wash your hair, wash your hair first as usual with your  normal  shampoo.  3.  After you shampoo, rinse your hair and body thoroughly to remove the  shampoo.                           4.  Use CHG as you would any other liquid soap.  You can apply chg directly  to the skin and wash                       Gently with a scrungie or clean washcloth.  5.  Apply the CHG Soap to your body ONLY FROM THE NECK DOWN.   Do not use on face/ open  Wound or open sores. Avoid contact with eyes, ears mouth and genitals (private parts).                       Wash face,  Genitals (private parts) with your normal soap.             6.  Wash thoroughly, paying special attention to the area where your surgery  will be performed.  7.  Thoroughly rinse your body with warm water from the neck down.  8.  DO NOT shower/wash with your normal soap after using and rinsing off  the CHG Soap.                9.  Pat yourself dry with a clean towel.            10.  Wear clean pajamas.            11.  Place clean sheets on your bed the night of your first shower and do not  sleep with pets. Day of Surgery : Do not apply any lotions/deodorants the morning of surgery.  Please wear clean clothes to the hospital/surgery center.  FAILURE TO FOLLOW THESE INSTRUCTIONS MAY RESULT IN THE CANCELLATION OF YOUR SURGERY PATIENT SIGNATURE_________________________________  NURSE SIGNATURE__________________________________  ________________________________________________________________________   Dominique Griffith  An incentive spirometer is a tool that can help keep your lungs clear and active. This tool measures how well you are filling your lungs with each breath. Taking long deep breaths may help reverse or decrease the chance of developing breathing (pulmonary) problems (especially infection)  following:  A long period of time when you are unable to move or be active. BEFORE THE PROCEDURE   If the spirometer includes an indicator to show your best effort, your nurse or respiratory therapist will set it to a desired goal.  If possible, sit up straight or lean slightly forward. Try not to slouch.  Hold the incentive spirometer in an upright position. INSTRUCTIONS FOR USE  1. Sit on the edge of your bed if possible, or sit up as far as you can in bed or on a chair. 2. Hold the incentive spirometer in an upright position. 3. Breathe out normally. 4. Place the mouthpiece in your mouth and seal your lips tightly around it. 5. Breathe in slowly and as deeply as possible, raising the piston or the ball toward the top of the column. 6. Hold your breath for 3-5 seconds or for as long as possible. Allow the piston or ball to fall to the bottom of the column. 7. Remove the mouthpiece from your mouth and breathe out normally. 8. Rest for a few seconds and repeat Steps 1 through 7 at least 10 times every 1-2 hours when you are awake. Take your time and take a few normal breaths between deep breaths. 9. The spirometer may include an indicator to show your best effort. Use the indicator as a goal to work toward during each repetition. 10. After each set of 10 deep breaths, practice coughing to be sure your lungs are clear. If you have an incision (the cut made at the time of surgery), support your incision when coughing by placing a pillow or rolled up towels firmly against it. Once you are able to get out of bed, walk around indoors and cough well. You may stop using the incentive spirometer when instructed by your caregiver.  RISKS AND COMPLICATIONS  Take your time so you do not get  dizzy or light-headed.  If you are in pain, you may need to take or ask for pain medication before doing incentive spirometry. It is harder to take a deep breath if you are having pain. AFTER USE  Rest and  breathe slowly and easily.  It can be helpful to keep track of a log of your progress. Your caregiver can provide you with a simple table to help with this. If you are using the spirometer at home, follow these instructions: [place] IF:   You are having difficultly using the spirometer.  You have trouble using the spirometer as often as instructed.  Your pain medication is not giving enough relief while using the spirometer.  You develop fever of 100.5 F (38.1 C) or higher. SEEK IMMEDIATE MEDICAL CARE IF:   You cough up bloody sputum that had not been present before.  You develop fever of 102 F (38.9 C) or greater.  You develop worsening pain at or near the incision site. MAKE SURE YOU:   Understand these instructions.  Will watch your condition.  Will get help right away if you are not doing well or get worse. Document Released: 06/22/2006 Document Revised: 05/04/2011 Document Reviewed: 08/23/2006 ExitCare Patient Information 2014 ExitCare, [place].   ________________________________________________________________________  WHAT IS A BLOOD TRANSFUSION? Blood Transfusion Information  A transfusion is the replacement of blood or some of its parts. Blood is made up of multiple cells which provide different functions.  Red blood cells carry oxygen and are used for blood loss replacement.  White blood cells fight against infection.  Platelets control bleeding.  Plasma helps clot blood.  Other blood products are available for specialized needs, such as hemophilia or other clotting disorders. BEFORE THE TRANSFUSION  Who gives blood for transfusions?   Healthy volunteers who are fully evaluated to make sure their blood is safe. This is blood bank blood. Transfusion therapy is the safest it has ever been in the practice of medicine. Before blood is taken from a donor, a complete history is taken to make sure that person has no history of diseases nor engages in  risky social behavior (examples are intravenous drug use or sexual activity with multiple partners). The donor's travel history is screened to minimize risk of transmitting infections, such as malaria. The donated blood is tested for signs of infectious diseases, such as HIV and hepatitis. The blood is then tested to be sure it is compatible with you in order to minimize the chance of a transfusion reaction. If you or a relative donates blood, this is often done in anticipation of surgery and is not appropriate for emergency situations. It takes many days to process the donated blood. RISKS AND COMPLICATIONS Although transfusion therapy is very safe and saves many lives, the main dangers of transfusion include:   Getting an infectious disease.  Developing a transfusion reaction. This is an allergic reaction to something in the blood you were given. Every precaution is taken to prevent this. The decision to have a blood transfusion has been considered carefully by your caregiver before blood is given. Blood is not given unless the benefits outweigh the risks. AFTER THE TRANSFUSION  Right after receiving a blood transfusion, you will usually feel much better and more energetic. This is especially true if your red blood cells have gotten low (anemic). The transfusion raises the level of the red blood cells which carry oxygen, and this usually causes an energy increase.  The nurse administering the transfusion will  monitor you carefully for complications. HOME CARE INSTRUCTIONS  No special instructions are needed after a transfusion. You may find your energy is better. Speak with your caregiver about any limitations on activity for underlying diseases you may have. SEEK MEDICAL CARE IF:   Your condition is not improving after your transfusion.  You develop redness or irritation at the intravenous (IV) site. SEEK IMMEDIATE MEDICAL CARE IF:  Any of the following symptoms occur over the next 12  hours:  Shaking chills.  You have a temperature by mouth above 102 F (38.9 C), not controlled by medicine.  Chest, back, or muscle pain.  People around you feel you are not acting correctly or are confused.  Shortness of breath or difficulty breathing.  Dizziness and fainting.  You get a rash or develop hives.  You have a decrease in urine output.  Your urine turns a dark color or changes to pink, red, or brown. Any of the following symptoms occur over the next 10 days:  You have a temperature by mouth above 102 F (38.9 C), not controlled by medicine.  Shortness of breath.  Weakness after normal activity.  The white part of the eye turns yellow (jaundice).  You have a decrease in the amount of urine or are urinating less often.  Your urine turns a dark color or changes to pink, red, or brown. Document Released: 02/07/2000 Document Revised: 05/04/2011 Document Reviewed: 09/26/2007 [facility] Patient Information 2014 [place], [place].  _______________________________________________________________________

## 2017-06-03 NOTE — Progress Notes (Signed)
EKG 10-15-16 Epic LOV NEUROLOGY 01-20-17 Epic ECHO 1318 Epic

## 2017-06-04 ENCOUNTER — Encounter (HOSPITAL_COMMUNITY)
Admission: RE | Admit: 2017-06-04 | Discharge: 2017-06-04 | Disposition: A | Discharge status: Home / Self Care | Payer: PPO | Source: Ambulatory Visit | Attending: Orthopedic Surgery | Admitting: Orthopedic Surgery

## 2017-06-04 ENCOUNTER — Other Ambulatory Visit: Payer: Self-pay

## 2017-06-04 ENCOUNTER — Encounter (HOSPITAL_COMMUNITY): Payer: Self-pay

## 2017-06-04 DIAGNOSIS — Z01818 Encounter for other preprocedural examination: Secondary | ICD-10-CM | POA: Insufficient documentation

## 2017-06-04 DIAGNOSIS — M1712 Unilateral primary osteoarthritis, left knee: Secondary | ICD-10-CM | POA: Insufficient documentation

## 2017-06-04 LAB — BASIC METABOLIC PANEL
Anion gap: 11 (ref 5–15)
BUN: 13 mg/dL (ref 6–20)
CALCIUM: 9.3 mg/dL (ref 8.9–10.3)
CO2: 26 mmol/L (ref 22–32)
CREATININE: 0.76 mg/dL (ref 0.44–1.00)
Chloride: 103 mmol/L (ref 101–111)
GFR calc Af Amer: >60 mL/min (ref >60)
GFR calc non Af Amer: >60 mL/min (ref >60)
GLUCOSE: 85 mg/dL (ref 65–99)
Potassium: 4.5 mmol/L (ref 3.5–5.1)
Sodium: 140 mmol/L (ref 135–145)

## 2017-06-04 LAB — CBC
HCT: 37.6 % (ref 36.0–46.0)
Hemoglobin: 12 g/dL (ref 12.0–15.0)
MCH: 31.4 pg (ref 26.0–34.0)
MCHC: 31.9 g/dL (ref 30.0–36.0)
MCV: 98.4 fL (ref 78.0–100.0)
PLATELETS: 313 10*3/uL (ref 150–400)
RBC: 3.82 MIL/uL — ABNORMAL LOW (ref 3.87–5.11)
RDW: 13.5 % (ref 11.5–15.5)
WBC: 5.4 10*3/uL (ref 4.0–10.5)

## 2017-06-04 LAB — SURGICAL PCR SCREEN
MRSA, PCR: NEGATIVE
Staphylococcus aureus: NEGATIVE

## 2017-06-09 MED ORDER — TRANEXAMIC ACID 1000 MG/10ML IV SOLN
1000.0000 mg | INTRAVENOUS | Status: AC
  Administered 2017-06-10: 1000 mg via INTRAVENOUS
  Filled 2017-06-09: qty 1100

## 2017-06-10 ENCOUNTER — Encounter (HOSPITAL_COMMUNITY)
Admission: RE | Disposition: A | Discharge status: Home / Self Care | Payer: Self-pay | Source: Ambulatory Visit | Attending: Orthopedic Surgery

## 2017-06-10 ENCOUNTER — Inpatient Hospital Stay (HOSPITAL_COMMUNITY): Payer: PPO

## 2017-06-10 ENCOUNTER — Inpatient Hospital Stay (HOSPITAL_COMMUNITY): Payer: PPO | Admitting: Certified Registered Nurse Anesthetist

## 2017-06-10 ENCOUNTER — Encounter (HOSPITAL_COMMUNITY): Payer: Self-pay | Admitting: *Deleted

## 2017-06-10 ENCOUNTER — Other Ambulatory Visit: Payer: Self-pay

## 2017-06-10 ENCOUNTER — Inpatient Hospital Stay (HOSPITAL_COMMUNITY)
Admission: RE | Admit: 2017-06-10 | Discharge: 2017-06-12 | DRG: 470 | Disposition: A | Discharge status: Home / Self Care | Payer: PPO | Source: Ambulatory Visit | Attending: Orthopedic Surgery | Admitting: Orthopedic Surgery

## 2017-06-10 DIAGNOSIS — Z880 Allergy status to penicillin: Secondary | ICD-10-CM

## 2017-06-10 DIAGNOSIS — Z79891 Long term (current) use of opiate analgesic: Secondary | ICD-10-CM | POA: Diagnosis not present

## 2017-06-10 DIAGNOSIS — R12 Heartburn: Secondary | ICD-10-CM | POA: Diagnosis not present

## 2017-06-10 DIAGNOSIS — R413 Other amnesia: Secondary | ICD-10-CM | POA: Diagnosis not present

## 2017-06-10 DIAGNOSIS — I10 Essential (primary) hypertension: Secondary | ICD-10-CM | POA: Diagnosis not present

## 2017-06-10 DIAGNOSIS — Z8249 Family history of ischemic heart disease and other diseases of the circulatory system: Secondary | ICD-10-CM | POA: Diagnosis not present

## 2017-06-10 DIAGNOSIS — K219 Gastro-esophageal reflux disease without esophagitis: Secondary | ICD-10-CM | POA: Diagnosis present

## 2017-06-10 DIAGNOSIS — F419 Anxiety disorder, unspecified: Secondary | ICD-10-CM | POA: Diagnosis present

## 2017-06-10 DIAGNOSIS — Z87891 Personal history of nicotine dependence: Secondary | ICD-10-CM | POA: Diagnosis not present

## 2017-06-10 DIAGNOSIS — Z7982 Long term (current) use of aspirin: Secondary | ICD-10-CM | POA: Diagnosis not present

## 2017-06-10 DIAGNOSIS — Z79899 Other long term (current) drug therapy: Secondary | ICD-10-CM

## 2017-06-10 DIAGNOSIS — Z881 Allergy status to other antibiotic agents status: Secondary | ICD-10-CM

## 2017-06-10 DIAGNOSIS — G8918 Other acute postprocedural pain: Secondary | ICD-10-CM | POA: Diagnosis not present

## 2017-06-10 DIAGNOSIS — Z791 Long term (current) use of non-steroidal anti-inflammatories (NSAID): Secondary | ICD-10-CM | POA: Diagnosis not present

## 2017-06-10 DIAGNOSIS — M1712 Unilateral primary osteoarthritis, left knee: Secondary | ICD-10-CM | POA: Diagnosis not present

## 2017-06-10 DIAGNOSIS — Z96651 Presence of right artificial knee joint: Secondary | ICD-10-CM | POA: Diagnosis not present

## 2017-06-10 DIAGNOSIS — Z823 Family history of stroke: Secondary | ICD-10-CM

## 2017-06-10 DIAGNOSIS — F329 Major depressive disorder, single episode, unspecified: Secondary | ICD-10-CM | POA: Diagnosis present

## 2017-06-10 DIAGNOSIS — Z471 Aftercare following joint replacement surgery: Secondary | ICD-10-CM | POA: Diagnosis not present

## 2017-06-10 DIAGNOSIS — Z96652 Presence of left artificial knee joint: Secondary | ICD-10-CM | POA: Diagnosis not present

## 2017-06-10 HISTORY — PX: KNEE ARTHROPLASTY: SHX992

## 2017-06-10 LAB — TYPE AND SCREEN
ABO/RH(D): O POS
Antibody Screen: NEGATIVE

## 2017-06-10 SURGERY — ARTHROPLASTY, KNEE, TOTAL, USING IMAGELESS COMPUTER-ASSISTED NAVIGATION
Anesthesia: Spinal | Site: Knee | Laterality: Left

## 2017-06-10 MED ORDER — SODIUM CHLORIDE 0.9 % IJ SOLN
INTRAMUSCULAR | Status: AC
  Filled 2017-06-10: qty 50

## 2017-06-10 MED ORDER — BUPIVACAINE IN DEXTROSE 0.75-8.25 % IT SOLN
INTRATHECAL | Status: DC | PRN
  Administered 2017-06-10: 1.8 mL via INTRATHECAL

## 2017-06-10 MED ORDER — SODIUM CHLORIDE 0.9 % IR SOLN
Status: DC | PRN
  Administered 2017-06-10: 1000 mL

## 2017-06-10 MED ORDER — METOCLOPRAMIDE HCL 5 MG/ML IJ SOLN: 5.0000 mg | Freq: Three times a day (TID) | INTRAMUSCULAR | Status: DC | PRN

## 2017-06-10 MED ORDER — SODIUM CHLORIDE 0.9 % IR SOLN
Status: DC | PRN
  Administered 2017-06-10: 3000 mL

## 2017-06-10 MED ORDER — PHENYLEPHRINE 40 MCG/ML (10ML) SYRINGE FOR IV PUSH (FOR BLOOD PRESSURE SUPPORT)
PREFILLED_SYRINGE | INTRAVENOUS | Status: DC | PRN
  Administered 2017-06-10 (×8): 80 ug via INTRAVENOUS
  Administered 2017-06-10: 160 ug via INTRAVENOUS

## 2017-06-10 MED ORDER — AMITRIPTYLINE HCL 25 MG PO TABS: 25.0000 mg | ORAL_TABLET | Freq: Every day | ORAL | Status: DC

## 2017-06-10 MED ORDER — METHOCARBAMOL 500 MG PO TABS
500.0000 mg | ORAL_TABLET | Freq: Four times a day (QID) | ORAL | Status: DC | PRN
  Administered 2017-06-10 – 2017-06-12 (×4): 500 mg via ORAL
  Filled 2017-06-10 (×4): qty 1

## 2017-06-10 MED ORDER — COLCHICINE 0.6 MG PO TABS
0.6000 mg | ORAL_TABLET | Freq: Every day | ORAL | Status: DC
  Administered 2017-06-11 – 2017-06-12 (×2): 0.6 mg via ORAL
  Filled 2017-06-10 (×2): qty 1

## 2017-06-10 MED ORDER — ALUM & MAG HYDROXIDE-SIMETH 200-200-20 MG/5ML PO SUSP: 30.0000 mL | ORAL | Status: DC | PRN

## 2017-06-10 MED ORDER — PHENYLEPHRINE 40 MCG/ML (10ML) SYRINGE FOR IV PUSH (FOR BLOOD PRESSURE SUPPORT)
PREFILLED_SYRINGE | INTRAVENOUS | Status: AC
  Filled 2017-06-10: qty 10

## 2017-06-10 MED ORDER — ONDANSETRON HCL 4 MG/2ML IJ SOLN: 4.0000 mg | Freq: Four times a day (QID) | INTRAMUSCULAR | Status: DC | PRN

## 2017-06-10 MED ORDER — CLINDAMYCIN PHOSPHATE 600 MG/50ML IV SOLN
600.0000 mg | Freq: Four times a day (QID) | INTRAVENOUS | Status: AC
  Administered 2017-06-10 (×2): 600 mg via INTRAVENOUS
  Filled 2017-06-10 (×2): qty 50

## 2017-06-10 MED ORDER — ASPIRIN 81 MG PO CHEW
81.0000 mg | CHEWABLE_TABLET | Freq: Two times a day (BID) | ORAL | Status: DC
  Administered 2017-06-10 – 2017-06-12 (×4): 81 mg via ORAL
  Filled 2017-06-10 (×4): qty 1

## 2017-06-10 MED ORDER — LORATADINE 10 MG PO TABS
10.0000 mg | ORAL_TABLET | Freq: Every day | ORAL | Status: DC
  Administered 2017-06-11 – 2017-06-12 (×2): 10 mg via ORAL
  Filled 2017-06-10 (×2): qty 1

## 2017-06-10 MED ORDER — ISOPROPYL ALCOHOL 70 % SOLN
Status: AC
  Filled 2017-06-10: qty 480

## 2017-06-10 MED ORDER — LOSARTAN POTASSIUM 50 MG PO TABS
100.0000 mg | ORAL_TABLET | Freq: Every day | ORAL | Status: DC
  Administered 2017-06-12: 100 mg via ORAL
  Filled 2017-06-10 (×2): qty 2

## 2017-06-10 MED ORDER — ISOPROPYL ALCOHOL 70 % SOLN
Status: DC | PRN
  Administered 2017-06-10: 1 via TOPICAL

## 2017-06-10 MED ORDER — PROPOFOL 10 MG/ML IV BOLUS
INTRAVENOUS | Status: AC
  Filled 2017-06-10: qty 40

## 2017-06-10 MED ORDER — DEXAMETHASONE SODIUM PHOSPHATE 10 MG/ML IJ SOLN
INTRAMUSCULAR | Status: DC | PRN
  Administered 2017-06-10: 10 mg via INTRAVENOUS

## 2017-06-10 MED ORDER — SENNA 8.6 MG PO TABS
2.0000 | ORAL_TABLET | Freq: Every day | ORAL | Status: DC
  Administered 2017-06-10 – 2017-06-11 (×2): 17.2 mg via ORAL
  Filled 2017-06-10 (×2): qty 2

## 2017-06-10 MED ORDER — FAMOTIDINE 20 MG PO TABS
40.0000 mg | ORAL_TABLET | Freq: Two times a day (BID) | ORAL | Status: DC
  Administered 2017-06-10 – 2017-06-12 (×4): 40 mg via ORAL
  Filled 2017-06-10 (×4): qty 2

## 2017-06-10 MED ORDER — STERILE WATER FOR IRRIGATION IR SOLN
Status: DC | PRN
  Administered 2017-06-10: 2000 mL

## 2017-06-10 MED ORDER — KETOROLAC TROMETHAMINE 30 MG/ML IJ SOLN
INTRAMUSCULAR | Status: DC | PRN
  Administered 2017-06-10: 30 mg

## 2017-06-10 MED ORDER — POVIDONE-IODINE 10 % EX SWAB: 2.0000 "application " | Freq: Once | CUTANEOUS | Status: DC

## 2017-06-10 MED ORDER — PHENOL 1.4 % MT LIQD
1.0000 | OROMUCOSAL | Status: DC | PRN
  Filled 2017-06-10: qty 177

## 2017-06-10 MED ORDER — ACETAMINOPHEN 10 MG/ML IV SOLN
1000.0000 mg | INTRAVENOUS | Status: AC
  Administered 2017-06-10: 1000 mg via INTRAVENOUS
  Filled 2017-06-10: qty 100

## 2017-06-10 MED ORDER — CLINDAMYCIN PHOSPHATE 900 MG/50ML IV SOLN
900.0000 mg | INTRAVENOUS | Status: AC
  Administered 2017-06-10: 900 mg via INTRAVENOUS
  Filled 2017-06-10: qty 50

## 2017-06-10 MED ORDER — HYDROCODONE-ACETAMINOPHEN 5-325 MG PO TABS
1.0000 | ORAL_TABLET | ORAL | Status: DC | PRN
  Administered 2017-06-10: 2 via ORAL
  Administered 2017-06-10 (×2): 1 via ORAL
  Administered 2017-06-10 – 2017-06-11 (×3): 2 via ORAL
  Filled 2017-06-10: qty 1
  Filled 2017-06-10 (×4): qty 2
  Filled 2017-06-10: qty 1

## 2017-06-10 MED ORDER — DIPHENHYDRAMINE HCL 12.5 MG/5ML PO ELIX: 12.5000 mg | ORAL_SOLUTION | ORAL | Status: DC | PRN

## 2017-06-10 MED ORDER — METOCLOPRAMIDE HCL 5 MG PO TABS: 5.0000 mg | ORAL_TABLET | Freq: Three times a day (TID) | ORAL | Status: DC | PRN

## 2017-06-10 MED ORDER — BUPROPION HCL ER (XL) 150 MG PO TB24: 150.0000 mg | ORAL_TABLET | Freq: Every day | ORAL | Status: DC

## 2017-06-10 MED ORDER — BACLOFEN 10 MG PO TABS
10.0000 mg | ORAL_TABLET | Freq: Two times a day (BID) | ORAL | Status: DC
  Administered 2017-06-10 – 2017-06-12 (×4): 10 mg via ORAL
  Filled 2017-06-10 (×4): qty 1

## 2017-06-10 MED ORDER — AMITRIPTYLINE HCL 25 MG PO TABS
12.5000 mg | ORAL_TABLET | Freq: Every day | ORAL | Status: DC
  Administered 2017-06-10 – 2017-06-11 (×2): 12.5 mg via ORAL
  Filled 2017-06-10 (×2): qty 0.5

## 2017-06-10 MED ORDER — MIDAZOLAM HCL 2 MG/2ML IJ SOLN
INTRAMUSCULAR | Status: AC
  Filled 2017-06-10: qty 2

## 2017-06-10 MED ORDER — BUPIVACAINE-EPINEPHRINE 0.25% -1:200000 IJ SOLN
INTRAMUSCULAR | Status: DC | PRN
  Administered 2017-06-10: 30 mL

## 2017-06-10 MED ORDER — POLYETHYLENE GLYCOL 3350 17 G PO PACK: 17.0000 g | PACK | Freq: Every day | ORAL | Status: DC | PRN

## 2017-06-10 MED ORDER — KETOROLAC TROMETHAMINE 30 MG/ML IJ SOLN
INTRAMUSCULAR | Status: AC
  Filled 2017-06-10: qty 1

## 2017-06-10 MED ORDER — METHOCARBAMOL 1000 MG/10ML IJ SOLN
500.0000 mg | Freq: Four times a day (QID) | INTRAVENOUS | Status: DC | PRN
  Administered 2017-06-10: 500 mg via INTRAVENOUS
  Filled 2017-06-10: qty 550

## 2017-06-10 MED ORDER — LIDOCAINE 2% (20 MG/ML) 5 ML SYRINGE
INTRAMUSCULAR | Status: AC
  Filled 2017-06-10: qty 5

## 2017-06-10 MED ORDER — HYDROMORPHONE HCL 1 MG/ML IJ SOLN
1.0000 mg | INTRAMUSCULAR | Status: DC | PRN
  Administered 2017-06-11: 1 mg via INTRAVENOUS
  Filled 2017-06-10: qty 1

## 2017-06-10 MED ORDER — DOCUSATE SODIUM 100 MG PO CAPS
100.0000 mg | ORAL_CAPSULE | Freq: Two times a day (BID) | ORAL | Status: DC
  Administered 2017-06-10 – 2017-06-12 (×4): 100 mg via ORAL
  Filled 2017-06-10 (×4): qty 1

## 2017-06-10 MED ORDER — FENTANYL CITRATE (PF) 100 MCG/2ML IJ SOLN: 25.0000 ug | INTRAMUSCULAR | Status: DC | PRN

## 2017-06-10 MED ORDER — MENTHOL 3 MG MT LOZG: 1.0000 | LOZENGE | OROMUCOSAL | Status: DC | PRN

## 2017-06-10 MED ORDER — EPHEDRINE SULFATE-NACL 50-0.9 MG/10ML-% IV SOSY
PREFILLED_SYRINGE | INTRAVENOUS | Status: DC | PRN
  Administered 2017-06-10: 10 mg via INTRAVENOUS
  Administered 2017-06-10 (×2): 5 mg via INTRAVENOUS
  Administered 2017-06-10: 10 mg via INTRAVENOUS
  Administered 2017-06-10: 5 mg via INTRAVENOUS
  Administered 2017-06-10: 10 mg via INTRAVENOUS
  Administered 2017-06-10: 5 mg via INTRAVENOUS

## 2017-06-10 MED ORDER — SODIUM CHLORIDE 0.9 % IV SOLN: INTRAVENOUS | Status: DC

## 2017-06-10 MED ORDER — SODIUM CHLORIDE 0.9 % IV SOLN
INTRAVENOUS | Status: DC
  Administered 2017-06-10 – 2017-06-11 (×2): via INTRAVENOUS

## 2017-06-10 MED ORDER — BUPROPION HCL ER (XL) 150 MG PO TB24: 150.0000 mg | ORAL_TABLET | Freq: Two times a day (BID) | ORAL | Status: DC

## 2017-06-10 MED ORDER — ONDANSETRON HCL 4 MG PO TABS: 4.0000 mg | ORAL_TABLET | Freq: Four times a day (QID) | ORAL | Status: DC | PRN

## 2017-06-10 MED ORDER — FENTANYL CITRATE (PF) 100 MCG/2ML IJ SOLN
INTRAMUSCULAR | Status: DC | PRN
  Administered 2017-06-10: 100 ug via INTRAVENOUS

## 2017-06-10 MED ORDER — BACLOFEN 10 MG PO TABS: 10.0000 mg | ORAL_TABLET | Freq: Three times a day (TID) | ORAL | Status: DC

## 2017-06-10 MED ORDER — ESCITALOPRAM OXALATE 20 MG PO TABS
20.0000 mg | ORAL_TABLET | Freq: Every day | ORAL | Status: DC
  Administered 2017-06-11 – 2017-06-12 (×2): 20 mg via ORAL
  Filled 2017-06-10 (×2): qty 1

## 2017-06-10 MED ORDER — PROPOFOL 10 MG/ML IV BOLUS
INTRAVENOUS | Status: AC
  Filled 2017-06-10: qty 20

## 2017-06-10 MED ORDER — LACTATED RINGERS IV SOLN
INTRAVENOUS | Status: DC
  Administered 2017-06-10 (×2): via INTRAVENOUS

## 2017-06-10 MED ORDER — CHLORHEXIDINE GLUCONATE 4 % EX LIQD: 60.0000 mL | Freq: Once | CUTANEOUS | Status: DC

## 2017-06-10 MED ORDER — SODIUM CHLORIDE 0.9 % IJ SOLN
INTRAMUSCULAR | Status: DC | PRN
  Administered 2017-06-10: 30 mL

## 2017-06-10 MED ORDER — MIDAZOLAM HCL 5 MG/5ML IJ SOLN
INTRAMUSCULAR | Status: DC | PRN
  Administered 2017-06-10: 2 mg via INTRAVENOUS

## 2017-06-10 MED ORDER — BUPIVACAINE HCL (PF) 0.25 % IJ SOLN
INTRAMUSCULAR | Status: AC
  Filled 2017-06-10: qty 30

## 2017-06-10 MED ORDER — DEXAMETHASONE SODIUM PHOSPHATE 10 MG/ML IJ SOLN
10.0000 mg | Freq: Once | INTRAMUSCULAR | Status: AC
  Administered 2017-06-11: 10 mg via INTRAVENOUS
  Filled 2017-06-10: qty 1

## 2017-06-10 MED ORDER — EPHEDRINE 5 MG/ML INJ
INTRAVENOUS | Status: AC
  Filled 2017-06-10: qty 10

## 2017-06-10 MED ORDER — BUPROPION HCL ER (XL) 150 MG PO TB24
150.0000 mg | ORAL_TABLET | Freq: Two times a day (BID) | ORAL | Status: DC
  Administered 2017-06-10 – 2017-06-12 (×4): 150 mg via ORAL
  Filled 2017-06-10 (×4): qty 1

## 2017-06-10 MED ORDER — FENTANYL CITRATE (PF) 100 MCG/2ML IJ SOLN
INTRAMUSCULAR | Status: AC
  Filled 2017-06-10: qty 2

## 2017-06-10 MED ORDER — PROPOFOL 10 MG/ML IV BOLUS
INTRAVENOUS | Status: DC | PRN
  Administered 2017-06-10: 30 mg via INTRAVENOUS
  Administered 2017-06-10: 20 mg via INTRAVENOUS

## 2017-06-10 MED ORDER — PROPOFOL 500 MG/50ML IV EMUL
INTRAVENOUS | Status: DC | PRN
  Administered 2017-06-10: 100 ug/kg/min via INTRAVENOUS

## 2017-06-10 SURGICAL SUPPLY — 73 items
BAG ZIPLOCK 12X15 (MISCELLANEOUS) IMPLANT
BANDAGE ACE 4X5 VEL STRL LF (GAUZE/BANDAGES/DRESSINGS) ×2 IMPLANT
BANDAGE ACE 6X5 VEL STRL LF (GAUZE/BANDAGES/DRESSINGS) ×2 IMPLANT
BATTERY INSTRU NAVIGATION (MISCELLANEOUS) ×6 IMPLANT
BLADE SAW RECIPROCATING 77.5 (BLADE) ×2 IMPLANT
CHLORAPREP W/TINT 26ML (MISCELLANEOUS) ×4 IMPLANT
COVER SURGICAL LIGHT HANDLE (MISCELLANEOUS) ×2 IMPLANT
CUFF TOURN SGL QUICK 34 (TOURNIQUET CUFF) ×1
CUFF TRNQT CYL 34X4X40X1 (TOURNIQUET CUFF) ×1 IMPLANT
DECANTER SPIKE VIAL GLASS SM (MISCELLANEOUS) ×2 IMPLANT
DERMABOND ADVANCED (GAUZE/BANDAGES/DRESSINGS) ×1
DERMABOND ADVANCED .7 DNX12 (GAUZE/BANDAGES/DRESSINGS) ×1 IMPLANT
DRAPE SHEET LG 3/4 BI-LAMINATE (DRAPES) ×4 IMPLANT
DRAPE U-SHAPE 47X51 STRL (DRAPES) ×2 IMPLANT
DRSG AQUACEL AG ADV 3.5X10 (GAUZE/BANDAGES/DRESSINGS) ×2 IMPLANT
DRSG TEGADERM 4X4.75 (GAUZE/BANDAGES/DRESSINGS) IMPLANT
ELECT BLADE TIP CTD 4 INCH (ELECTRODE) ×2 IMPLANT
ELECT REM PT RETURN 15FT ADLT (MISCELLANEOUS) ×2 IMPLANT
EVACUATOR 1/8 PVC DRAIN (DRAIN) IMPLANT
FEMORAL RETAINING CRUC KNEE #4 (Knees) ×2 IMPLANT
GAUZE SPONGE 4X4 12PLY STRL (GAUZE/BANDAGES/DRESSINGS) ×2 IMPLANT
GLOVE BIO SURGEON STRL SZ8.5 (GLOVE) ×4 IMPLANT
GLOVE BIOGEL M STRL SZ7.5 (GLOVE) ×2 IMPLANT
GLOVE BIOGEL PI IND STRL 7.0 (GLOVE) ×1 IMPLANT
GLOVE BIOGEL PI IND STRL 7.5 (GLOVE) ×2 IMPLANT
GLOVE BIOGEL PI IND STRL 8 (GLOVE) ×1 IMPLANT
GLOVE BIOGEL PI IND STRL 8.5 (GLOVE) ×2 IMPLANT
GLOVE BIOGEL PI INDICATOR 7.0 (GLOVE) ×1
GLOVE BIOGEL PI INDICATOR 7.5 (GLOVE) ×2
GLOVE BIOGEL PI INDICATOR 8 (GLOVE) ×1
GLOVE BIOGEL PI INDICATOR 8.5 (GLOVE) ×2
GLOVE ECLIPSE 7.5 STRL STRAW (GLOVE) ×2 IMPLANT
GLOVE SURG SS PI 7.5 STRL IVOR (GLOVE) ×2 IMPLANT
GOWN BRE IMP SLV SIRUS LXLNG (GOWN DISPOSABLE) ×2 IMPLANT
GOWN SPEC L3 XXLG W/TWL (GOWN DISPOSABLE) ×2 IMPLANT
GOWN STRL REUS W/TWL XL LVL3 (GOWN DISPOSABLE) ×6 IMPLANT
HANDPIECE INTERPULSE COAX TIP (DISPOSABLE) ×1
HOOD PEEL AWAY FLYTE STAYCOOL (MISCELLANEOUS) ×10 IMPLANT
INSERT TIB 4 11XBRNG CRCTE RTN (Insert) ×1 IMPLANT
KNEE INSERT TIB BRG (Insert) ×1 IMPLANT
KNEE PATELLA ASYMMETRIC 10X35 (Knees) ×2 IMPLANT
KNEE TIBIAL COMP TRI SZ4 (Knees) ×2 IMPLANT
MARKER SKIN DUAL TIP RULER LAB (MISCELLANEOUS) ×2 IMPLANT
NDL SAFETY ECLIPSE 18X1.5 (NEEDLE) ×1 IMPLANT
NEEDLE HYPO 18GX1.5 SHARP (NEEDLE) ×1
NEEDLE SPNL 18GX3.5 QUINCKE PK (NEEDLE) ×2 IMPLANT
NS IRRIG 1000ML POUR BTL (IV SOLUTION) ×2 IMPLANT
PACK TOTAL KNEE CUSTOM (KITS) ×2 IMPLANT
PADDING CAST COTTON 6X4 STRL (CAST SUPPLIES) ×2 IMPLANT
POSITIONER SURGICAL ARM (MISCELLANEOUS) ×2 IMPLANT
SAW OSC TIP CART 19.5X105X1.3 (SAW) ×2 IMPLANT
SEALER BIPOLAR AQUA 6.0 (INSTRUMENTS) ×2 IMPLANT
SET HNDPC FAN SPRY TIP SCT (DISPOSABLE) ×1 IMPLANT
SET PAD KNEE POSITIONER (MISCELLANEOUS) ×2 IMPLANT
SPONGE DRAIN TRACH 4X4 STRL 2S (GAUZE/BANDAGES/DRESSINGS) IMPLANT
SPONGE LAP 18X18 X RAY DECT (DISPOSABLE) IMPLANT
STAPLER VISISTAT 35W (STAPLE) ×2 IMPLANT
SUT MNCRL AB 3-0 PS2 18 (SUTURE) ×2 IMPLANT
SUT MON AB 2-0 CT1 36 (SUTURE) ×4 IMPLANT
SUT STRATAFIX PDO 1 14 VIOLET (SUTURE) ×1
SUT STRATFX PDO 1 14 VIOLET (SUTURE) ×1
SUT VIC AB 1 CT1 36 (SUTURE) ×6 IMPLANT
SUT VIC AB 2-0 CT1 27 (SUTURE) ×1
SUT VIC AB 2-0 CT1 TAPERPNT 27 (SUTURE) ×1 IMPLANT
SUTURE STRATFX PDO 1 14 VIOLET (SUTURE) ×1 IMPLANT
SYR 3ML LL SCALE MARK (SYRINGE) ×2 IMPLANT
SYR 50ML LL SCALE MARK (SYRINGE) ×2 IMPLANT
TOWER CARTRIDGE SMART MIX (DISPOSABLE) IMPLANT
TRAY FOLEY CATH 14FRSI W/METER (CATHETERS) ×2 IMPLANT
TRAY FOLEY W/METER SILVER 16FR (SET/KITS/TRAYS/PACK) IMPLANT
WATER STERILE IRR 1000ML POUR (IV SOLUTION) ×4 IMPLANT
WRAP KNEE MAXI GEL POST OP (GAUZE/BANDAGES/DRESSINGS) ×2 IMPLANT
YANKAUER SUCT BULB TIP 10FT TU (MISCELLANEOUS) ×2 IMPLANT

## 2017-06-10 NOTE — Anesthesia Preprocedure Evaluation (Signed)
Anesthesia Evaluation  Patient identified by MRN, date of birth, ID band Patient awake    Reviewed: Allergy & Precautions, NPO status , Patient's Chart, lab work & pertinent test results  Airway Mallampati: II  TM Distance: >3 FB     Dental   Pulmonary former smoker,    breath sounds clear to auscultation       Cardiovascular hypertension,  Rhythm:Regular Rate:Normal     Neuro/Psych    GI/Hepatic Neg liver ROS, GERD  ,  Endo/Other  negative endocrine ROS  Renal/GU negative Renal ROS     Musculoskeletal   Abdominal   Peds  Hematology   Anesthesia Other Findings   Reproductive/Obstetrics                             Anesthesia Physical Anesthesia Plan  ASA: III  Anesthesia Plan: Spinal   Post-op Pain Management:  Regional for Post-op pain   Induction: Intravenous  PONV Risk Score and Plan: Treatment may vary due to age or medical condition  Airway Management Planned: Simple Face Mask and Nasal Cannula  Additional Equipment:   Intra-op Plan:   Post-operative Plan:   Informed Consent: I have reviewed the patients History and Physical, chart, labs and discussed the procedure including the risks, benefits and alternatives for the proposed anesthesia with the patient or authorized representative who has indicated his/her understanding and acceptance.   Dental advisory given  Plan Discussed with: Anesthesiologist, CRNA and Surgeon  Anesthesia Plan Comments:         Anesthesia Quick Evaluation

## 2017-06-10 NOTE — Anesthesia Procedure Notes (Signed)
Anesthesia Regional Block: Adductor canal block   Pre-Anesthetic Checklist: ,, timeout performed,, Correct Site,, Correct Procedure,, site marked,,, surgical consent,,  Laterality: Left  Prep: chloraprep        Procedures: Doppler guided,,,, ultrasound used (permanent image in chart),,,,  Narrative:  Start time: 06/10/2017 7:20 AM End time: 06/10/2017 7:35 AM Injection made incrementally with aspirations every 5 mL.  Performed by: Personally  Anesthesiologist: Belinda Block, MD  Additional Notes: f

## 2017-06-10 NOTE — Anesthesia Procedure Notes (Addendum)
Spinal  Start time: 06/10/2017 7:45 AM End time: 06/10/2017 8:00 AM Staffing Anesthesiologist: Belinda Block, MD Performed: anesthesiologist  Preanesthetic Checklist Completed: patient identified, site marked, surgical consent, pre-op evaluation, timeout performed, IV checked, risks and benefits discussed and monitors and equipment checked Spinal Block Patient position: sitting Prep: ChloraPrep Patient monitoring: heart rate, continuous pulse ox and blood pressure Approach: midline Location: L3-4 Injection technique: single-shot Needle Needle type: Quincke  Needle gauge: 22 G Needle length: 9 cm Assessment Sensory level: T6 Additional Notes Expiration date of kit checked and confirmed. Patient tolerated procedure well, without complications. (Attempts by CRNA RTE unsuccessful. Patient without complaint CG)

## 2017-06-10 NOTE — Evaluation (Signed)
Physical Therapy Evaluation Patient Details Name: Dominique Griffith MRN: [id number] DOB: February 12, 1941 Today's Date: 06/10/2017   History of Present Illness  Pt is a 77 year old female s/p L TKA with hx of R TKA 04/2017  Clinical Impression  Pt is s/p TKA resulting in the deficits listed below (see PT Problem List).  Pt will benefit from skilled PT to increase their independence and safety with mobility to allow discharge to the venue listed below.  Pt assisted with ambulating in hallway POD #0 and plans to d/c home with spouse.  Pt reports she would like f/u PT however uncertain MD orders at this time.  Pt also reports pain in her R knee (hx of recent R TKA) has been worse lately and limiting her standing ability.     Follow Up Recommendations Follow surgeon's recommendation for DC plan and follow-up therapies;Home health PT    Equipment Recommendations  None recommended by PT    Recommendations for Other Services       Precautions / Restrictions Precautions Precautions: Knee;Fall Restrictions Other Position/Activity Restrictions: WBAT      Mobility  Bed Mobility Overal bed mobility: Needs Assistance Bed Mobility: Supine to Sit     Supine to sit: Min guard;HOB elevated     General bed mobility comments: min/guard for safety, cues for technique  Transfers Overall transfer level: Needs assistance Equipment used: Rolling walker (2 wheeled) Transfers: Sit to/from Stand Sit to Stand: Min assist         General transfer comment: verbal cues for UE and LE positioning  Ambulation/Gait Ambulation/Gait assistance: Min guard Ambulation Distance (Feet): 80 Feet Assistive device: Rolling walker (2 wheeled) Gait Pattern/deviations: Step-through pattern;Decreased stride length     General Gait Details: verbal cues for sequence, pt reports R knee hurting more then L knee initially however subsided with distance, pt requested distance and warned of increased possibility of soreness  tomorrrow  Stairs            Wheelchair Mobility    Modified Rankin (Stroke Patients Only)       Balance                                             Pertinent Vitals/Pain Pain Assessment: 0-10 Pain Score: 5  Pain Location: R knee, reports little pain in L operative knee Pain Descriptors / Indicators: Sore Pain Intervention(s): Limited activity within patient's tolerance;Monitored during session;Repositioned;Ice applied    Home Living Family/patient expects to be discharged to:: Private residence Living Arrangements: Spouse/significant other Available Help at Discharge: Family Type of Home: House Home Access: Stairs to enter Entrance Stairs-Rails: Left Entrance Stairs-Number of Steps: 4 Home Layout: One level Home Equipment: Environmental consultant - 2 wheels      Prior Function Level of Independence: Independent               Hand Dominance        Extremity/Trunk Assessment        Lower Extremity Assessment Lower Extremity Assessment: LLE deficits/detail LLE Deficits / Details: able to perform SLR, observed 70* active L knee flexion sitting EOB       Communication   Communication: No difficulties  Cognition Arousal/Alertness: Awake/alert Behavior During Therapy: WFL for tasks assessed/performed Overall Cognitive Status: Within Functional Limits for tasks assessed  General Comments: questionable memory issues, pain meds?      General Comments      Exercises     Assessment/Plan    PT Assessment Patient needs continued PT services  PT Problem List Decreased strength;Decreased range of motion;Decreased activity tolerance;Decreased balance;Decreased mobility;Decreased knowledge of use of DME;Pain       PT Treatment Interventions DME instruction;Gait training;Stair training;Functional mobility training;Therapeutic activities;Therapeutic exercise;Patient/family education    PT Goals  (Current goals can be found in the Care Plan section)  Acute Rehab PT Goals PT Goal Formulation: With patient Time For Goal Achievement: 06/17/17 Potential to Achieve Goals: Good    Frequency 7X/week   Barriers to discharge        Co-evaluation               AM-PAC PT "6 Clicks" Daily Activity  Outcome Measure Difficulty turning over in bed (including adjusting bedclothes, sheets and blankets)?: A Lot Difficulty moving from lying on back to sitting on the side of the bed? : A Lot Difficulty sitting down on and standing up from a chair with arms (e.g., wheelchair, bedside commode, etc,.)?: Unable Help needed moving to and from a bed to chair (including a wheelchair)?: A Little Help needed walking in hospital room?: A Little Help needed climbing 3-5 steps with a railing? : A Lot 6 Click Score: 13    End of Session Equipment Utilized During Treatment: Gait belt Activity Tolerance: Patient tolerated treatment well Patient left: with call bell/phone within reach;with family/visitor present;in chair;with chair alarm set Nurse Communication: Mobility status PT Visit Diagnosis: Difficulty in walking, not elsewhere classified (R26.2)    Time: 3810-1751 PT Time Calculation (min) (ACUTE ONLY): 22 min   Charges:   PT Evaluation $PT Eval Low Complexity: 1 Low     PT G Codes:       Carmelia Bake, PT, DPT 06/10/2017 Pager: [phone]  York Ram E 06/10/2017, 4:39 PM

## 2017-06-10 NOTE — Op Note (Signed)
OPERATIVE REPORT  SURGEON: Rod Can, MD   ASSISTANT: Nehemiah Massed, PA-C.  PREOPERATIVE DIAGNOSIS: Left knee arthritis.   POSTOPERATIVE DIAGNOSIS: Left knee arthritis.   PROCEDURE: Left total knee arthroplasty.   IMPLANTS: Stryker Triathlon CR femur, size 4. Stryker Tritanium tibia, size 4. X3 polyethelyene insert, size 11 mm, CR. 3 button asymmetric patella, size 35 mm.  ANESTHESIA:  Regional and Spinal  TOURNIQUET TIME: Not utilized.   ESTIMATED BLOOD LOSS:-250 mL    ANTIBIOTICS: 900 mg clindamycin.  DRAINS: None.  COMPLICATIONS: None   CONDITION: PACU - hemodynamically stable.   BRIEF CLINICAL NOTE: Dominique Griffith is a 77 y.o. female with a long-standing history of Left knee arthritis. After failing conservative management, the patient was indicated for total knee arthroplasty. The risks, benefits, and alternatives to the procedure were explained, and the patient elected to proceed.  PROCEDURE IN DETAIL: Adductor canal block was obtained in the pre-op holding area. Once inside the operative room, spinal anesthesia was obtained, and a foley catheter was inserted. The patient was then positioned, a nonsterile tourniquet was placed, and the lower extremity was prepped and draped in the normal sterile surgical fashion. A time-out was called verifying side and site of surgery. The patient received IV antibiotics within 60 minutes of beginning the procedure. The tourniquet was not utilized.  An anterior approach to the knee was performed utilizing a midvastus arthrotomy. A medial release was performed and the patellar fat pad was excised. Stryker navigation was used to cut the distal femur perpendicular to the mechanical axis. A freehand patellar resection was performed, and the patella was sized an prepared with 3 lug holes.  Nagivation was used to make a neutral proximal  tibia resection, taking 10 mm of bone from the less affected lateral side with 3 degrees of slope. The menisci were excised. A spacer block was placed, and the alignment and balance in extension were confirmed.   The distal femur was sized using the 3-degree external rotation guide referencing the posterior femoral cortex. The appropriate 4-in-1 cutting block was pinned into place. Rotation was checked using Whiteside's line, the epicondylar axis, and then confirmed with a spacer block in flexion. The remaining femoral cuts were performed, taking care to protect the MCL.  The tibia was sized and the trial tray was pinned into place. The remaining trail components were inserted. The knee was stable to varus and valgus stress through a full range of motion. The patella tracked centrally, and the PCL was well balanced. The trial components were removed, and the proximal tibial surface was prepared. Final components were impacted into place. The knee was tested for a final time and found to be well balanced.  The wound was copiously irrigated with normal saline with pulse lavage. Marcaine solution was injected into the periarticular soft tissue. The wound was closed in layers using #1 Vicryl and Stratafix for the fascia, 2-0 Vicryl for the subcutaneous fat, 2-0 Monocryl for the deep dermal layer, staples for the skin, and Dermabond for the skin. Once the glue was fully dried, an Aquacell Ag and compressive dressing were applied. Tthe patient was transported to the recovery room in stable condition. Sponge, needle, and instrument counts were correct at the end of the case x2. The patient tolerated the procedure well and there were no known complications.  Please note that a surgical assistant was a medical necessity for this procedure in order to perform it in a safe and expeditious manner. Surgical assistant was necessary to  retract the ligaments and vital neurovascular structures to prevent injury to  them and also necessary for proper positioning of the limb to allow for anatomic placement of the prosthesis.

## 2017-06-10 NOTE — Anesthesia Postprocedure Evaluation (Signed)
Anesthesia Post Note  Patient: Dominique Griffith  Procedure(s) Performed: LEFT TOTAL KNEE ARTHROPLASTY WITH COMPUTER NAVIGATION (Left Knee)     Patient location during evaluation: PACU Anesthesia Type: Spinal Level of consciousness: awake Pain management: pain level controlled Vital Signs Assessment: post-procedure vital signs reviewed and stable Respiratory status: spontaneous breathing Cardiovascular status: stable Postop Assessment: no apparent nausea or vomiting Anesthetic complications: no    Last Vitals:  Vitals:   06/10/17 1104 06/10/17 1205  BP: 123/66 119/74  Pulse: 79 79  Resp: 15 14  Temp: (!) 36.4 C 36.6 C  SpO2: 100% 100%    Last Pain:  Vitals:   06/10/17 1205  TempSrc: Oral  PainSc:                  Jessi Pitstick

## 2017-06-10 NOTE — Anesthesia Postprocedure Evaluation (Signed)
Anesthesia Post Note  Patient: Dominique Griffith  Procedure(s) Performed: LEFT TOTAL KNEE ARTHROPLASTY WITH COMPUTER NAVIGATION (Left Knee)     Patient location during evaluation: PACU Anesthesia Type: Spinal Level of consciousness: awake Pain management: pain level controlled Vital Signs Assessment: post-procedure vital signs reviewed and stable Respiratory status: spontaneous breathing Cardiovascular status: stable Anesthetic complications: no    Last Vitals:  Vitals:   06/10/17 1104 06/10/17 1205  BP: 123/66 119/74  Pulse: 79 79  Resp: 15 14  Temp: (!) 36.4 C 36.6 C  SpO2: 100% 100%    Last Pain:  Vitals:   06/10/17 1205  TempSrc: Oral  PainSc:                  Emberley Kral

## 2017-06-10 NOTE — Interval H&P Note (Signed)
History and Physical Interval Note:  06/10/2017 7:28 AM  Dominique Griffith  has presented today for surgery, with the diagnosis of Degenerative joint disease left knee  The various methods of treatment have been discussed with the patient and family. After consideration of risks, benefits and other options for treatment, the patient has consented to  Procedure(s) with comments: LEFT TOTAL KNEE ARTHROPLASTY WITH COMPUTER NAVIGATION (Left) - Needs RNFA as a surgical intervention .  The patient's history has been reviewed, patient examined, no change in status, stable for surgery.  I have reviewed the patient's chart and labs.  Questions were answered to the patient's satisfaction.     Hilton Cork Ilayda Toda

## 2017-06-10 NOTE — Discharge Instructions (Signed)
° °Dr. Stefana Lodico °Total Joint Specialist °[facility] °[address] °[place], [place] [address] °[phone] ° °TOTAL KNEE REPLACEMENT POSTOPERATIVE DIRECTIONS ° ° ° °Knee Rehabilitation, Guidelines Following Surgery  °Results after knee surgery are often greatly improved when you follow the exercise, range of motion and muscle strengthening exercises prescribed by your doctor. Safety measures are also important to protect the knee from further injury. Any time any of these exercises cause you to have increased pain or swelling in your knee joint, decrease the amount until you are comfortable again and slowly increase them. If you have problems or questions, call your caregiver or physical therapist for advice.  ° °WEIGHT BEARING °Weight bearing as tolerated with assist device (walker, cane, etc) as directed, use it as long as suggested by your surgeon or therapist, typically at least 4-6 weeks. ° °HOME CARE INSTRUCTIONS  °Remove items at home which could result in a fall. This includes throw rugs or furniture in walking pathways.  °Continue medications as instructed at time of discharge. °You may have some home medications which will be placed on hold until you complete the course of blood thinner medication.  °You may start showering once you are discharged home but do not submerge the incision under water. Just pat the incision dry and apply a dry gauze dressing on daily. °Walk with walker as instructed.  °You may resume a sexual relationship in one month or when given the OK by your doctor.  °· Use walker as long as suggested by your caregivers. °· Avoid periods of inactivity such as sitting longer than an hour when not asleep. This helps prevent blood clots.  °You may put full weight on your legs and walk as much as is comfortable.  °You may return to work once you are cleared by your doctor.  °Do not drive a car for 6 weeks or until released by you surgeon.  °· Do not drive  while taking narcotics.  °Wear the elastic stockings for three weeks following surgery during the day but you may remove then at night. °Make sure you keep all of your appointments after your operation with all of your doctors and caregivers. You should call the office at the above phone number and make an appointment for approximately two weeks after the date of your surgery. °Do not remove your surgical dressing. The dressing is waterproof; you may take showers in 3 days, but do not take tub baths or submerge the dressing. °Please pick up a stool softener and laxative for home use as long as you are requiring pain medications. °· ICE to the affected knee every three hours for 30 minutes at a time and then as needed for pain and swelling.  Continue to use ice on the knee for pain and swelling from surgery. You may notice swelling that will progress down to the foot and ankle.  This is normal after surgery.  Elevate the leg when you are not up walking on it.   °It is important for you to complete the blood thinner medication as prescribed by your doctor. °· Continue to use the breathing machine which will help keep your temperature down.  It is common for your temperature to cycle up and down following surgery, especially at night when you are not up moving around and exerting yourself.  The breathing machine keeps your lungs expanded and your temperature down. ° °RANGE OF MOTION AND STRENGTHENING EXERCISES  °Rehabilitation of the knee is important following   a knee injury or an operation. After just a few days of immobilization, the muscles of the thigh which control the knee become weakened and shrink (atrophy). Knee exercises are designed to build up the tone and strength of the thigh muscles and to improve knee motion. Often times heat used for twenty to thirty minutes before working out will loosen up your tissues and help with improving the range of motion but do not use heat for the first two weeks following  surgery. These exercises can be done on a training (exercise) mat, on the floor, on a table or on a bed. Use what ever works the best and is most comfortable for you Knee exercises include:  °Leg Lifts - While your knee is still immobilized in a splint or cast, you can do straight leg raises. Lift the leg to 60 degrees, hold for 3 sec, and slowly lower the leg. Repeat 10-20 times 2-3 times daily. Perform this exercise against resistance later as your knee gets better.  °Quad and Hamstring Sets - Tighten up the muscle on the front of the thigh (Quad) and hold for 5-10 sec. Repeat this 10-20 times hourly. Hamstring sets are done by pushing the foot backward against an object and holding for 5-10 sec. Repeat as with quad sets.  °A rehabilitation program following serious knee injuries can speed recovery and prevent re-injury in the future due to weakened muscles. Contact your doctor or a physical therapist for more information on knee rehabilitation.  ° °SKILLED REHAB INSTRUCTIONS: °If the patient is transferred to a skilled rehab facility following release from the hospital, a list of the current medications will be sent to the facility for the patient to continue.  When discharged from the skilled rehab facility, please have the facility set up the patient's Home Health Physical Therapy prior to being released. Also, the skilled facility will be responsible for providing the patient with their medications at time of release from the facility to include their pain medication, the muscle relaxants, and their blood thinner medication. If the patient is still at the rehab facility at time of the two week follow up appointment, the skilled rehab facility will also need to assist the patient in arranging follow up appointment in our office and any transportation needs. ° °MAKE SURE YOU:  °Understand these instructions.  °Will watch your condition.  °Will get help right away if you are not doing well or get worse.   ° ° °Pick up stool softner and laxative for home use following surgery while on pain medications. °Do NOT remove your dressing. You may shower.  °Do not take tub baths or submerge incision under water. °May shower starting three days after surgery. °Please use a clean towel to pat the incision dry following showers. °Continue to use ice for pain and swelling after surgery. °Do not use any lotions or creams on the incision until instructed by your surgeon. ° °

## 2017-06-10 NOTE — Transfer of Care (Signed)
Immediate Anesthesia Transfer of Care Note  Patient: Dominique Griffith  Procedure(s) Performed: LEFT TOTAL KNEE ARTHROPLASTY WITH COMPUTER NAVIGATION (Left Knee)  Patient Location: PACU  Anesthesia Type:Spinal  Level of Consciousness: awake, alert , oriented and patient cooperative  Airway & Oxygen Therapy: Patient Spontanous Breathing and Patient connected to face mask  Post-op Assessment: Report given to RN, Post -op Vital signs reviewed and stable and Patient moving all extremities  Post vital signs: Reviewed and stable  Last Vitals:  Vitals Value Taken Time  BP    Temp    Pulse 81 06/10/2017 10:17 AM  Resp 23 06/10/2017 10:17 AM  SpO2 100 % 06/10/2017 10:17 AM  Vitals shown include unvalidated device data.  Last Pain:  Vitals:   06/10/17 0549  TempSrc: Oral      Patients Stated Pain Goal: 4 (66/06/00 4599)  Complications: No apparent anesthesia complications

## 2017-06-11 ENCOUNTER — Encounter (HOSPITAL_COMMUNITY): Payer: Self-pay | Admitting: Orthopedic Surgery

## 2017-06-11 LAB — CBC
HCT: 28.3 % — ABNORMAL LOW (ref 36.0–46.0)
Hemoglobin: 9.5 g/dL — ABNORMAL LOW (ref 12.0–15.0)
MCH: 32 pg (ref 26.0–34.0)
MCHC: 33.6 g/dL (ref 30.0–36.0)
MCV: 95.3 fL (ref 78.0–100.0)
PLATELETS: 263 10*3/uL (ref 150–400)
RBC: 2.97 MIL/uL — ABNORMAL LOW (ref 3.87–5.11)
RDW: 13.2 % (ref 11.5–15.5)
WBC: 12.3 10*3/uL — AB (ref 4.0–10.5)

## 2017-06-11 LAB — BASIC METABOLIC PANEL
ANION GAP: 6 (ref 5–15)
BUN: 17 mg/dL (ref 6–20)
CALCIUM: 8.3 mg/dL — AB (ref 8.9–10.3)
CO2: 25 mmol/L (ref 22–32)
CREATININE: 0.78 mg/dL (ref 0.44–1.00)
Chloride: 104 mmol/L (ref 101–111)
Glucose, Bld: 117 mg/dL — ABNORMAL HIGH (ref 65–99)
Potassium: 4.2 mmol/L (ref 3.5–5.1)
SODIUM: 135 mmol/L (ref 135–145)

## 2017-06-11 MED ORDER — HYDROCODONE-ACETAMINOPHEN 7.5-325 MG PO TABS
1.0000 | ORAL_TABLET | ORAL | Status: DC | PRN
  Administered 2017-06-11 – 2017-06-12 (×5): 2 via ORAL
  Filled 2017-06-11 (×5): qty 2

## 2017-06-11 MED ORDER — ONDANSETRON HCL 4 MG PO TABS: 4.0000 mg | ORAL_TABLET | Freq: Four times a day (QID) | ORAL | 0 refills | Status: DC | PRN

## 2017-06-11 MED ORDER — ASPIRIN 81 MG PO CHEW: 81.0000 mg | CHEWABLE_TABLET | Freq: Two times a day (BID) | ORAL | 1 refills | Status: DC

## 2017-06-11 MED ORDER — HYDROCODONE-ACETAMINOPHEN 7.5-325 MG PO TABS: 1.0000 | ORAL_TABLET | Freq: Four times a day (QID) | ORAL | 0 refills | Status: DC | PRN

## 2017-06-11 NOTE — Progress Notes (Signed)
    Subjective:  Patient reports pain as mild to moderate.  Denies N/V/CP/SOB. C/o L knee pain.  Objective:   VITALS:   Vitals:   06/10/17 2124 06/11/17 0149 06/11/17 0512 06/11/17 0900  BP: 110/61 99/80 (!) 130/59 116/64  Pulse: 78 76 72 78  Resp: 17 14 16 18   Temp: 97.8 F (36.6 C) 98.3 F (36.8 C) 97.7 F (36.5 C) (!) 97.5 F (36.4 C)  TempSrc: Oral Oral Oral Oral  SpO2: 100% 98% 100% 96%  Weight:      Height:        NAD ABD soft Sensation intact distally Intact pulses distally Dorsiflexion/Plantar flexion intact Incision: dressing C/D/I Compartment soft Able to SLR   Lab Results  Component Value Date   WBC 12.3 (H) 06/11/2017   HGB 9.5 (L) 06/11/2017   HCT 28.3 (L) 06/11/2017   MCV 95.3 06/11/2017   PLT 263 06/11/2017   BMET    Component Value Date/Time   NA 135 06/11/2017 0521   K 4.2 06/11/2017 0521   CL 104 06/11/2017 0521   CO2 25 06/11/2017 0521   GLUCOSE 117 (H) 06/11/2017 0521   BUN 17 06/11/2017 0521   CREATININE 0.78 06/11/2017 0521   CALCIUM 8.3 (L) 06/11/2017 0521   GFRNONAA >60 06/11/2017 0521   GFRAA >60 06/11/2017 0521     Assessment/Plan: 1 Day Post-Op   Principal Problem:   Osteoarthritis of left knee   WBAT with walker DVT ppx: ASA, SCDs, TEDS PO pain control PT/OT Dispo: D/C home tomorrow with outpatient PT    Dominique Griffith 06/11/2017, 10:39 AM   Rod Can, MD Cell [phone]

## 2017-06-11 NOTE — Progress Notes (Signed)
Physical Therapy Treatment Patient Details Name: Dominique Griffith MRN: [id number] DOB: 08-05-1940 Today's Date: 06/11/2017    History of Present Illness Pt is a 77 year old female s/p L TKA with hx of R TKA 04/2017    PT Comments    Pt reports pain is not better but she is willing to work within limits; Pt is planning to d/c Saturday, will see in am  Follow Up Recommendations  Follow surgeon's recommendation for DC plan and follow-up therapies;Outpatient PT     Equipment Recommendations  None recommended by PT    Recommendations for Other Services       Precautions / Restrictions Precautions Precautions: Knee;Fall Restrictions Weight Bearing Restrictions: No Other Position/Activity Restrictions: WBAT    Mobility  Bed Mobility Overal bed mobility: Needs Assistance Bed Mobility: Supine to Sit;Sit to Supine     Supine to sit: Supervision Sit to supine: Supervision   General bed mobility comments: for safety  Transfers Overall transfer level: Needs assistance Equipment used: Rolling walker (2 wheeled) Transfers: Sit to/from Stand Sit to Stand: Min guard         General transfer comment: verbal cues for UE and LE positioning  Ambulation/Gait Ambulation/Gait assistance: Min guard Ambulation Distance (Feet): 140 Feet Assistive device: Rolling walker (2 wheeled) Gait Pattern/deviations: Step-through pattern;Decreased stride length     General Gait Details: verbal cues for sequence,  use of UEs for pain control L knee   Stairs             Wheelchair Mobility    Modified Rankin (Stroke Patients Only)       Balance                                            Cognition Arousal/Alertness: Awake/alert Behavior During Therapy: WFL for tasks assessed/performed Overall Cognitive Status: Within Functional Limits for tasks assessed                                        Exercises      General Comments         Pertinent Vitals/Pain Pain Assessment: 0-10 Pain Score: 6  Pain Location: R knee, reports little pain in L operative knee Pain Descriptors / Indicators: Sore Pain Intervention(s): Monitored during session;Limited activity within patient's tolerance;Ice applied    Home Living                      Prior Function            PT Goals (current goals can now be found in the care plan section) Acute Rehab PT Goals Patient Stated Goal: home PT Goal Formulation: With patient Time For Goal Achievement: 06/17/17 Potential to Achieve Goals: Good    Frequency    7X/week      PT Plan Current plan remains appropriate    Co-evaluation              AM-PAC PT "6 Clicks" Daily Activity  Outcome Measure  Difficulty turning over in bed (including adjusting bedclothes, sheets and blankets)?: A Little Difficulty moving from lying on back to sitting on the side of the bed? : A Little Difficulty sitting down on and standing up from a chair with arms (e.g., wheelchair, bedside commode, etc,.)?: A  Little Help needed moving to and from a bed to chair (including a wheelchair)?: A Little Help needed walking in hospital room?: A Little Help needed climbing 3-5 steps with a railing? : A Lot 6 Click Score: 17    End of Session Equipment Utilized During Treatment: Gait belt Activity Tolerance: Patient tolerated treatment well Patient left: with call bell/phone within reach;in bed;with bed alarm set;with family/visitor present Nurse Communication: Mobility status PT Visit Diagnosis: Difficulty in walking, not elsewhere classified (R26.2)     Time: 5465-6812 PT Time Calculation (min) (ACUTE ONLY): 17 min  Charges:  $Gait Training: 8-22 mins                    G CodesKenyon Ana, PT Pager: [phone] 06/11/2017    [facility] 06/11/2017, 3:33 PM

## 2017-06-11 NOTE — Discharge Summary (Signed)
Physician Discharge Summary  Patient ID: Dominique Griffith MRN: [id number] DOB/AGE: May 20, 1940 77 y.o.  Admit date: 06/10/2017 Discharge date: 06/11/2017  Admission Diagnoses:  Osteoarthritis of left knee  Discharge Diagnoses:  Principal Problem:   Osteoarthritis of left knee   Past Medical History:  Diagnosis Date  . Anxiety   . Complication of anesthesia    BACK SURGERY IN 2010; WAS PUT UNDER GENERAL ANESTHESIA ; REPORTS MEMORY LOSS AND CHANGE IN BEHAVIOR FOR 2 MONTHS POST-OP ; STILL OCCASIONALLY HAS TIMES SHE FORGETS THINGS   . Depression   . GERD (gastroesophageal reflux disease)   . Hypertension   . Syncope and collapse    6 MONTHS AGO HAD SYNCOPE AND COLLAPSE ; SAW CARDIOLOGIST;  HAD UNREMARKABLE ECHO ;     Surgeries: Procedure(s): LEFT TOTAL KNEE ARTHROPLASTY WITH COMPUTER NAVIGATION on 06/10/2017   Consultants (if any):   Discharged Condition: Improved  Hospital Course: Dominique Griffith is an 77 y.o. female who was admitted 06/10/2017 with a diagnosis of Osteoarthritis of left knee and went to the operating room on 06/10/2017 and underwent the above named procedures.    She was given perioperative antibiotics:  Anti-infectives (From admission, onward)   Start     Dose/Rate Route Frequency Ordered Stop   06/10/17 1400  clindamycin (CLEOCIN) IVPB 600 mg     600 mg 100 mL/hr over 30 Minutes Intravenous Every 6 hours 06/10/17 1112 06/10/17 2039   06/10/17 0544  clindamycin (CLEOCIN) IVPB 900 mg     900 mg 100 mL/hr over 30 Minutes Intravenous On call to O.R. 06/10/17 6378 06/10/17 0802    .  She was given sequential compression devices, early ambulation, and ASA for DVT prophylaxis.  She benefited maximally from the hospital stay and there were no complications.    Recent vital signs:  Vitals:   06/11/17 0900 06/11/17 1353  BP: 116/64 140/68  Pulse: 78 87  Resp: 18 18  Temp: (!) 97.5 F (36.4 C) 97.7 F (36.5 C)  SpO2: 96% 95%    Recent laboratory studies:   Lab Results  Component Value Date   HGB 9.5 (L) 06/11/2017   HGB 12.0 06/04/2017   HGB 9.2 (L) 04/24/2017   Lab Results  Component Value Date   WBC 12.3 (H) 06/11/2017   PLT 263 06/11/2017   Lab Results  Component Value Date   INR 0.95 10/15/2016   Lab Results  Component Value Date   NA 135 06/11/2017   K 4.2 06/11/2017   CL 104 06/11/2017   CO2 25 06/11/2017   BUN 17 06/11/2017   CREATININE 0.78 06/11/2017   GLUCOSE 117 (H) 06/11/2017    Discharge Medications:   Allergies as of 06/11/2017      Reactions   Amoxicillin Rash   Has patient had a PCN reaction causing immediate rash, facial/tongue/throat swelling, SOB or lightheadedness with hypotension: No Has patient had a PCN reaction causing severe rash involving mucus membranes or skin necrosis: No Has patient had a PCN reaction that required hospitalization: No Has patient had a PCN reaction occurring within the last 10 years: No If all of the above answers are "NO", then may proceed with Cephalosporin use.   Penicillins Rash, Other (See Comments)   Has patient had a PCN reaction causing immediate rash, facial/tongue/throat swelling, SOB or lightheadedness with hypotension: No Has patient had a PCN reaction causing severe rash involving mucus membranes or skin necrosis: No Has patient had a PCN reaction that required hospitalization: No  Has patient had a PCN reaction occurring within the last 10 years: No If all of the above answers are "NO", then may proceed with Cephalosporin use.      Medication List    STOP taking these medications   COSAMIN DS PO   HYDROcodone-acetaminophen 5-325 MG tablet Commonly known as:  NORCO/VICODIN Replaced by:  HYDROcodone-acetaminophen 7.5-325 MG tablet   traMADol 50 MG tablet Commonly known as:  ULTRAM     TAKE these medications   aluminum chloride 20 % external solution Commonly known as:  DRYSOL Apply 1 application topically once a week.   amitriptyline 25 MG  tablet Commonly known as:  ELAVIL Take 12.5 mg by mouth at bedtime.   aspirin 81 MG chewable tablet Chew 1 tablet (81 mg total) by mouth 2 (two) times daily. What changed:  when to take this   baclofen 10 MG tablet Commonly known as:  LIORESAL Take 10 mg by mouth 2 (two) times daily with a meal.   buPROPion 150 MG 24 hr tablet Commonly known as:  WELLBUTRIN XL Take 150 mg by mouth 2 (two) times daily.   CALCIUM-VITAMIN D3 PO Take 1 tablet by mouth every other day.   cetirizine 10 MG tablet Commonly known as:  ZYRTEC Take 10 mg by mouth daily.   colchicine 0.6 MG tablet Take 0.6 mg by mouth daily.   docusate sodium 100 MG capsule Commonly known as:  COLACE Take 1 capsule (100 mg total) by mouth 2 (two) times daily. What changed:  when to take this   escitalopram 20 MG tablet Commonly known as:  LEXAPRO Take 20 mg by mouth daily.   famotidine 40 MG tablet Commonly known as:  PEPCID Take 40 mg by mouth 2 (two) times daily.   HYDROcodone-acetaminophen 7.5-325 MG tablet Commonly known as:  NORCO Take 1-2 tablets by mouth every 6 (six) hours as needed for severe pain. Replaces:  HYDROcodone-acetaminophen 5-325 MG tablet   losartan 100 MG tablet Commonly known as:  COZAAR Take 100 mg by mouth daily.   MULTIVITAMIN PO Take 1 tablet by mouth daily.   ondansetron 4 MG tablet Commonly known as:  ZOFRAN Take 1 tablet (4 mg total) by mouth every 6 (six) hours as needed for nausea. What changed:  Another medication with the same name was added. Make sure you understand how and when to take each.   ondansetron 4 MG tablet Commonly known as:  ZOFRAN Take 1 tablet (4 mg total) by mouth every 6 (six) hours as needed for nausea. What changed:  You were already taking a medication with the same name, and this prescription was added. Make sure you understand how and when to take each.   senna 8.6 MG Tabs tablet Commonly known as:  SENOKOT Take 2 tablets (17.2 mg total) by  mouth at bedtime.       Diagnostic Studies: Dg Knee Left Port  Result Date: 06/10/2017 CLINICAL DATA:  Status post left knee replacement EXAM: PORTABLE LEFT KNEE - 1-2 VIEW COMPARISON:  None. FINDINGS: Left knee prosthesis is noted in satisfactory position. No acute bony or soft tissue abnormality is noted. IMPRESSION: Status post left knee replacement Electronically Signed   By: Inez Catalina M.D.   On: 06/10/2017 11:07    Disposition:     Follow-up Information    Swinteck, Aaron Edelman, MD. Schedule an appointment as soon as possible for a visit in 2 weeks.   Specialty:  Orthopedic Surgery Why:  For wound re-check Contact  information: [address] [place] [address] [phone]            Signed: Hilton Cork Swinteck 06/11/2017, 3:45 PM

## 2017-06-11 NOTE — Progress Notes (Signed)
Discharge planning, no HH needs identified. Plan for OP PT, has DME. [phone] 

## 2017-06-11 NOTE — Plan of Care (Signed)
Pt resting waiting PT eval. RN will monitor pain.

## 2017-06-11 NOTE — Progress Notes (Signed)
Physical Therapy Treatment Patient Details Name: Dominique Griffith MRN: [id number] DOB: [birth year] Today's Date: 06/11/2017    History of Present Illness Pt is a 77 year old female s/p L TKA with hx of R TKA 04/2017    PT Comments    Pt progressing well;   Planning for d/c tomorrow  Follow Up Recommendations  Follow surgeon's recommendation for DC plan and follow-up therapies;Outpatient PT     Equipment Recommendations  None recommended by PT    Recommendations for Other Services       Precautions / Restrictions Precautions Precautions: Knee;Fall Restrictions Weight Bearing Restrictions: No Other Position/Activity Restrictions: WBAT    Mobility  Bed Mobility Overal bed mobility: Needs Assistance Bed Mobility: Supine to Sit;Sit to Supine     Supine to sit: Supervision Sit to supine: Supervision   General bed mobility comments: for safety  Transfers Overall transfer level: Needs assistance Equipment used: Rolling walker (2 wheeled) Transfers: Sit to/from Stand Sit to Stand: Min guard         General transfer comment: verbal cues for UE and LE positioning  Ambulation/Gait Ambulation/Gait assistance: Min guard Ambulation Distance (Feet): 100 Feet Assistive device: Rolling walker (2 wheeled) Gait Pattern/deviations: Step-through pattern;Decreased stride length     General Gait Details: verbal cues for sequence,  use of UEs for pain control L knee   Stairs             Wheelchair Mobility    Modified Rankin (Stroke Patients Only)       Balance                                            Cognition Arousal/Alertness: Awake/alert Behavior During Therapy: WFL for tasks assessed/performed Overall Cognitive Status: Within Functional Limits for tasks assessed                                        Exercises Total Joint Exercises Ankle Circles/Pumps: AROM;Both;15 reps;Supine Quad Sets: AROM;Both;10  reps;Supine Heel Slides: Supine;10 reps;AROM;Left Hip ABduction/ADduction: AROM;Left;10 reps Straight Leg Raises: AROM;Left;10 reps Goniometric ROM: grossly 5* to 80*    General Comments        Pertinent Vitals/Pain Pain Assessment: 0-10 Pain Score: 5  Pain Location: R knee, reports little pain in L operative knee Pain Descriptors / Indicators: Sore Pain Intervention(s): Monitored during session;Ice applied;Premedicated before session    Home Living                      Prior Function            PT Goals (current goals can now be found in the care plan section) Acute Rehab PT Goals Patient Stated Goal: home PT Goal Formulation: With patient Time For Goal Achievement: 06/17/17 Potential to Achieve Goals: Good Progress towards PT goals: Progressing toward goals    Frequency    7X/week      PT Plan Current plan remains appropriate    Co-evaluation              AM-PAC PT "6 Clicks" Daily Activity  Outcome Measure  Difficulty turning over in bed (including adjusting bedclothes, sheets and blankets)?: A Little Difficulty moving from lying on back to sitting on the side of the bed? : A Little  Difficulty sitting down on and standing up from a chair with arms (e.g., wheelchair, bedside commode, etc,.)?: A Little Help needed moving to and from a bed to chair (including a wheelchair)?: A Little Help needed walking in hospital room?: A Little Help needed climbing 3-5 steps with a railing? : A Lot 6 Click Score: 17    End of Session Equipment Utilized During Treatment: Gait belt Activity Tolerance: Patient tolerated treatment well Patient left: with call bell/phone within reach;in bed;with bed alarm set;with family/visitor present Nurse Communication: Mobility status PT Visit Diagnosis: Difficulty in walking, not elsewhere classified (R26.2)     Time: 3790-2409 PT Time Calculation (min) (ACUTE ONLY): 21 min  Charges:  $Gait Training: 8-22 mins                     G CodesKenyon Ana, PT Pager: [phone] 06/11/2017    Kenyon Ana 06/11/2017, 11:40 AM

## 2017-06-12 LAB — CBC
HEMATOCRIT: 28.5 % — AB (ref 36.0–46.0)
Hemoglobin: 9.2 g/dL — ABNORMAL LOW (ref 12.0–15.0)
MCH: 30.8 pg (ref 26.0–34.0)
MCHC: 32.3 g/dL (ref 30.0–36.0)
MCV: 95.3 fL (ref 78.0–100.0)
Platelets: 150 10*3/uL (ref 150–400)
RBC: 2.99 MIL/uL — ABNORMAL LOW (ref 3.87–5.11)
RDW: 13.4 % (ref 11.5–15.5)
WBC: 10.5 10*3/uL (ref 4.0–10.5)

## 2017-06-12 NOTE — Progress Notes (Signed)
Provided discharge instructions to patient.  Discussed home care, pain management, constipation prevention, and wound care.  Pt verbalized understanding.  No questions or concerns at this time.  Pt discharging to home will all belongings.

## 2017-06-12 NOTE — Progress Notes (Signed)
Physical Therapy Treatment Patient Details Name: Dominique Griffith MRN: [id number] DOB: Mar 24, 1940 Today's Date: 06/12/2017    History of Present Illness Pt is a 77 year old female s/p L TKA with hx of R TKA 04/2017    PT Comments    Pt making excellent progress, ready for D/C from PT standpoint  Follow Up Recommendations  Follow surgeon's recommendation for DC plan and follow-up therapies;Outpatient PT     Equipment Recommendations  None recommended by PT    Recommendations for Other Services       Precautions / Restrictions Precautions Precautions: Knee;Fall Restrictions Weight Bearing Restrictions: No Other Position/Activity Restrictions: WBAT    Mobility  Bed Mobility Overal bed mobility: Needs Assistance       Supine to sit: Supervision Sit to supine: Supervision   General bed mobility comments: for safety  Transfers Overall transfer level: Needs assistance Equipment used: Rolling walker (2 wheeled) Transfers: Sit to/from Stand Sit to Stand: Supervision         General transfer comment: verbal cues for UE and LE positioning--spouse is independent in cuing  Ambulation/Gait Ambulation/Gait assistance: Supervision;Modified independent (Device/Increase time) Ambulation Distance (Feet): 160 Feet Assistive device: Rolling walker (2 wheeled) Gait Pattern/deviations: Step-through pattern;Decreased stride length     General Gait Details: supervision for safety with turns   Stairs Stairs: Yes Stairs assistance: Min guard Stair Management: One rail Left;Forwards;With crutches;Step to pattern Number of Stairs: 5 General stair comments: cues for sequence and foot/crutch placement.  Spouse able to assist and cue as needed   Wheelchair Mobility    Modified Rankin (Stroke Patients Only)       Balance                                            Cognition Arousal/Alertness: Awake/alert Behavior During Therapy: WFL for tasks  assessed/performed Overall Cognitive Status: Within Functional Limits for tasks assessed                                        Exercises Total Joint Exercises Ankle Circles/Pumps: AROM;Both;15 reps;Supine Quad Sets: AROM;Both;10 reps;Supine Short Arc Quad: AROM;Left;10 reps Heel Slides: Supine;10 reps;AROM;Left Hip ABduction/ADduction: AROM;Left;10 reps Straight Leg Raises: AROM;Left;10 reps Long Arc Quad: AROM;Right;10 reps;Seated Goniometric ROM: 5* to 90* AAROM left knee flexion    General Comments        Pertinent Vitals/Pain Pain Assessment: 0-10 Pain Score: 5  Pain Location: R knee, reports little pain in L operative knee Pain Descriptors / Indicators: Sore Pain Intervention(s): Monitored during session    Home Living                      Prior Function            PT Goals (current goals can now be found in the care plan section) Acute Rehab PT Goals Patient Stated Goal: home PT Goal Formulation: With patient Time For Goal Achievement: 06/17/17 Potential to Achieve Goals: Good Progress towards PT goals: Progressing toward goals    Frequency    7X/week      PT Plan Current plan remains appropriate    Co-evaluation              AM-PAC PT "6 Clicks" Daily Activity  Outcome Measure  Difficulty  turning over in bed (including adjusting bedclothes, sheets and blankets)?: A Little Difficulty moving from lying on back to sitting on the side of the bed? : A Little Difficulty sitting down on and standing up from a chair with arms (e.g., wheelchair, bedside commode, etc,.)?: A Little Help needed moving to and from a bed to chair (including a wheelchair)?: A Little Help needed walking in hospital room?: A Little Help needed climbing 3-5 steps with a railing? : A Little 6 Click Score: 18    End of Session Equipment Utilized During Treatment: Gait belt Activity Tolerance: Patient tolerated treatment well Patient left: with call  bell/phone within reach;in chair;with chair alarm set;with family/visitor present Nurse Communication: Mobility status PT Visit Diagnosis: Difficulty in walking, not elsewhere classified (R26.2)     Time: 9833-8250 PT Time Calculation (min) (ACUTE ONLY): 29 min  Charges:  $Gait Training: 8-22 mins $Therapeutic Exercise: 8-22 mins                    G CodesKenyon Ana, PT Pager: [phone] 06/12/2017    Kenyon Ana 06/12/2017, 10:21 AM

## 2017-06-12 NOTE — Progress Notes (Signed)
Discharged from floor via w/c for transport home via car. Belongings & spouse with pt. No changes in assessment. Kaveh Kissinger, [employer]

## 2017-06-12 NOTE — Progress Notes (Signed)
    Subjective:  Patient reports pain as mild.  Denies N/V/CP/SOB. C/o L knee pain.  Objective:   VITALS:   Vitals:   06/11/17 0900 06/11/17 1353 06/11/17 2100 06/12/17 0444  BP: 116/64 140/68 130/62 120/61  Pulse: 78 87 84 78  Resp: 18 18 16 18   Temp: (!) 97.5 F (36.4 C) 97.7 F (36.5 C) 98.1 F (36.7 C) 98.4 F (36.9 C)  TempSrc: Oral Oral Oral Oral  SpO2: 96% 95% 93% 97%  Weight:      Height:        NAD ABD soft Sensation intact distally Intact pulses distally Dorsiflexion/Plantar flexion intact Incision: dressing C/D/I Compartment soft Able to SLR   Lab Results  Component Value Date   WBC 10.5 06/12/2017   HGB 9.2 (L) 06/12/2017   HCT 28.5 (L) 06/12/2017   MCV 95.3 06/12/2017   PLT 150 06/12/2017   BMET    Component Value Date/Time   NA 135 06/11/2017 0521   K 4.2 06/11/2017 0521   CL 104 06/11/2017 0521   CO2 25 06/11/2017 0521   GLUCOSE 117 (H) 06/11/2017 0521   BUN 17 06/11/2017 0521   CREATININE 0.78 06/11/2017 0521   CALCIUM 8.3 (L) 06/11/2017 0521   GFRNONAA >60 06/11/2017 0521   GFRAA >60 06/11/2017 0521     Assessment/Plan: 2 Days Post-Op   Principal Problem:   Osteoarthritis of left knee   WBAT with walker DVT ppx: ASA, SCDs, TEDS PO pain control PT/OT Dispo: D/C home today with outpt PT    Nicholes Stairs 06/12/2017, 9:43 AM

## 2017-06-14 DIAGNOSIS — M25562 Pain in left knee: Secondary | ICD-10-CM | POA: Diagnosis not present

## 2017-06-16 DIAGNOSIS — R300 Dysuria: Secondary | ICD-10-CM | POA: Diagnosis not present

## 2017-06-16 DIAGNOSIS — N3001 Acute cystitis with hematuria: Secondary | ICD-10-CM | POA: Diagnosis not present

## 2017-06-16 DIAGNOSIS — M25562 Pain in left knee: Secondary | ICD-10-CM | POA: Diagnosis not present

## 2017-06-21 DIAGNOSIS — M25562 Pain in left knee: Secondary | ICD-10-CM | POA: Diagnosis not present

## 2017-06-24 DIAGNOSIS — M25562 Pain in left knee: Secondary | ICD-10-CM | POA: Diagnosis not present

## 2017-06-25 DIAGNOSIS — Z471 Aftercare following joint replacement surgery: Secondary | ICD-10-CM | POA: Diagnosis not present

## 2017-06-25 DIAGNOSIS — Z96652 Presence of left artificial knee joint: Secondary | ICD-10-CM | POA: Diagnosis not present

## 2017-06-28 DIAGNOSIS — M25562 Pain in left knee: Secondary | ICD-10-CM | POA: Diagnosis not present

## 2017-06-29 ENCOUNTER — Ambulatory Visit: Payer: PPO | Admitting: Neurology

## 2017-06-29 ENCOUNTER — Encounter

## 2017-07-02 DIAGNOSIS — M25562 Pain in left knee: Secondary | ICD-10-CM | POA: Diagnosis not present

## 2017-07-05 DIAGNOSIS — M25562 Pain in left knee: Secondary | ICD-10-CM | POA: Diagnosis not present

## 2017-07-07 DIAGNOSIS — F3342 Major depressive disorder, recurrent, in full remission: Secondary | ICD-10-CM | POA: Diagnosis not present

## 2017-07-07 DIAGNOSIS — D500 Iron deficiency anemia secondary to blood loss (chronic): Secondary | ICD-10-CM | POA: Diagnosis not present

## 2017-07-07 DIAGNOSIS — I10 Essential (primary) hypertension: Secondary | ICD-10-CM | POA: Diagnosis not present

## 2017-07-07 DIAGNOSIS — N3001 Acute cystitis with hematuria: Secondary | ICD-10-CM | POA: Diagnosis not present

## 2017-07-07 DIAGNOSIS — M25562 Pain in left knee: Secondary | ICD-10-CM | POA: Diagnosis not present

## 2017-07-09 DIAGNOSIS — M25562 Pain in left knee: Secondary | ICD-10-CM | POA: Diagnosis not present

## 2017-07-12 DIAGNOSIS — M25562 Pain in left knee: Secondary | ICD-10-CM | POA: Diagnosis not present

## 2017-07-14 DIAGNOSIS — M25562 Pain in left knee: Secondary | ICD-10-CM | POA: Diagnosis not present

## 2017-07-16 DIAGNOSIS — M25562 Pain in left knee: Secondary | ICD-10-CM | POA: Diagnosis not present

## 2017-07-23 ENCOUNTER — Ambulatory Visit: Payer: PPO | Admitting: Neurology

## 2017-07-23 DIAGNOSIS — M25562 Pain in left knee: Secondary | ICD-10-CM | POA: Diagnosis not present

## 2017-07-26 DIAGNOSIS — M1712 Unilateral primary osteoarthritis, left knee: Secondary | ICD-10-CM | POA: Diagnosis not present

## 2017-07-27 DIAGNOSIS — Z96652 Presence of left artificial knee joint: Secondary | ICD-10-CM | POA: Diagnosis not present

## 2017-07-27 DIAGNOSIS — Z471 Aftercare following joint replacement surgery: Secondary | ICD-10-CM | POA: Diagnosis not present

## 2017-08-13 DIAGNOSIS — H25812 Combined forms of age-related cataract, left eye: Secondary | ICD-10-CM | POA: Diagnosis not present

## 2017-08-13 DIAGNOSIS — H43811 Vitreous degeneration, right eye: Secondary | ICD-10-CM | POA: Diagnosis not present

## 2017-08-13 DIAGNOSIS — H2511 Age-related nuclear cataract, right eye: Secondary | ICD-10-CM | POA: Diagnosis not present

## 2017-08-13 DIAGNOSIS — H31093 Other chorioretinal scars, bilateral: Secondary | ICD-10-CM | POA: Diagnosis not present

## 2017-09-16 DIAGNOSIS — H2512 Age-related nuclear cataract, left eye: Secondary | ICD-10-CM | POA: Diagnosis not present

## 2017-09-16 DIAGNOSIS — H268 Other specified cataract: Secondary | ICD-10-CM | POA: Diagnosis not present

## 2017-09-26 DIAGNOSIS — H2511 Age-related nuclear cataract, right eye: Secondary | ICD-10-CM | POA: Diagnosis not present

## 2017-09-30 DIAGNOSIS — H2511 Age-related nuclear cataract, right eye: Secondary | ICD-10-CM | POA: Diagnosis not present

## 2017-10-12 DIAGNOSIS — R350 Frequency of micturition: Secondary | ICD-10-CM | POA: Diagnosis not present

## 2017-12-15 DIAGNOSIS — F3342 Major depressive disorder, recurrent, in full remission: Secondary | ICD-10-CM | POA: Diagnosis not present

## 2017-12-15 DIAGNOSIS — M1711 Unilateral primary osteoarthritis, right knee: Secondary | ICD-10-CM | POA: Diagnosis not present

## 2017-12-15 DIAGNOSIS — M170 Bilateral primary osteoarthritis of knee: Secondary | ICD-10-CM | POA: Diagnosis not present

## 2017-12-15 DIAGNOSIS — D500 Iron deficiency anemia secondary to blood loss (chronic): Secondary | ICD-10-CM | POA: Diagnosis not present

## 2017-12-15 DIAGNOSIS — I10 Essential (primary) hypertension: Secondary | ICD-10-CM | POA: Diagnosis not present

## 2017-12-20 DIAGNOSIS — Z471 Aftercare following joint replacement surgery: Secondary | ICD-10-CM | POA: Diagnosis not present

## 2017-12-20 DIAGNOSIS — Z96652 Presence of left artificial knee joint: Secondary | ICD-10-CM | POA: Diagnosis not present

## 2017-12-27 DIAGNOSIS — M1711 Unilateral primary osteoarthritis, right knee: Secondary | ICD-10-CM | POA: Diagnosis not present

## 2017-12-27 DIAGNOSIS — M170 Bilateral primary osteoarthritis of knee: Secondary | ICD-10-CM | POA: Diagnosis not present

## 2017-12-27 DIAGNOSIS — I10 Essential (primary) hypertension: Secondary | ICD-10-CM | POA: Diagnosis not present

## 2017-12-27 DIAGNOSIS — F3342 Major depressive disorder, recurrent, in full remission: Secondary | ICD-10-CM | POA: Diagnosis not present

## 2017-12-27 DIAGNOSIS — D500 Iron deficiency anemia secondary to blood loss (chronic): Secondary | ICD-10-CM | POA: Diagnosis not present

## 2018-01-26 DIAGNOSIS — H02831 Dermatochalasis of right upper eyelid: Secondary | ICD-10-CM | POA: Diagnosis not present

## 2018-01-26 DIAGNOSIS — H02834 Dermatochalasis of left upper eyelid: Secondary | ICD-10-CM | POA: Diagnosis not present

## 2018-01-26 DIAGNOSIS — H57813 Brow ptosis, bilateral: Secondary | ICD-10-CM | POA: Diagnosis not present

## 2018-02-18 DIAGNOSIS — M1711 Unilateral primary osteoarthritis, right knee: Secondary | ICD-10-CM | POA: Diagnosis not present

## 2018-02-18 DIAGNOSIS — M170 Bilateral primary osteoarthritis of knee: Secondary | ICD-10-CM | POA: Diagnosis not present

## 2018-02-18 DIAGNOSIS — F3342 Major depressive disorder, recurrent, in full remission: Secondary | ICD-10-CM | POA: Diagnosis not present

## 2018-02-18 DIAGNOSIS — I10 Essential (primary) hypertension: Secondary | ICD-10-CM | POA: Diagnosis not present

## 2018-02-18 DIAGNOSIS — D500 Iron deficiency anemia secondary to blood loss (chronic): Secondary | ICD-10-CM | POA: Diagnosis not present

## 2018-03-21 DIAGNOSIS — Z0000 Encounter for general adult medical examination without abnormal findings: Secondary | ICD-10-CM | POA: Diagnosis not present

## 2018-03-21 DIAGNOSIS — I10 Essential (primary) hypertension: Secondary | ICD-10-CM | POA: Diagnosis not present

## 2018-03-21 DIAGNOSIS — D649 Anemia, unspecified: Secondary | ICD-10-CM | POA: Diagnosis not present

## 2018-03-21 DIAGNOSIS — Z86010 Personal history of colonic polyps: Secondary | ICD-10-CM | POA: Diagnosis not present

## 2018-03-21 DIAGNOSIS — K219 Gastro-esophageal reflux disease without esophagitis: Secondary | ICD-10-CM | POA: Diagnosis not present

## 2018-03-21 DIAGNOSIS — Z23 Encounter for immunization: Secondary | ICD-10-CM | POA: Diagnosis not present

## 2018-03-21 DIAGNOSIS — Z8679 Personal history of other diseases of the circulatory system: Secondary | ICD-10-CM | POA: Diagnosis not present

## 2018-03-21 DIAGNOSIS — R350 Frequency of micturition: Secondary | ICD-10-CM | POA: Diagnosis not present

## 2018-03-21 DIAGNOSIS — Z1389 Encounter for screening for other disorder: Secondary | ICD-10-CM | POA: Diagnosis not present

## 2018-03-25 DIAGNOSIS — I10 Essential (primary) hypertension: Secondary | ICD-10-CM | POA: Diagnosis not present

## 2018-03-25 DIAGNOSIS — M170 Bilateral primary osteoarthritis of knee: Secondary | ICD-10-CM | POA: Diagnosis not present

## 2018-03-25 DIAGNOSIS — M1711 Unilateral primary osteoarthritis, right knee: Secondary | ICD-10-CM | POA: Diagnosis not present

## 2018-03-25 DIAGNOSIS — D500 Iron deficiency anemia secondary to blood loss (chronic): Secondary | ICD-10-CM | POA: Diagnosis not present

## 2018-03-25 DIAGNOSIS — F3342 Major depressive disorder, recurrent, in full remission: Secondary | ICD-10-CM | POA: Diagnosis not present

## 2018-03-28 DIAGNOSIS — M170 Bilateral primary osteoarthritis of knee: Secondary | ICD-10-CM | POA: Diagnosis not present

## 2018-03-28 DIAGNOSIS — F3342 Major depressive disorder, recurrent, in full remission: Secondary | ICD-10-CM | POA: Diagnosis not present

## 2018-03-28 DIAGNOSIS — I10 Essential (primary) hypertension: Secondary | ICD-10-CM | POA: Diagnosis not present

## 2018-03-28 DIAGNOSIS — M1711 Unilateral primary osteoarthritis, right knee: Secondary | ICD-10-CM | POA: Diagnosis not present

## 2018-03-28 DIAGNOSIS — D500 Iron deficiency anemia secondary to blood loss (chronic): Secondary | ICD-10-CM | POA: Diagnosis not present

## 2018-04-18 DIAGNOSIS — M19071 Primary osteoarthritis, right ankle and foot: Secondary | ICD-10-CM | POA: Diagnosis not present

## 2018-04-25 DIAGNOSIS — H02834 Dermatochalasis of left upper eyelid: Secondary | ICD-10-CM | POA: Diagnosis not present

## 2018-04-25 DIAGNOSIS — H57813 Brow ptosis, bilateral: Secondary | ICD-10-CM | POA: Diagnosis not present

## 2018-04-25 DIAGNOSIS — H02831 Dermatochalasis of right upper eyelid: Secondary | ICD-10-CM | POA: Diagnosis not present

## 2018-05-17 DIAGNOSIS — M1711 Unilateral primary osteoarthritis, right knee: Secondary | ICD-10-CM | POA: Diagnosis not present

## 2018-05-17 DIAGNOSIS — F3342 Major depressive disorder, recurrent, in full remission: Secondary | ICD-10-CM | POA: Diagnosis not present

## 2018-05-17 DIAGNOSIS — D500 Iron deficiency anemia secondary to blood loss (chronic): Secondary | ICD-10-CM | POA: Diagnosis not present

## 2018-05-17 DIAGNOSIS — I10 Essential (primary) hypertension: Secondary | ICD-10-CM | POA: Diagnosis not present

## 2018-05-17 DIAGNOSIS — M170 Bilateral primary osteoarthritis of knee: Secondary | ICD-10-CM | POA: Diagnosis not present

## 2018-06-15 DIAGNOSIS — D500 Iron deficiency anemia secondary to blood loss (chronic): Secondary | ICD-10-CM | POA: Diagnosis not present

## 2018-06-15 DIAGNOSIS — M1711 Unilateral primary osteoarthritis, right knee: Secondary | ICD-10-CM | POA: Diagnosis not present

## 2018-06-15 DIAGNOSIS — M170 Bilateral primary osteoarthritis of knee: Secondary | ICD-10-CM | POA: Diagnosis not present

## 2018-06-15 DIAGNOSIS — F3342 Major depressive disorder, recurrent, in full remission: Secondary | ICD-10-CM | POA: Diagnosis not present

## 2018-06-15 DIAGNOSIS — I10 Essential (primary) hypertension: Secondary | ICD-10-CM | POA: Diagnosis not present

## 2018-06-22 DIAGNOSIS — L570 Actinic keratosis: Secondary | ICD-10-CM | POA: Diagnosis not present

## 2018-06-22 DIAGNOSIS — L814 Other melanin hyperpigmentation: Secondary | ICD-10-CM | POA: Diagnosis not present

## 2018-06-22 DIAGNOSIS — L905 Scar conditions and fibrosis of skin: Secondary | ICD-10-CM | POA: Diagnosis not present

## 2018-07-06 DIAGNOSIS — L80 Vitiligo: Secondary | ICD-10-CM | POA: Diagnosis not present

## 2018-07-06 DIAGNOSIS — L658 Other specified nonscarring hair loss: Secondary | ICD-10-CM | POA: Diagnosis not present

## 2018-07-06 DIAGNOSIS — L570 Actinic keratosis: Secondary | ICD-10-CM | POA: Diagnosis not present

## 2018-07-06 DIAGNOSIS — R61 Generalized hyperhidrosis: Secondary | ICD-10-CM | POA: Diagnosis not present

## 2018-07-14 DIAGNOSIS — M1711 Unilateral primary osteoarthritis, right knee: Secondary | ICD-10-CM | POA: Diagnosis not present

## 2018-07-14 DIAGNOSIS — M170 Bilateral primary osteoarthritis of knee: Secondary | ICD-10-CM | POA: Diagnosis not present

## 2018-07-14 DIAGNOSIS — I10 Essential (primary) hypertension: Secondary | ICD-10-CM | POA: Diagnosis not present

## 2018-07-14 DIAGNOSIS — F3342 Major depressive disorder, recurrent, in full remission: Secondary | ICD-10-CM | POA: Diagnosis not present

## 2018-07-14 DIAGNOSIS — D500 Iron deficiency anemia secondary to blood loss (chronic): Secondary | ICD-10-CM | POA: Diagnosis not present

## 2018-08-16 DIAGNOSIS — I10 Essential (primary) hypertension: Secondary | ICD-10-CM | POA: Diagnosis not present

## 2018-08-16 DIAGNOSIS — M1711 Unilateral primary osteoarthritis, right knee: Secondary | ICD-10-CM | POA: Diagnosis not present

## 2018-08-16 DIAGNOSIS — D500 Iron deficiency anemia secondary to blood loss (chronic): Secondary | ICD-10-CM | POA: Diagnosis not present

## 2018-08-16 DIAGNOSIS — M170 Bilateral primary osteoarthritis of knee: Secondary | ICD-10-CM | POA: Diagnosis not present

## 2018-08-16 DIAGNOSIS — F3342 Major depressive disorder, recurrent, in full remission: Secondary | ICD-10-CM | POA: Diagnosis not present

## 2018-09-07 DIAGNOSIS — L820 Inflamed seborrheic keratosis: Secondary | ICD-10-CM | POA: Diagnosis not present

## 2018-09-07 DIAGNOSIS — D485 Neoplasm of uncertain behavior of skin: Secondary | ICD-10-CM | POA: Diagnosis not present

## 2018-09-07 DIAGNOSIS — R61 Generalized hyperhidrosis: Secondary | ICD-10-CM | POA: Diagnosis not present

## 2018-09-07 DIAGNOSIS — L570 Actinic keratosis: Secondary | ICD-10-CM | POA: Diagnosis not present

## 2018-09-07 DIAGNOSIS — L905 Scar conditions and fibrosis of skin: Secondary | ICD-10-CM | POA: Diagnosis not present

## 2018-09-15 DIAGNOSIS — D500 Iron deficiency anemia secondary to blood loss (chronic): Secondary | ICD-10-CM | POA: Diagnosis not present

## 2018-09-15 DIAGNOSIS — F3342 Major depressive disorder, recurrent, in full remission: Secondary | ICD-10-CM | POA: Diagnosis not present

## 2018-09-15 DIAGNOSIS — M170 Bilateral primary osteoarthritis of knee: Secondary | ICD-10-CM | POA: Diagnosis not present

## 2018-09-15 DIAGNOSIS — M1711 Unilateral primary osteoarthritis, right knee: Secondary | ICD-10-CM | POA: Diagnosis not present

## 2018-09-15 DIAGNOSIS — I10 Essential (primary) hypertension: Secondary | ICD-10-CM | POA: Diagnosis not present

## 2018-10-10 DIAGNOSIS — F3342 Major depressive disorder, recurrent, in full remission: Secondary | ICD-10-CM | POA: Diagnosis not present

## 2018-10-10 DIAGNOSIS — I10 Essential (primary) hypertension: Secondary | ICD-10-CM | POA: Diagnosis not present

## 2018-10-10 DIAGNOSIS — M170 Bilateral primary osteoarthritis of knee: Secondary | ICD-10-CM | POA: Diagnosis not present

## 2018-10-10 DIAGNOSIS — M1711 Unilateral primary osteoarthritis, right knee: Secondary | ICD-10-CM | POA: Diagnosis not present

## 2018-10-10 DIAGNOSIS — D500 Iron deficiency anemia secondary to blood loss (chronic): Secondary | ICD-10-CM | POA: Diagnosis not present

## 2018-10-18 DIAGNOSIS — H43811 Vitreous degeneration, right eye: Secondary | ICD-10-CM | POA: Diagnosis not present

## 2018-10-18 DIAGNOSIS — Z961 Presence of intraocular lens: Secondary | ICD-10-CM | POA: Diagnosis not present

## 2018-10-18 DIAGNOSIS — B394 Histoplasmosis capsulati, unspecified: Secondary | ICD-10-CM | POA: Diagnosis not present

## 2018-10-24 DIAGNOSIS — R1310 Dysphagia, unspecified: Secondary | ICD-10-CM | POA: Diagnosis not present

## 2018-10-24 DIAGNOSIS — K219 Gastro-esophageal reflux disease without esophagitis: Secondary | ICD-10-CM | POA: Diagnosis not present

## 2018-11-11 DIAGNOSIS — M1711 Unilateral primary osteoarthritis, right knee: Secondary | ICD-10-CM | POA: Diagnosis not present

## 2018-11-11 DIAGNOSIS — M170 Bilateral primary osteoarthritis of knee: Secondary | ICD-10-CM | POA: Diagnosis not present

## 2018-11-11 DIAGNOSIS — D500 Iron deficiency anemia secondary to blood loss (chronic): Secondary | ICD-10-CM | POA: Diagnosis not present

## 2018-11-11 DIAGNOSIS — I10 Essential (primary) hypertension: Secondary | ICD-10-CM | POA: Diagnosis not present

## 2018-11-11 DIAGNOSIS — F3342 Major depressive disorder, recurrent, in full remission: Secondary | ICD-10-CM | POA: Diagnosis not present

## 2018-12-20 DIAGNOSIS — M170 Bilateral primary osteoarthritis of knee: Secondary | ICD-10-CM | POA: Diagnosis not present

## 2018-12-20 DIAGNOSIS — F3342 Major depressive disorder, recurrent, in full remission: Secondary | ICD-10-CM | POA: Diagnosis not present

## 2018-12-20 DIAGNOSIS — I10 Essential (primary) hypertension: Secondary | ICD-10-CM | POA: Diagnosis not present

## 2018-12-20 DIAGNOSIS — M1711 Unilateral primary osteoarthritis, right knee: Secondary | ICD-10-CM | POA: Diagnosis not present

## 2018-12-20 DIAGNOSIS — D500 Iron deficiency anemia secondary to blood loss (chronic): Secondary | ICD-10-CM | POA: Diagnosis not present

## 2019-01-09 DIAGNOSIS — D500 Iron deficiency anemia secondary to blood loss (chronic): Secondary | ICD-10-CM | POA: Diagnosis not present

## 2019-01-09 DIAGNOSIS — F3342 Major depressive disorder, recurrent, in full remission: Secondary | ICD-10-CM | POA: Diagnosis not present

## 2019-01-09 DIAGNOSIS — M170 Bilateral primary osteoarthritis of knee: Secondary | ICD-10-CM | POA: Diagnosis not present

## 2019-01-09 DIAGNOSIS — I10 Essential (primary) hypertension: Secondary | ICD-10-CM | POA: Diagnosis not present

## 2019-01-09 DIAGNOSIS — M1711 Unilateral primary osteoarthritis, right knee: Secondary | ICD-10-CM | POA: Diagnosis not present

## 2019-02-28 IMAGING — DX DG CHEST 2V
2 series · 2 of 2 positions shown · non-contrast
Comparison: None.

CLINICAL DATA: Syncope

EXAM:
CHEST  2 VIEW

[chest pa]
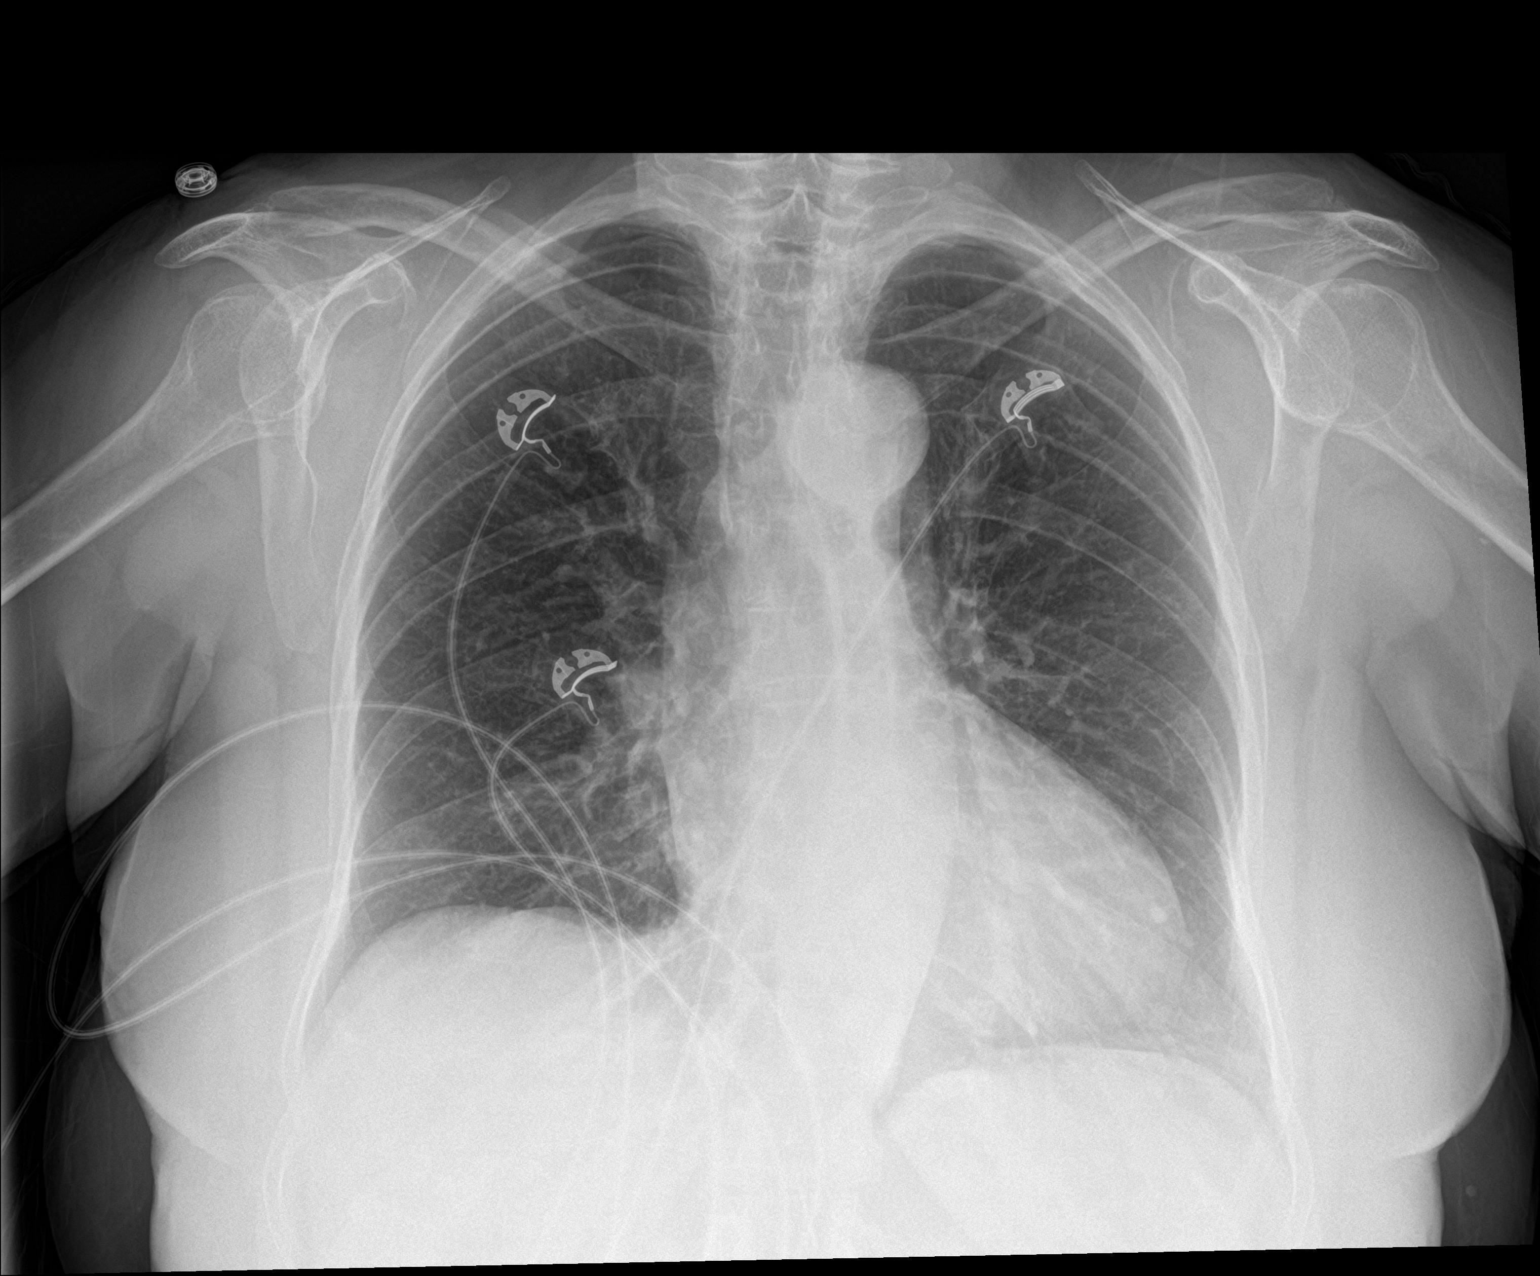

[chest lat]
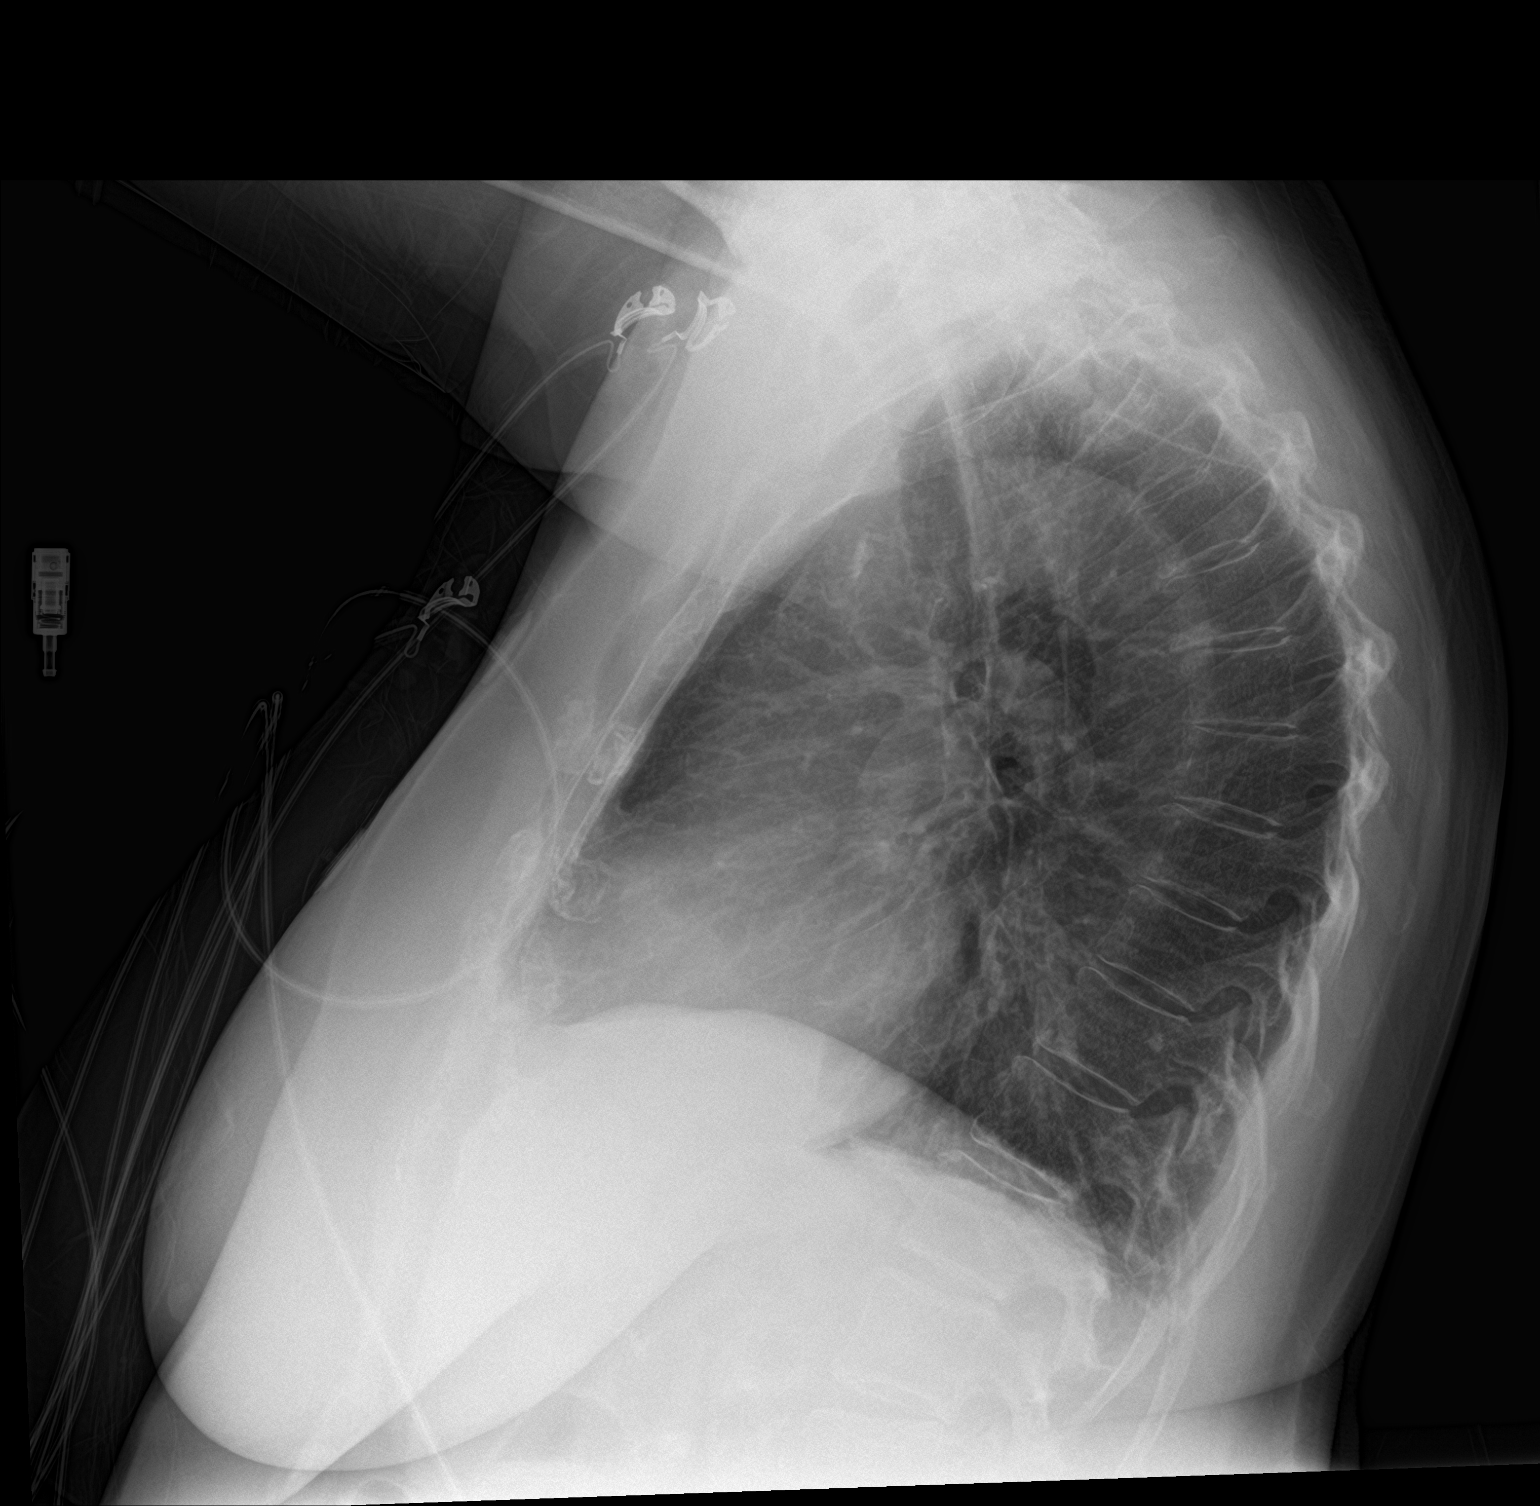

[2 of 2 positions shown; findings below may reference images not displayed]

FINDINGS: Lungs are clear.  No pleural effusion or pneumothorax.

The heart is normal in size.

Mildly exaggerated thoracic kyphosis.
IMPRESSION: No active cardiopulmonary disease.

## 2019-03-10 ENCOUNTER — Ambulatory Visit: Payer: Medicare Other | Attending: Internal Medicine

## 2019-03-10 DIAGNOSIS — Z23 Encounter for immunization: Secondary | ICD-10-CM

## 2019-03-10 NOTE — Progress Notes (Signed)
   Covid-19 Vaccination Clinic  Name:  Dominique Griffith    MRN: [id number] DOB: 10-Aug-1940  03/10/2019  [name] was observed post Covid-19 immunization for 15 minutes without incidence. She was provided with Vaccine Information Sheet and instruction to access the V-Safe system.   [name] was instructed to call 911 with any severe reactions post vaccine: Marland Kitchen Difficulty breathing  . Swelling of your face and throat  . A fast heartbeat  . A bad rash all over your body  . Dizziness and weakness    Immunizations Administered    Name Date Dose VIS Date Route   Pfizer COVID-19 Vaccine 03/10/2019 10:45 AM 0.3 mL 02/03/2019 Intramuscular   Manufacturer: [employer]   Lot: [id number]   [place]: [id number]

## 2019-03-29 ENCOUNTER — Ambulatory Visit: Payer: PPO

## 2019-03-29 ENCOUNTER — Ambulatory Visit: Payer: PPO | Attending: Internal Medicine

## 2019-03-29 DIAGNOSIS — Z23 Encounter for immunization: Secondary | ICD-10-CM | POA: Insufficient documentation

## 2019-03-29 NOTE — Progress Notes (Signed)
   Covid-19 Vaccination Clinic  Name:  Dominique Griffith    MRN: [id number] DOB: 1941-01-03  03/29/2019  [name] was observed post Covid-19 immunization for 15 minutes without incidence. She was provided with Vaccine Information Sheet and instruction to access the V-Safe system.   [name] was instructed to call 911 with any severe reactions post vaccine: Marland Kitchen Difficulty breathing  . Swelling of your face and throat  . A fast heartbeat  . A bad rash all over your body  . Dizziness and weakness    Immunizations Administered    Name Date Dose VIS Date Route   Pfizer COVID-19 Vaccine 03/29/2019  9:10 AM 0.3 mL 02/03/2019 Intramuscular   Manufacturer: [place]   Lot: [id number]   [place]: [id number]

## 2019-04-13 DIAGNOSIS — Z1389 Encounter for screening for other disorder: Secondary | ICD-10-CM | POA: Diagnosis not present

## 2019-04-13 DIAGNOSIS — I10 Essential (primary) hypertension: Secondary | ICD-10-CM | POA: Diagnosis not present

## 2019-04-13 DIAGNOSIS — Z0000 Encounter for general adult medical examination without abnormal findings: Secondary | ICD-10-CM | POA: Diagnosis not present

## 2019-04-13 DIAGNOSIS — K219 Gastro-esophageal reflux disease without esophagitis: Secondary | ICD-10-CM | POA: Diagnosis not present

## 2019-04-13 DIAGNOSIS — F329 Major depressive disorder, single episode, unspecified: Secondary | ICD-10-CM | POA: Diagnosis not present

## 2019-04-13 DIAGNOSIS — J3089 Other allergic rhinitis: Secondary | ICD-10-CM | POA: Diagnosis not present

## 2019-04-14 ENCOUNTER — Other Ambulatory Visit: Payer: Self-pay | Admitting: Internal Medicine

## 2019-04-14 DIAGNOSIS — Z1231 Encounter for screening mammogram for malignant neoplasm of breast: Secondary | ICD-10-CM

## 2019-04-17 DIAGNOSIS — F329 Major depressive disorder, single episode, unspecified: Secondary | ICD-10-CM | POA: Diagnosis not present

## 2019-04-17 DIAGNOSIS — I10 Essential (primary) hypertension: Secondary | ICD-10-CM | POA: Diagnosis not present

## 2019-05-22 ENCOUNTER — Ambulatory Visit
Admission: RE | Admit: 2019-05-22 | Discharge: 2019-05-22 | Disposition: A | Discharge status: Home / Self Care | Payer: PPO | Source: Ambulatory Visit | Attending: Internal Medicine | Admitting: Internal Medicine

## 2019-05-22 ENCOUNTER — Other Ambulatory Visit: Payer: Self-pay

## 2019-05-22 DIAGNOSIS — Z1231 Encounter for screening mammogram for malignant neoplasm of breast: Secondary | ICD-10-CM

## 2019-06-02 DIAGNOSIS — L309 Dermatitis, unspecified: Secondary | ICD-10-CM | POA: Diagnosis not present

## 2019-06-02 DIAGNOSIS — R61 Generalized hyperhidrosis: Secondary | ICD-10-CM | POA: Diagnosis not present

## 2019-07-07 DIAGNOSIS — L570 Actinic keratosis: Secondary | ICD-10-CM | POA: Diagnosis not present

## 2019-07-07 DIAGNOSIS — R61 Generalized hyperhidrosis: Secondary | ICD-10-CM | POA: Diagnosis not present

## 2019-07-07 DIAGNOSIS — L573 Poikiloderma of Civatte: Secondary | ICD-10-CM | POA: Diagnosis not present

## 2019-07-07 DIAGNOSIS — L309 Dermatitis, unspecified: Secondary | ICD-10-CM | POA: Diagnosis not present

## 2019-07-07 DIAGNOSIS — W57XXXA Bitten or stung by nonvenomous insect and other nonvenomous arthropods, initial encounter: Secondary | ICD-10-CM | POA: Diagnosis not present

## 2019-08-18 DIAGNOSIS — M79641 Pain in right hand: Secondary | ICD-10-CM | POA: Diagnosis not present

## 2019-08-18 DIAGNOSIS — M79671 Pain in right foot: Secondary | ICD-10-CM | POA: Diagnosis not present

## 2019-08-21 DIAGNOSIS — R61 Generalized hyperhidrosis: Secondary | ICD-10-CM | POA: Diagnosis not present

## 2019-08-21 DIAGNOSIS — L309 Dermatitis, unspecified: Secondary | ICD-10-CM | POA: Diagnosis not present

## 2019-08-21 DIAGNOSIS — L821 Other seborrheic keratosis: Secondary | ICD-10-CM | POA: Diagnosis not present

## 2019-08-21 DIAGNOSIS — L570 Actinic keratosis: Secondary | ICD-10-CM | POA: Diagnosis not present

## 2019-09-05 IMAGING — DX DG KNEE 1-2V PORT*R*
2 series · 2 of 2 positions shown · non-contrast
Comparison: None.

CLINICAL DATA: Postop

EXAM:
PORTABLE RIGHT KNEE - 1-2 VIEW

[knee ap]
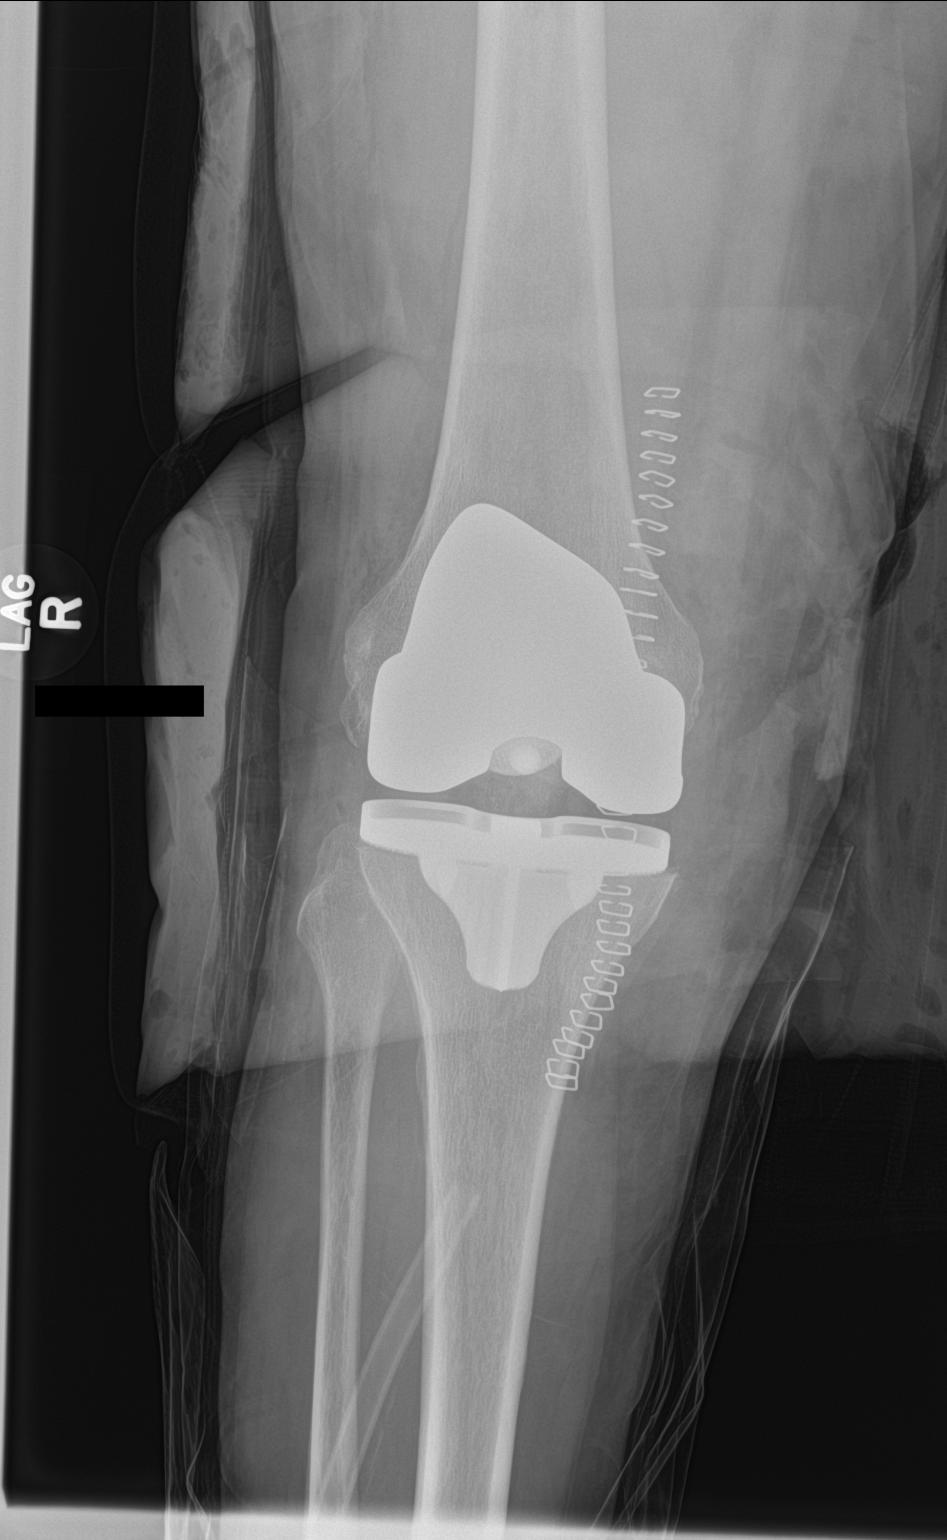

[knee tunnel]
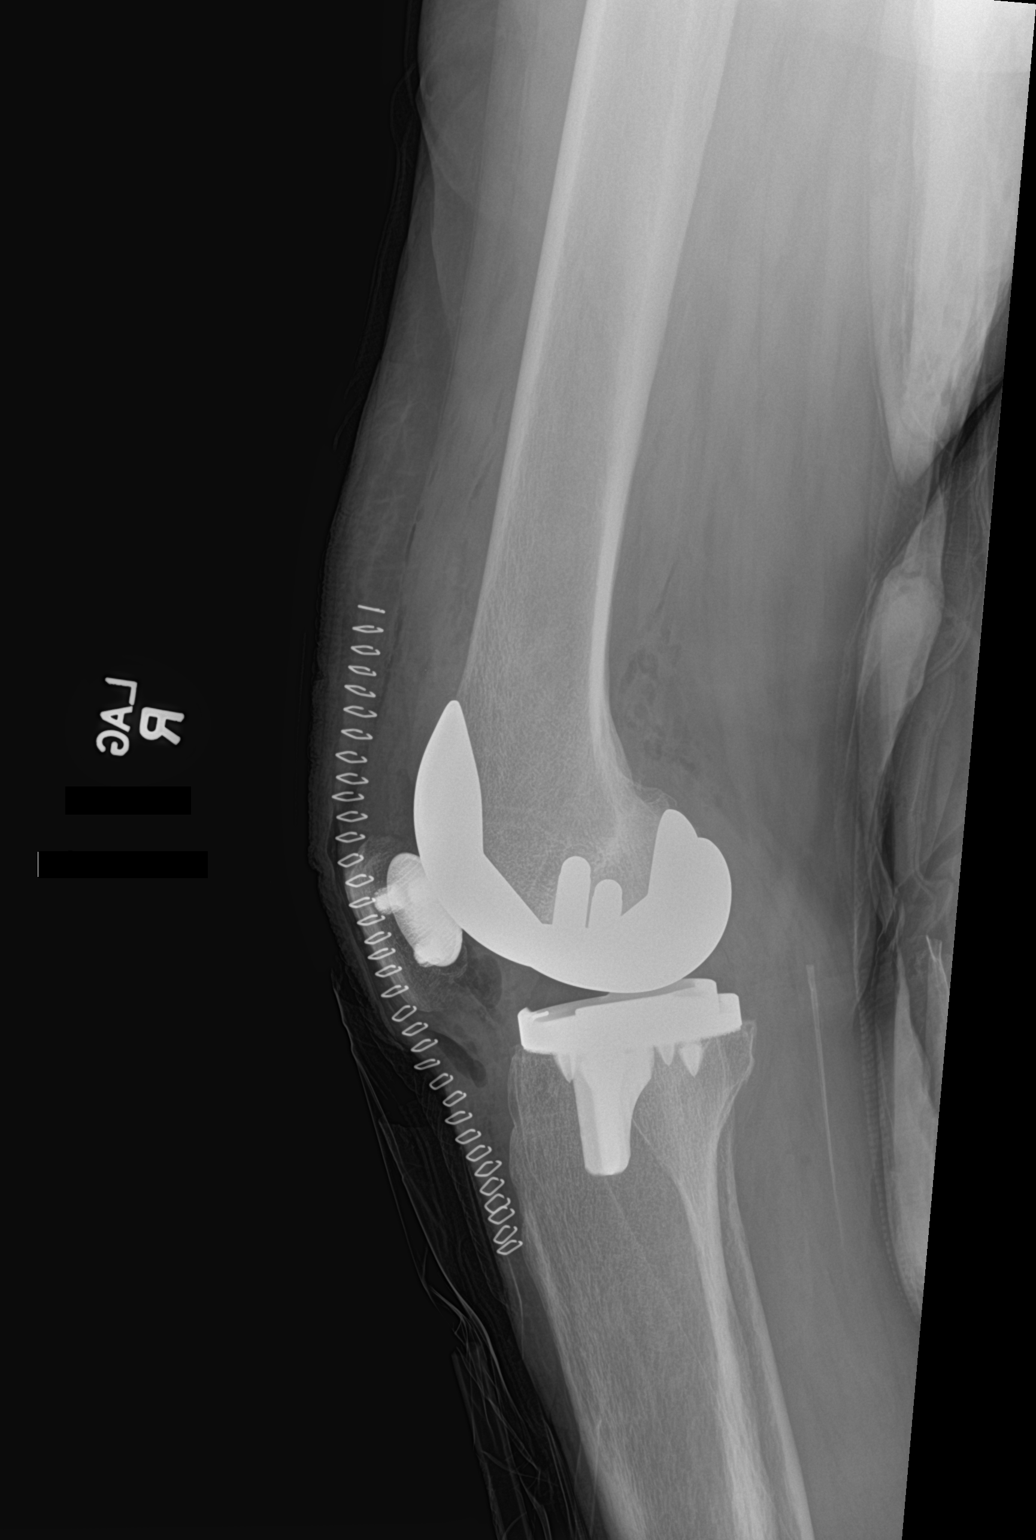

[2 of 2 positions shown; findings below may reference images not displayed]

FINDINGS: Changes of right knee replacement. No hardware bony complicating
feature. Soft tissue and joint space gas noted.
IMPRESSION: Right knee replacement.  No complicating feature.

## 2019-10-18 DIAGNOSIS — H0102B Squamous blepharitis left eye, upper and lower eyelids: Secondary | ICD-10-CM | POA: Diagnosis not present

## 2019-10-18 DIAGNOSIS — H26492 Other secondary cataract, left eye: Secondary | ICD-10-CM | POA: Diagnosis not present

## 2019-10-18 DIAGNOSIS — H43811 Vitreous degeneration, right eye: Secondary | ICD-10-CM | POA: Diagnosis not present

## 2019-10-18 DIAGNOSIS — Z961 Presence of intraocular lens: Secondary | ICD-10-CM | POA: Diagnosis not present

## 2019-10-18 DIAGNOSIS — H0102A Squamous blepharitis right eye, upper and lower eyelids: Secondary | ICD-10-CM | POA: Diagnosis not present

## 2019-10-24 IMAGING — DX DG KNEE 1-2V PORT*L*
2 series · 2 of 2 positions shown · non-contrast
Comparison: None.

CLINICAL DATA: Status post left knee replacement

EXAM:
PORTABLE LEFT KNEE - 1-2 VIEW

[knee ap]
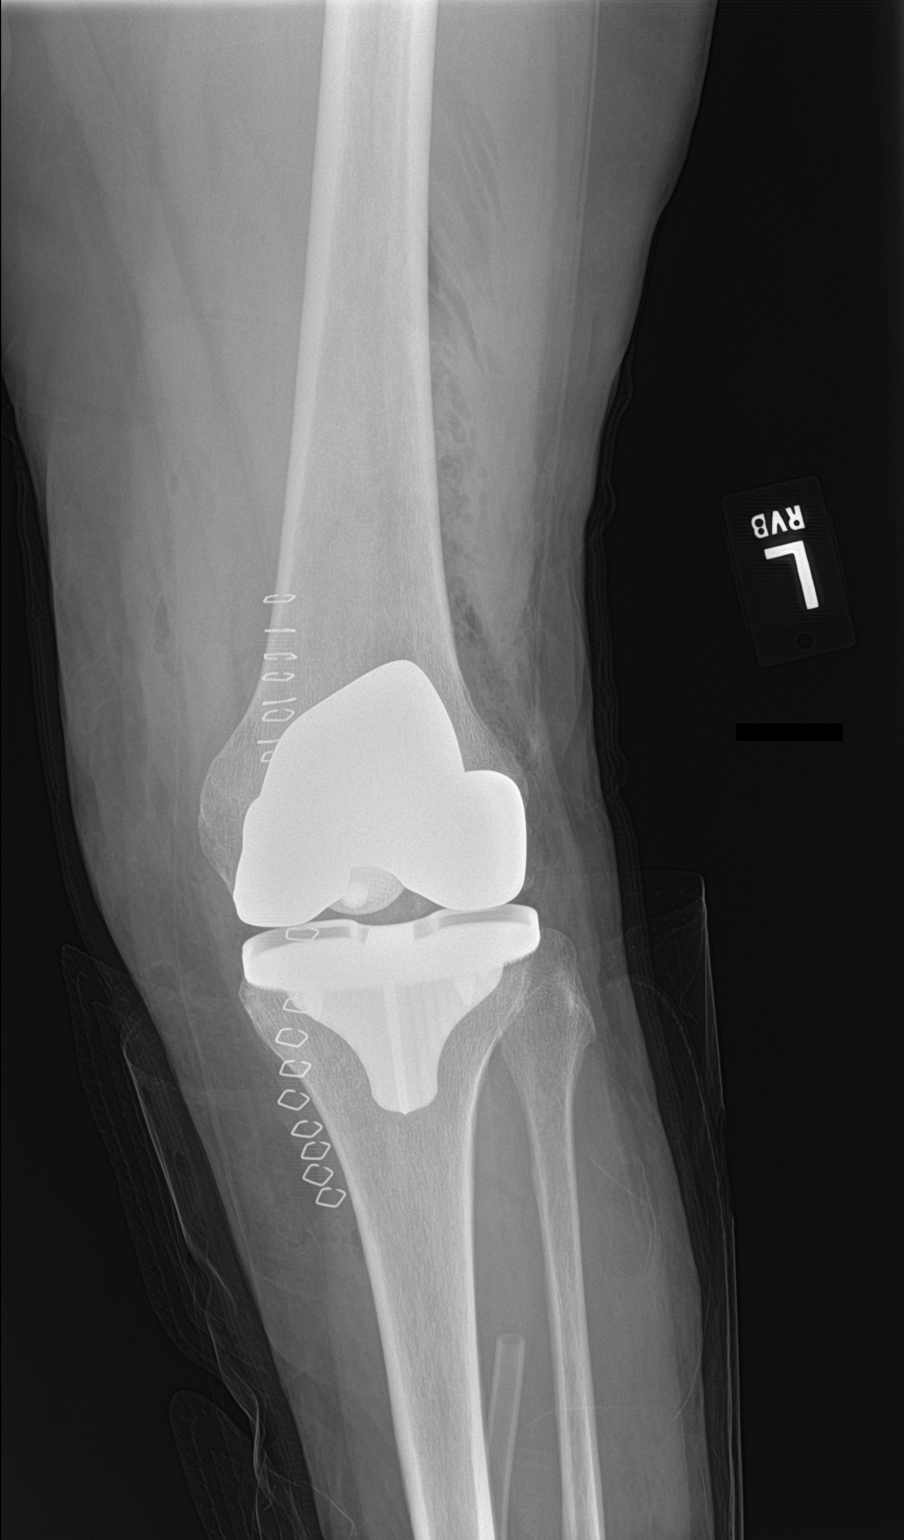

[knee lat]
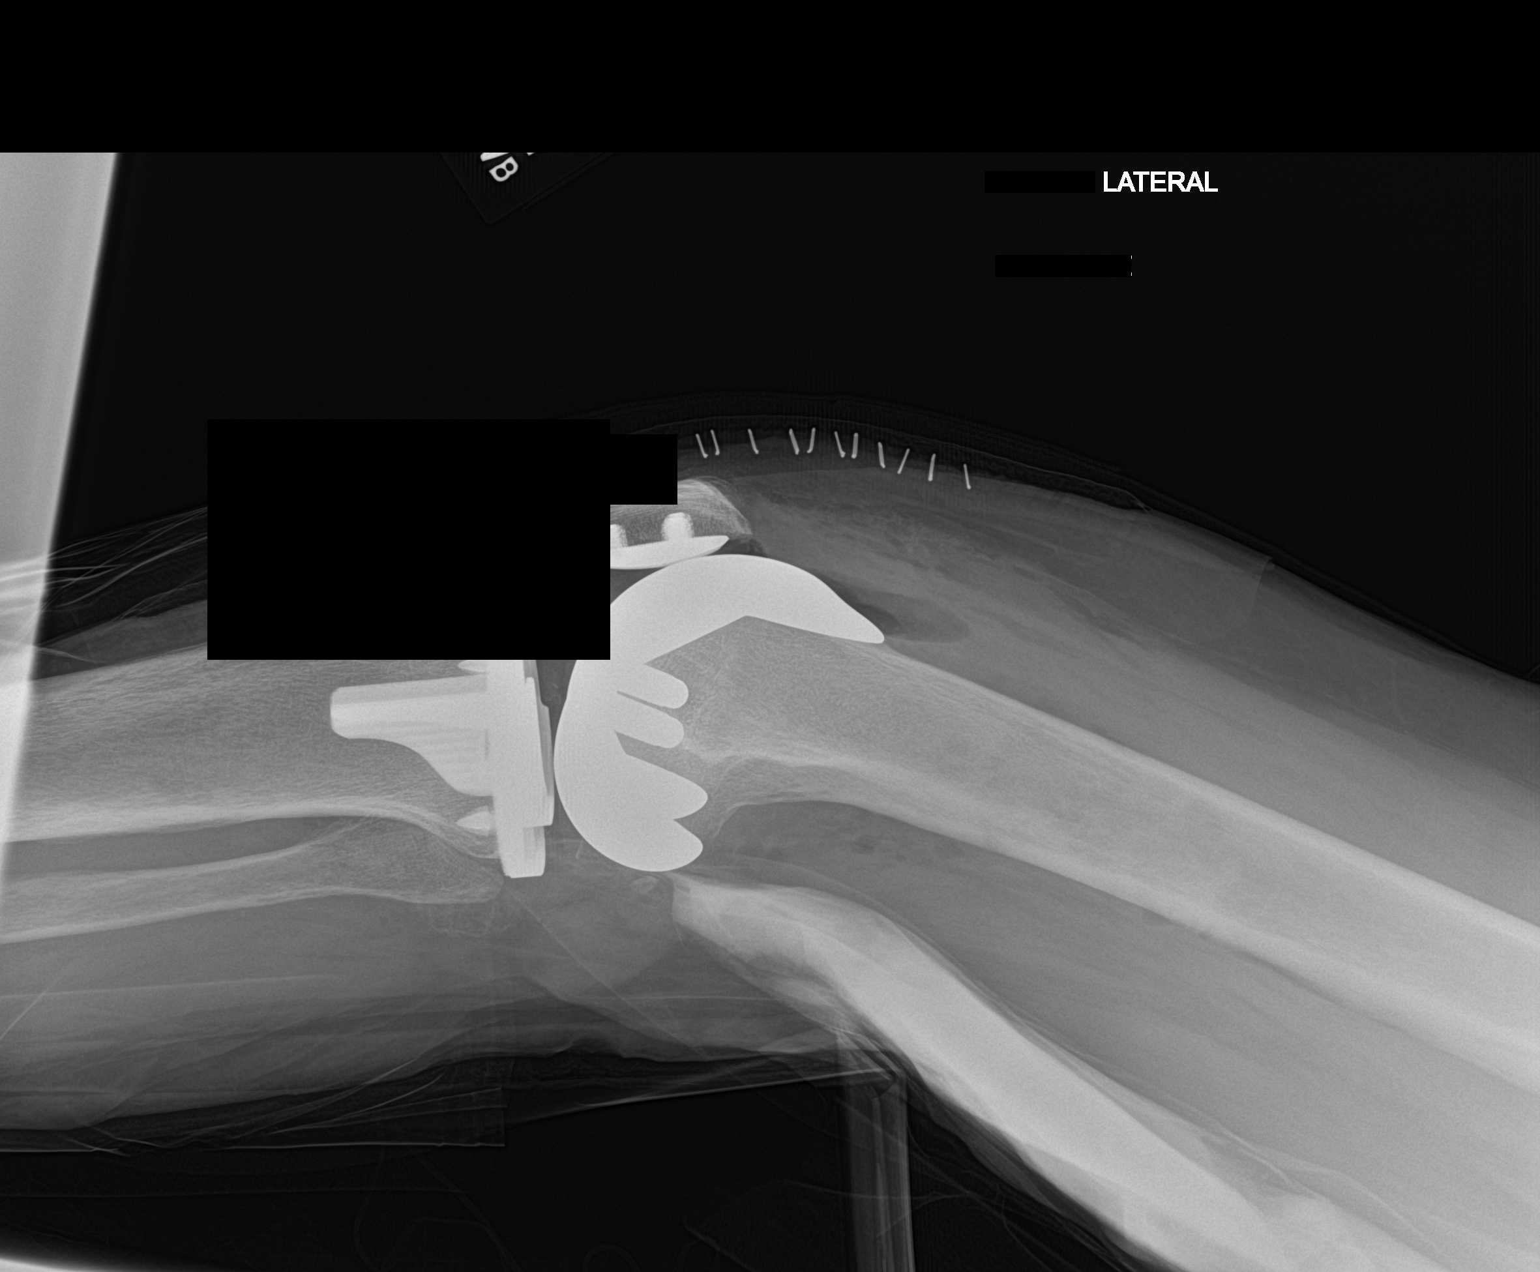

[2 of 2 positions shown; findings below may reference images not displayed]

FINDINGS: Left knee prosthesis is noted in satisfactory position. No acute
bony or soft tissue abnormality is noted.
IMPRESSION: Status post left knee replacement

## 2019-11-01 ENCOUNTER — Emergency Department (HOSPITAL_COMMUNITY)
Admission: EM | Admit: 2019-11-01 | Discharge: 2019-11-02 | Disposition: A | Discharge status: Home / Self Care | Payer: PPO | Attending: Emergency Medicine | Admitting: Emergency Medicine

## 2019-11-01 ENCOUNTER — Other Ambulatory Visit: Payer: Self-pay

## 2019-11-01 ENCOUNTER — Encounter (HOSPITAL_COMMUNITY): Payer: Self-pay

## 2019-11-01 DIAGNOSIS — Z5321 Procedure and treatment not carried out due to patient leaving prior to being seen by health care provider: Secondary | ICD-10-CM | POA: Diagnosis not present

## 2019-11-01 DIAGNOSIS — Z2914 Encounter for prophylactic rabies immune globin: Secondary | ICD-10-CM | POA: Diagnosis not present
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
## 2019-11-01 NOTE — ED Notes (Signed)
Pt state they did not want to wait any longer. I spoke with the charge nurse and advised she needed a rabies shot and charge nurse said we would get the pt back as quickly as possible. The pt still did not want to wait and wanted to leave as the wait time was too long. I continued to encourage the pt to stay and she left anyway.

## 2019-11-02 DIAGNOSIS — Z87891 Personal history of nicotine dependence: Secondary | ICD-10-CM | POA: Diagnosis not present

## 2019-11-02 DIAGNOSIS — R031 Nonspecific low blood-pressure reading: Secondary | ICD-10-CM | POA: Diagnosis not present

## 2019-11-02 DIAGNOSIS — Z203 Contact with and (suspected) exposure to rabies: Secondary | ICD-10-CM | POA: Diagnosis not present

## 2019-11-02 DIAGNOSIS — Z23 Encounter for immunization: Secondary | ICD-10-CM | POA: Diagnosis not present

## 2019-11-02 DIAGNOSIS — Z2914 Encounter for prophylactic rabies immune globin: Secondary | ICD-10-CM | POA: Diagnosis not present

## 2019-11-05 DIAGNOSIS — Z203 Contact with and (suspected) exposure to rabies: Secondary | ICD-10-CM | POA: Diagnosis not present

## 2019-11-05 DIAGNOSIS — Z23 Encounter for immunization: Secondary | ICD-10-CM | POA: Diagnosis not present

## 2020-03-12 DIAGNOSIS — Z1152 Encounter for screening for COVID-19: Secondary | ICD-10-CM | POA: Diagnosis not present

## 2020-03-29 ENCOUNTER — Other Ambulatory Visit: Payer: Self-pay | Admitting: Physician Assistant

## 2020-03-29 DIAGNOSIS — K219 Gastro-esophageal reflux disease without esophagitis: Secondary | ICD-10-CM | POA: Diagnosis not present

## 2020-03-29 DIAGNOSIS — K5900 Constipation, unspecified: Secondary | ICD-10-CM | POA: Diagnosis not present

## 2020-03-29 DIAGNOSIS — R1319 Other dysphagia: Secondary | ICD-10-CM

## 2020-03-29 DIAGNOSIS — R1310 Dysphagia, unspecified: Secondary | ICD-10-CM | POA: Diagnosis not present

## 2020-04-02 ENCOUNTER — Ambulatory Visit
Admission: RE | Admit: 2020-04-02 | Discharge: 2020-04-02 | Disposition: A | Discharge status: Home / Self Care | Payer: PPO | Source: Ambulatory Visit | Attending: Physician Assistant | Admitting: Physician Assistant

## 2020-04-02 DIAGNOSIS — R1319 Other dysphagia: Secondary | ICD-10-CM

## 2020-04-02 DIAGNOSIS — K225 Diverticulum of esophagus, acquired: Secondary | ICD-10-CM | POA: Diagnosis not present

## 2020-04-11 ENCOUNTER — Other Ambulatory Visit: Payer: Self-pay | Admitting: Gastroenterology

## 2020-05-08 DIAGNOSIS — Z79899 Other long term (current) drug therapy: Secondary | ICD-10-CM | POA: Diagnosis not present

## 2020-05-08 DIAGNOSIS — F322 Major depressive disorder, single episode, severe without psychotic features: Secondary | ICD-10-CM | POA: Diagnosis not present

## 2020-05-08 DIAGNOSIS — M170 Bilateral primary osteoarthritis of knee: Secondary | ICD-10-CM | POA: Diagnosis not present

## 2020-05-08 DIAGNOSIS — M79676 Pain in unspecified toe(s): Secondary | ICD-10-CM | POA: Diagnosis not present

## 2020-05-08 DIAGNOSIS — E559 Vitamin D deficiency, unspecified: Secondary | ICD-10-CM | POA: Diagnosis not present

## 2020-05-08 DIAGNOSIS — Z0000 Encounter for general adult medical examination without abnormal findings: Secondary | ICD-10-CM | POA: Diagnosis not present

## 2020-05-08 DIAGNOSIS — M8580 Other specified disorders of bone density and structure, unspecified site: Secondary | ICD-10-CM | POA: Diagnosis not present

## 2020-05-08 DIAGNOSIS — I10 Essential (primary) hypertension: Secondary | ICD-10-CM | POA: Diagnosis not present

## 2020-05-09 ENCOUNTER — Other Ambulatory Visit: Payer: Self-pay | Admitting: Internal Medicine

## 2020-05-09 DIAGNOSIS — M8580 Other specified disorders of bone density and structure, unspecified site: Secondary | ICD-10-CM

## 2020-07-12 ENCOUNTER — Encounter (HOSPITAL_COMMUNITY): Payer: Self-pay | Admitting: Gastroenterology

## 2020-07-12 ENCOUNTER — Other Ambulatory Visit: Payer: Self-pay

## 2020-07-12 NOTE — Progress Notes (Signed)
Attempted to obtain medical history via telephone, unable to reach at this time. I left a voicemail to return pre surgical testing department's phone call.  

## 2020-07-16 ENCOUNTER — Ambulatory Visit (HOSPITAL_COMMUNITY): Payer: PPO | Admitting: Anesthesiology

## 2020-07-16 ENCOUNTER — Ambulatory Visit (HOSPITAL_COMMUNITY)
Admission: RE | Admit: 2020-07-16 | Discharge: 2020-07-16 | Disposition: A | Discharge status: Home / Self Care | Payer: PPO | Attending: Gastroenterology | Admitting: Gastroenterology

## 2020-07-16 ENCOUNTER — Encounter (HOSPITAL_COMMUNITY): Payer: Self-pay | Admitting: Gastroenterology

## 2020-07-16 ENCOUNTER — Other Ambulatory Visit: Payer: Self-pay

## 2020-07-16 ENCOUNTER — Encounter (HOSPITAL_COMMUNITY)
Admission: RE | Disposition: A | Discharge status: Home / Self Care | Payer: Self-pay | Source: Home / Self Care | Attending: Gastroenterology

## 2020-07-16 DIAGNOSIS — R1310 Dysphagia, unspecified: Secondary | ICD-10-CM | POA: Insufficient documentation

## 2020-07-16 DIAGNOSIS — Z880 Allergy status to penicillin: Secondary | ICD-10-CM | POA: Diagnosis not present

## 2020-07-16 DIAGNOSIS — Z96653 Presence of artificial knee joint, bilateral: Secondary | ICD-10-CM | POA: Insufficient documentation

## 2020-07-16 DIAGNOSIS — Z79899 Other long term (current) drug therapy: Secondary | ICD-10-CM | POA: Diagnosis not present

## 2020-07-16 DIAGNOSIS — Z87891 Personal history of nicotine dependence: Secondary | ICD-10-CM | POA: Diagnosis not present

## 2020-07-16 DIAGNOSIS — I10 Essential (primary) hypertension: Secondary | ICD-10-CM | POA: Diagnosis not present

## 2020-07-16 DIAGNOSIS — Z7982 Long term (current) use of aspirin: Secondary | ICD-10-CM | POA: Diagnosis not present

## 2020-07-16 DIAGNOSIS — K449 Diaphragmatic hernia without obstruction or gangrene: Secondary | ICD-10-CM | POA: Diagnosis not present

## 2020-07-16 DIAGNOSIS — K219 Gastro-esophageal reflux disease without esophagitis: Secondary | ICD-10-CM | POA: Insufficient documentation

## 2020-07-16 DIAGNOSIS — Q396 Congenital diverticulum of esophagus: Secondary | ICD-10-CM | POA: Diagnosis not present

## 2020-07-16 DIAGNOSIS — M170 Bilateral primary osteoarthritis of knee: Secondary | ICD-10-CM | POA: Diagnosis not present

## 2020-07-16 DIAGNOSIS — R933 Abnormal findings on diagnostic imaging of other parts of digestive tract: Secondary | ICD-10-CM | POA: Diagnosis not present

## 2020-07-16 HISTORY — PX: ESOPHAGOGASTRODUODENOSCOPY (EGD) WITH PROPOFOL: SHX5813

## 2020-07-16 SURGERY — ESOPHAGOGASTRODUODENOSCOPY (EGD) WITH PROPOFOL
Anesthesia: Monitor Anesthesia Care

## 2020-07-16 MED ORDER — LIDOCAINE 2% (20 MG/ML) 5 ML SYRINGE
INTRAMUSCULAR | Status: DC | PRN
  Administered 2020-07-16: 60 mg via INTRAVENOUS

## 2020-07-16 MED ORDER — LACTATED RINGERS IV SOLN: INTRAVENOUS | Status: DC

## 2020-07-16 MED ORDER — PROPOFOL 500 MG/50ML IV EMUL
INTRAVENOUS | Status: AC
  Filled 2020-07-16: qty 50

## 2020-07-16 MED ORDER — PROPOFOL 500 MG/50ML IV EMUL
INTRAVENOUS | Status: DC | PRN
  Administered 2020-07-16: 125 ug/kg/min via INTRAVENOUS

## 2020-07-16 MED ORDER — SODIUM CHLORIDE 0.9 % IV SOLN: INTRAVENOUS | Status: DC

## 2020-07-16 MED ORDER — PROPOFOL 10 MG/ML IV BOLUS
INTRAVENOUS | Status: DC | PRN
  Administered 2020-07-16: 30 mg via INTRAVENOUS

## 2020-07-16 SURGICAL SUPPLY — 14 items

## 2020-07-16 NOTE — H&P (Signed)
[facility] H/P  Chief Complaint: dysphagia, GERD  HPI: Dominique Griffith is an 80 y.o. female.  With GERD and solid-dysphagia.  Esophagram showed small Zenker's and possible GEJ stricture.  Past Medical History:  Diagnosis Date  . Anxiety   . Complication of anesthesia    BACK SURGERY IN 2010; WAS PUT UNDER GENERAL ANESTHESIA ; REPORTS MEMORY LOSS AND CHANGE IN BEHAVIOR FOR 2 MONTHS POST-OP ; STILL OCCASIONALLY HAS TIMES SHE FORGETS THINGS   . Depression   . GERD (gastroesophageal reflux disease)   . Hypertension   . Syncope and collapse    6 MONTHS AGO HAD SYNCOPE AND COLLAPSE ; SAW CARDIOLOGIST;  HAD UNREMARKABLE ECHO ;     Past Surgical History:  Procedure Laterality Date  . BACK SURGERY  2010   LUMBAR FUSION  WITH RODS IN PLACE  . KNEE ARTHROPLASTY Right 04/22/2017   Procedure: RIGHT TOTAL KNEE ARTHROPLASTY WITH COMPUTER NAVIGATION;  Surgeon: Rod Can, MD;  Location: [facility];  Service: Orthopedics;  Laterality: Right;  Needs RNFA  . KNEE ARTHROPLASTY Left 06/10/2017   Procedure: LEFT TOTAL KNEE ARTHROPLASTY WITH COMPUTER NAVIGATION;  Surgeon: Rod Can, MD;  Location: [facility];  Service: Orthopedics;  Laterality: Left;  Needs RNFA    Medications Prior to Admission  Medication Sig Dispense Refill  . aspirin EC 81 MG tablet Take 81 mg by mouth daily. Swallow whole.    . baclofen (LIORESAL) 10 MG tablet Take 10 mg by mouth 2 (two) times daily with a meal.     . cetirizine (ZYRTEC) 10 MG tablet Take 10 mg by mouth daily.    . COD LIVER OIL PO Take 750 mg by mouth daily.    . diclofenac Sodium (VOLTAREN) 1 % GEL Apply 1 application topically 4 (four) times daily as needed (pain).    Marland Kitchen diphenhydrAMINE (BENADRYL) 25 mg capsule Take 25 mg by mouth daily as needed for allergies.    Marland Kitchen escitalopram (LEXAPRO) 20 MG tablet Take 20 mg by mouth daily.    . famotidine (PEPCID) 40 MG tablet Take 40 mg by mouth 2 (two) times daily.     Marland Kitchen losartan (COZAAR) 100 MG tablet Take  100 mg by mouth daily.    . Misc Natural Products (GLUCOSAMINE CHOND MSM FORMULA PO) Take 1 tablet by mouth daily.    . Multiple Vitamins-Minerals (MULTIVITAMIN PO) Take 1 tablet by mouth daily.    Marland Kitchen olopatadine (PATANOL) 0.1 % ophthalmic solution Place 1 drop into both eyes 2 (two) times daily as needed for allergies.    . pantoprazole (PROTONIX) 40 MG tablet Take 40 mg by mouth daily.    Vladimir Faster Glycol-Propyl Glycol (SYSTANE) 0.4-0.3 % SOLN Place 1 drop into both eyes daily as needed (dry eyes).    Marland Kitchen TART CHERRY ULTRA PO Take 1 capsule by mouth daily. With Turmeric      Allergies:  Allergies  Allergen Reactions  . Amoxicillin Rash    Has patient had a PCN reaction causing immediate rash, facial/tongue/throat swelling, SOB or lightheadedness with hypotension: No Has patient had a PCN reaction causing severe rash involving mucus membranes or skin necrosis: No Has patient had a PCN reaction that required hospitalization: No Has patient had a PCN reaction occurring within the last 10 years: No If all of the above answers are "NO", then may proceed with Cephalosporin use.   Marland Kitchen Penicillins Rash and Other (See Comments)    Has patient had a PCN reaction causing immediate rash, facial/tongue/throat  swelling, SOB or lightheadedness with hypotension: No Has patient had a PCN reaction causing severe rash involving mucus membranes or skin necrosis: No Has patient had a PCN reaction that required hospitalization: No Has patient had a PCN reaction occurring within the last 10 years: No If all of the above answers are "NO", then may proceed with Cephalosporin use.    Family History  Problem Relation Age of Onset  . Stroke Mother   . Heart attack Father     Social History:  reports that she has quit smoking. Her smoking use included cigarettes. She quit after 20.00 years of use. She has never used smokeless tobacco. She reports current alcohol use. She reports that she does not use  drugs.   ROS As per HPI, all others negative   Blood pressure (!) 150/54, pulse (!) 57, temperature 98.1 F (36.7 C), temperature source Oral, resp. rate 13, height 5' 3.5" (1.613 m), weight 72.6 kg. General appearance: NAD HEENT:  NCAT, anicteric CV:  Regular ABD:  Soft, non-tender NEURO:  Non-focal, A/O  No results found for this or any previous visit (from the past 48 hour(s)). No results found.  Assessment/Plan  1.  Dysphagia. 2.  GERD. 3.  Small Zenker's. 4.  EGD +/- dilatation. 5.  Risks (bleeding, infection, bowel perforation that could require surgery, sedation-related changes in cardiopulmonary systems), benefits (identification and possible treatment of source of symptoms, exclusion of certain causes of symptoms), and alternatives (watchful waiting, radiographic imaging studies, empiric medical treatment) of upper endoscopy with possible esophageal dilatation (EGD +/- DIL) were explained to patient/family in detail and patient wishes to proceed.  Landry Dyke 07/16/2020, 9:20 AM

## 2020-07-16 NOTE — Anesthesia Procedure Notes (Signed)
Procedure Name: MAC Date/Time: 07/16/2020 9:30 AM Performed by: Lollie Sails, CRNA Pre-anesthesia Checklist: Patient identified, Emergency Drugs available, Suction available, Patient being monitored and Timeout performed Oxygen Delivery Method: Nasal cannula

## 2020-07-16 NOTE — Discharge Instructions (Signed)

## 2020-07-16 NOTE — Anesthesia Preprocedure Evaluation (Signed)
Anesthesia Evaluation  Patient identified by MRN, date of birth, ID band Patient awake    Reviewed: Allergy & Precautions, NPO status , Patient's Chart, lab work & pertinent test results  Airway Mallampati: II  TM Distance: >3 FB Neck ROM: Full    Dental no notable dental hx.    Pulmonary neg pulmonary ROS, former smoker,    Pulmonary exam normal breath sounds clear to auscultation       Cardiovascular hypertension, Normal cardiovascular exam Rhythm:Regular Rate:Normal     Neuro/Psych negative neurological ROS  negative psych ROS   GI/Hepatic Neg liver ROS, GERD  ,  Endo/Other  negative endocrine ROS  Renal/GU negative Renal ROS  negative genitourinary   Musculoskeletal negative musculoskeletal ROS (+)   Abdominal   Peds negative pediatric ROS (+)  Hematology negative hematology ROS (+)   Anesthesia Other Findings   Reproductive/Obstetrics negative OB ROS                             Anesthesia Physical Anesthesia Plan  ASA: II  Anesthesia Plan: MAC   Post-op Pain Management:    Induction: Intravenous  PONV Risk Score and Plan: 2 and Propofol infusion and Treatment may vary due to age or medical condition  Airway Management Planned: Simple Face Mask  Additional Equipment:   Intra-op Plan:   Post-operative Plan:   Informed Consent: I have reviewed the patients History and Physical, chart, labs and discussed the procedure including the risks, benefits and alternatives for the proposed anesthesia with the patient or authorized representative who has indicated his/her understanding and acceptance.     Dental advisory given  Plan Discussed with: CRNA and Surgeon  Anesthesia Plan Comments:         Anesthesia Quick Evaluation

## 2020-07-16 NOTE — Anesthesia Postprocedure Evaluation (Signed)
Anesthesia Post Note  Patient: Dominique Griffith  Procedure(s) Performed: ESOPHAGOGASTRODUODENOSCOPY (EGD) WITH PROPOFOL (N/A )     Patient location during evaluation: PACU Anesthesia Type: MAC Level of consciousness: awake and alert Pain management: pain level controlled Vital Signs Assessment: post-procedure vital signs reviewed and stable Respiratory status: spontaneous breathing, nonlabored ventilation, respiratory function stable and patient connected to nasal cannula oxygen Cardiovascular status: stable and blood pressure returned to baseline Postop Assessment: no apparent nausea or vomiting Anesthetic complications: no   No complications documented.  Last Vitals:  Vitals:   07/16/20 0950 07/16/20 0955  BP: (!) 117/41   Pulse: (!) 55   Resp: 14   Temp:    SpO2: 100% 99%    Last Pain:  Vitals:   07/16/20 0950  TempSrc:   PainSc: 0-No pain                 Hershey Knauer S

## 2020-07-16 NOTE — Op Note (Signed)
[facility] Patient Name: Dominique Griffith Procedure Date: 07/16/2020 MRN: [id number] Attending MD: Arta Silence , MD Date of Birth: 1940/10/28 CSN: [id number] Age: 80 Admit Type: Outpatient Procedure:                Upper GI endoscopy Indications:              Dysphagia Providers:                Arta Silence, MD, Lesia Sago, Technician,                            Katina Degree, RN Referring MD:              Medicines:                Monitored Anesthesia Care Complications:            No immediate complications. Estimated Blood Loss:     Estimated blood loss: none. Procedure:                Pre-Anesthesia Assessment:                           - Prior to the procedure, a History and Physical                            was performed, and patient medications and                            allergies were reviewed. The patient's tolerance of                            previous anesthesia was also reviewed. The risks                            and benefits of the procedure and the sedation                            options and risks were discussed with the patient.                            All questions were answered, and informed consent                            was obtained. Prior Anticoagulants: The patient has                            taken no previous anticoagulant or antiplatelet                            agents except for aspirin. ASA Grade Assessment: II                            - A patient with mild systemic disease. After                            reviewing the  risks and benefits, the patient was                            deemed in satisfactory condition to undergo the                            procedure.                           After obtaining informed consent, the endoscope was                            passed under direct vision. Throughout the                            procedure, the patient's blood pressure, pulse, and                             oxygen saturations were monitored continuously. The                            GIF-H190 ([id number]) Olympus gastroscope was                            introduced through the mouth, and advanced to the                            second part of duodenum. The upper GI endoscopy was                            accomplished without difficulty. The patient                            tolerated the procedure well. Scope In: Scope Out: Findings:      A non-bleeding diverticulum with a small opening and no stigmata of       recent bleeding was found at the cricopharyngeus.      A small hiatal hernia was present.      The exam of the esophagus was otherwise normal. No esophageal stricture,       mass, or inflammatory changes were seen. No mucosal features of       eosinophilic esophagitis were identified.      The entire examined stomach was normal.      The duodenal bulb, first portion of the duodenum and second portion of       the duodenum were normal. 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                           - Small hiatal hernia.                           - Normal stomach.                           - Normal  duodenal bulb, first portion of the                            duodenum and second portion of the duodenum. Moderate Sedation:      None Recommendation:           - Patient has a contact number available for                            emergencies. The signs and symptoms of potential                            delayed complications were discussed with the                            patient. Return to normal activities tomorrow.                            Written discharge instructions were provided to the                            patient.                           - Discharge patient to home (via wheelchair).                           - Soft diet today.                           - Continue present medications.                           -  Return to GI clinic in 3 months.                           - If having ongoing problems with dysphagia, could                            consider esophageal manometry (mindful of small                            proximal Zenker's diverticulum, which is very small                            and I doubt would be problematic).                           - Return to referring physician as previously                            scheduled. 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 All rights reserved. The codes documented in this report are preliminary and upon coder review may  be revised to meet current compliance requirements. Arta Silence, MD 07/16/2020 10:01:37 AM This report has been signed electronically. Number of Addenda: 0

## 2020-07-16 NOTE — Transfer of Care (Signed)
Immediate Anesthesia Transfer of Care Note  Patient: Dominique Griffith  Procedure(s) Performed: ESOPHAGOGASTRODUODENOSCOPY (EGD) WITH PROPOFOL (N/A )  Patient Location: PACU and Endoscopy Unit  Anesthesia Type:MAC  Level of Consciousness: awake, alert  and oriented  Airway & Oxygen Therapy: Patient Spontanous Breathing and Patient connected to nasal cannula oxygen  Post-op Assessment: Report given to RN and Post -op Vital signs reviewed and stable  Post vital signs: Reviewed and stable  Last Vitals:  Vitals Value Taken Time  BP    Temp    Pulse    Resp    SpO2      Last Pain:  Vitals:   07/16/20 0828  TempSrc: Oral  PainSc: 0-No pain         Complications: No complications documented.

## 2020-07-17 ENCOUNTER — Encounter (HOSPITAL_COMMUNITY): Payer: Self-pay | Admitting: Gastroenterology

## 2020-09-17 ENCOUNTER — Other Ambulatory Visit: Payer: Self-pay

## 2020-09-17 ENCOUNTER — Encounter: Payer: Self-pay | Admitting: Podiatry

## 2020-09-17 ENCOUNTER — Ambulatory Visit: Payer: PPO | Admitting: Podiatry

## 2020-09-17 DIAGNOSIS — L84 Corns and callosities: Secondary | ICD-10-CM

## 2020-09-17 DIAGNOSIS — M19071 Primary osteoarthritis, right ankle and foot: Secondary | ICD-10-CM

## 2020-09-17 DIAGNOSIS — B351 Tinea unguium: Secondary | ICD-10-CM | POA: Diagnosis not present

## 2020-09-17 DIAGNOSIS — M109 Gout, unspecified: Secondary | ICD-10-CM | POA: Insufficient documentation

## 2020-09-17 DIAGNOSIS — M1000 Idiopathic gout, unspecified site: Secondary | ICD-10-CM

## 2020-09-20 NOTE — Progress Notes (Signed)
Subjective:   Patient ID: Dominique Griffith, female   DOB: 80 y.o.   MRN: [id number]   HPI 80 year old female presents the office today for concerns of intermittent discomfort most of the right big toe joint.  She states that she will occasionally get steroid injection to the joint appointment gout flares.  She states that she does not need an injection now but she has seen other providers in [place] and was hesitant to do the steroid injection.  She was told that she has arthritis.  Has other concerns of a callus to the left second toe.  She also gets some dry skin as well as fungal toenails.  No open sores.  No swelling or redness of the callus or toenail sites.  No recent treatment for this.  She has no other concerns today.   Review of Systems  All other systems reviewed and are negative.  Past Medical History:  Diagnosis Date   Anxiety    Complication of anesthesia    BACK SURGERY IN 2010; WAS PUT UNDER GENERAL ANESTHESIA ; REPORTS MEMORY LOSS AND CHANGE IN BEHAVIOR FOR 2 MONTHS POST-OP ; STILL OCCASIONALLY HAS TIMES SHE FORGETS THINGS    Depression    GERD (gastroesophageal reflux disease)    Hypertension    Syncope and collapse    6 MONTHS AGO HAD SYNCOPE AND COLLAPSE ; SAW CARDIOLOGIST;  HAD UNREMARKABLE ECHO ;     Past Surgical History:  Procedure Laterality Date   BACK SURGERY  2010   LUMBAR FUSION  WITH RODS IN PLACE   ESOPHAGOGASTRODUODENOSCOPY (EGD) WITH PROPOFOL N/A 07/16/2020   Procedure: ESOPHAGOGASTRODUODENOSCOPY (EGD) WITH PROPOFOL;  Surgeon: Arta Silence, MD;  Location: [facility];  Service: Endoscopy;  Laterality: N/A;   KNEE ARTHROPLASTY Right 04/22/2017   Procedure: RIGHT TOTAL KNEE ARTHROPLASTY WITH COMPUTER NAVIGATION;  Surgeon: Rod Can, MD;  Location: [facility] ORS;  Service: Orthopedics;  Laterality: Right;  Needs RNFA   KNEE ARTHROPLASTY Left 06/10/2017   Procedure: LEFT TOTAL KNEE ARTHROPLASTY WITH COMPUTER NAVIGATION;  Surgeon: Rod Can, MD;   Location: [facility];  Service: Orthopedics;  Laterality: Left;  Needs RNFA     Current Outpatient Medications:    aspirin EC 81 MG tablet, Take 81 mg by mouth daily. Swallow whole., Disp: , Rfl:    baclofen (LIORESAL) 10 MG tablet, Take 10 mg by mouth 2 (two) times daily with a meal. , Disp: , Rfl:    cetirizine (ZYRTEC) 10 MG tablet, Take 10 mg by mouth daily., Disp: , Rfl:    COD LIVER OIL PO, Take 750 mg by mouth daily., Disp: , Rfl:    diclofenac Sodium (VOLTAREN) 1 % GEL, Apply 1 application topically 4 (four) times daily as needed (pain)., Disp: , Rfl:    diphenhydrAMINE (BENADRYL) 25 mg capsule, Take 25 mg by mouth daily as needed for allergies., Disp: , Rfl:    escitalopram (LEXAPRO) 20 MG tablet, Take 20 mg by mouth daily., Disp: , Rfl:    famotidine (PEPCID) 20 MG tablet, Take 20 mg by mouth at bedtime as needed., Disp: , Rfl:    famotidine (PEPCID) 40 MG tablet, Take 40 mg by mouth 2 (two) times daily. , Disp: , Rfl:    losartan (COZAAR) 100 MG tablet, Take 100 mg by mouth daily., Disp: , Rfl:    Misc Natural Products (GLUCOSAMINE CHOND MSM FORMULA PO), Take 1 tablet by mouth daily., Disp: , Rfl:    Multiple Vitamins-Minerals (MULTIVITAMIN PO), Take 1 tablet by  mouth daily., Disp: , Rfl:    olopatadine (PATANOL) 0.1 % ophthalmic solution, Place 1 drop into both eyes 2 (two) times daily as needed for allergies., Disp: , Rfl:    pantoprazole (PROTONIX) 40 MG tablet, Take 40 mg by mouth daily., Disp: , Rfl:    Polyethyl Glycol-Propyl Glycol (SYSTANE) 0.4-0.3 % SOLN, Place 1 drop into both eyes daily as needed (dry eyes)., Disp: , Rfl:    TART CHERRY ULTRA PO, Take 1 capsule by mouth daily. With Turmeric, Disp: , Rfl:   Allergies  Allergen Reactions   Amoxicillin Rash    Has patient had a PCN reaction causing immediate rash, facial/tongue/throat swelling, SOB or lightheadedness with hypotension: No Has patient had a PCN reaction causing severe rash involving mucus membranes or skin  necrosis: No Has patient had a PCN reaction that required hospitalization: No Has patient had a PCN reaction occurring within the last 10 years: No If all of the above answers are "NO", then may proceed with Cephalosporin use.    Penicillins Rash and Other (See Comments)    Has patient had a PCN reaction causing immediate rash, facial/tongue/throat swelling, SOB or lightheadedness with hypotension: No Has patient had a PCN reaction causing severe rash involving mucus membranes or skin necrosis: No Has patient had a PCN reaction that required hospitalization: No Has patient had a PCN reaction occurring within the last 10 years: No If all of the above answers are "NO", then may proceed with Cephalosporin use.         Objective:  Physical Exam  General: AAO x3, NAD  Dermatological: Hyperkeratotic lesion on the medial aspect of the left second toe without any underlying ulceration drainage or signs of infection.  Dry skin is present with any skin fissures or open sores.  The nails are mildly hypertrophic, dystrophic and yellow discoloration.  No edema, erythema any signs of infection.  Vascular: Dorsalis Pedis artery and Posterior Tibial artery pedal pulses are 2/4 bilateral with immedate capillary fill time. There is no pain with calf compression, swelling, warmth, erythema.   Neruologic: Grossly intact via light touch bilateral.   Musculoskeletal: Decreased range of motion of the first MPJ.  Minimal edema to the MPJ.  There is minimal discomfort today.  No other areas of discomfort identified.  Muscular strength 5/5 in all groups tested bilateral.  Gait: Unassisted, Nonantalgic.       Assessment:   Right first imaging capsulitis, arthritis; onychomycosis; hyperkeratotic lesion     Plan:  -Treatment options discussed including all alternatives, risks, and complications -Etiology of symptoms were discussed -Patient wishes to hold off on further x-rays. -She states that she is not  a steroid injection now.  Discussed in the future if she needs to let me know.  Discussed that given the frequent gout flares discussed other medications to help with this but she states that normally she can handle it does not want proceed with any further medications. -Discussed shoe options for nail fungus but wants to hold off on any treatment both orally and topically. -Debrided hyperkeratotic lesion second toe with any complications including.  Offloading. -Moisturizer for the skin daily.  Return if symptoms worsen or fail to improve.  Trula Slade DPM

## 2020-10-21 DIAGNOSIS — B394 Histoplasmosis capsulati, unspecified: Secondary | ICD-10-CM | POA: Diagnosis not present

## 2020-10-21 DIAGNOSIS — H0102B Squamous blepharitis left eye, upper and lower eyelids: Secondary | ICD-10-CM | POA: Diagnosis not present

## 2020-10-21 DIAGNOSIS — Z961 Presence of intraocular lens: Secondary | ICD-10-CM | POA: Diagnosis not present

## 2020-10-21 DIAGNOSIS — H43811 Vitreous degeneration, right eye: Secondary | ICD-10-CM | POA: Diagnosis not present

## 2020-10-21 DIAGNOSIS — H0102A Squamous blepharitis right eye, upper and lower eyelids: Secondary | ICD-10-CM | POA: Diagnosis not present

## 2020-11-21 DIAGNOSIS — I10 Essential (primary) hypertension: Secondary | ICD-10-CM | POA: Diagnosis not present

## 2020-11-21 DIAGNOSIS — Z23 Encounter for immunization: Secondary | ICD-10-CM | POA: Diagnosis not present

## 2020-11-28 DIAGNOSIS — R61 Generalized hyperhidrosis: Secondary | ICD-10-CM | POA: Diagnosis not present

## 2020-12-24 ENCOUNTER — Ambulatory Visit: Payer: PPO | Admitting: Podiatry

## 2020-12-25 DIAGNOSIS — R112 Nausea with vomiting, unspecified: Secondary | ICD-10-CM | POA: Diagnosis not present

## 2020-12-25 DIAGNOSIS — Z03818 Encounter for observation for suspected exposure to other biological agents ruled out: Secondary | ICD-10-CM | POA: Diagnosis not present

## 2020-12-25 DIAGNOSIS — R509 Fever, unspecified: Secondary | ICD-10-CM | POA: Diagnosis not present

## 2020-12-31 ENCOUNTER — Ambulatory Visit: Payer: PPO

## 2020-12-31 ENCOUNTER — Encounter: Payer: Self-pay | Admitting: Podiatry

## 2020-12-31 ENCOUNTER — Other Ambulatory Visit: Payer: Self-pay

## 2020-12-31 ENCOUNTER — Ambulatory Visit (INDEPENDENT_AMBULATORY_CARE_PROVIDER_SITE_OTHER): Payer: PPO

## 2020-12-31 ENCOUNTER — Ambulatory Visit: Payer: PPO | Admitting: Podiatry

## 2020-12-31 DIAGNOSIS — M7751 Other enthesopathy of right foot: Secondary | ICD-10-CM

## 2020-12-31 DIAGNOSIS — K224 Dyskinesia of esophagus: Secondary | ICD-10-CM | POA: Insufficient documentation

## 2020-12-31 DIAGNOSIS — S99921A Unspecified injury of right foot, initial encounter: Secondary | ICD-10-CM | POA: Diagnosis not present

## 2020-12-31 DIAGNOSIS — F329 Major depressive disorder, single episode, unspecified: Secondary | ICD-10-CM | POA: Insufficient documentation

## 2020-12-31 DIAGNOSIS — D500 Iron deficiency anemia secondary to blood loss (chronic): Secondary | ICD-10-CM | POA: Insufficient documentation

## 2020-12-31 DIAGNOSIS — M2021 Hallux rigidus, right foot: Secondary | ICD-10-CM

## 2020-12-31 DIAGNOSIS — M8580 Other specified disorders of bone density and structure, unspecified site: Secondary | ICD-10-CM | POA: Insufficient documentation

## 2020-12-31 DIAGNOSIS — Z860100 Personal history of colon polyps, unspecified: Secondary | ICD-10-CM | POA: Insufficient documentation

## 2020-12-31 DIAGNOSIS — N281 Cyst of kidney, acquired: Secondary | ICD-10-CM | POA: Insufficient documentation

## 2020-12-31 DIAGNOSIS — N83202 Unspecified ovarian cyst, left side: Secondary | ICD-10-CM | POA: Insufficient documentation

## 2020-12-31 DIAGNOSIS — R270 Ataxia, unspecified: Secondary | ICD-10-CM | POA: Insufficient documentation

## 2020-12-31 DIAGNOSIS — N9489 Other specified conditions associated with female genital organs and menstrual cycle: Secondary | ICD-10-CM | POA: Insufficient documentation

## 2020-12-31 DIAGNOSIS — I10 Essential (primary) hypertension: Secondary | ICD-10-CM | POA: Insufficient documentation

## 2020-12-31 DIAGNOSIS — K5900 Constipation, unspecified: Secondary | ICD-10-CM | POA: Insufficient documentation

## 2020-12-31 DIAGNOSIS — M1120 Other chondrocalcinosis, unspecified site: Secondary | ICD-10-CM | POA: Insufficient documentation

## 2020-12-31 DIAGNOSIS — M1A00X0 Idiopathic chronic gout, unspecified site, without tophus (tophi): Secondary | ICD-10-CM | POA: Insufficient documentation

## 2020-12-31 DIAGNOSIS — E559 Vitamin D deficiency, unspecified: Secondary | ICD-10-CM | POA: Insufficient documentation

## 2020-12-31 DIAGNOSIS — F3342 Major depressive disorder, recurrent, in full remission: Secondary | ICD-10-CM | POA: Insufficient documentation

## 2020-12-31 DIAGNOSIS — J309 Allergic rhinitis, unspecified: Secondary | ICD-10-CM | POA: Insufficient documentation

## 2020-12-31 MED ORDER — DEXAMETHASONE SODIUM PHOSPHATE 120 MG/30ML IJ SOLN
4.0000 mg | Freq: Once | INTRAMUSCULAR | Status: AC
  Administered 2020-12-31: 4 mg via INTRA_ARTICULAR

## 2020-12-31 NOTE — Patient Instructions (Signed)

## 2021-01-03 ENCOUNTER — Telehealth: Payer: Self-pay | Admitting: *Deleted

## 2021-01-03 NOTE — Telephone Encounter (Signed)
-----   Message from Trula Slade, DPM sent at 01/03/2021  6:51 AM EST ----- Can you send the [employer] authorization for the surgical shoe?

## 2021-01-03 NOTE — Progress Notes (Signed)
Subjective: 80 year old female presents the office today with concerns to her right big toe.  She has had chronic arthritis and pain today.  But about a week ago she states that she got her foot caught and she bent her toe.  She had some swelling to the area on the big toe joint.  No other injuries or other areas of pain.  She is interested in a steroid injection today.  Its been quite sometime since she had one previously.  Objective: AAO x3, NAD DP/PT pulses palpable bilaterally, CRT less than 3 seconds Significant arthritic changes present in the right first MPJ.  There is localized edema but there is no erythema or warmth.  There is no tenderness to the remainder of the metatarsals.  Tenderness along the first MPJ. No edema, erythema, increase in warmth to bilateral lower extremities.  No open lesions or pre-ulcerative lesions.  No pain with calf compression, swelling, warmth, erythema  Assessment: Capsulitis right hallux, hallux rigidus  Plan: -All treatment options discussed with the patient including all alternatives, risks, complications.  -X-rays were obtained reviewed.  Significant arthritic changes present the first MPJ.  No evidence of acute fracture. -Patient was proceed with steroid injection.  Discussed risks of doing so she understands and wishes to proceed and verbal consent obtained.  Skin was prepped with Betadine, alcohol and mixture of 0.5 cc of dexamethasone phosphate, 0.5 cc of Marcaine plain was infiltrated on the first MPJ without complications.  Postinjection care discussed.  She tolerated the procedure well without any complications. -Surgical shoe dispensed and directed on use. -Patient encouraged to call the office with any questions, concerns, change in symptoms.   Trula Slade DPM

## 2021-01-03 NOTE — Telephone Encounter (Signed)
I sent the chart notes to [employer] today. Dominique Griffith

## 2021-01-06 ENCOUNTER — Telehealth: Payer: Self-pay | Admitting: Podiatry

## 2021-01-06 NOTE — Telephone Encounter (Signed)
Received [employer] auth # [id number] for walking TUUE([id number]) valid 11.8.2022 thru 2.6.2023

## 2021-01-10 ENCOUNTER — Other Ambulatory Visit: Payer: Self-pay

## 2021-01-10 ENCOUNTER — Ambulatory Visit: Payer: PPO | Admitting: Podiatry

## 2021-01-10 DIAGNOSIS — M7751 Other enthesopathy of right foot: Secondary | ICD-10-CM | POA: Diagnosis not present

## 2021-01-10 MED ORDER — TRIAMCINOLONE ACETONIDE 10 MG/ML IJ SUSP
10.0000 mg | Freq: Once | INTRAMUSCULAR | Status: AC
  Administered 2021-01-10: 10 mg

## 2021-01-13 NOTE — Progress Notes (Signed)
Subjective: 80 year old female presents the office today with concerns to her right big toe.  She states that the injection also did not help much.  She wears a surgical shoe it does not hurt.  No recent injury or changes otherwise since I last saw her.  She is given to go on vacation.  She is interested in the graphite inserts we discussed previously.  Objective: AAO x3, NAD DP/PT pulses palpable bilaterally, CRT less than 3 seconds Significant arthritic changes present in the right first MPJ.  There is tenderness more on the dorsal medial aspect of the foot has improved to the majority tenderness is on the dorsal lateral ankle area of prominent bone spur.  No significant edema there is no erythema or warmth associated with this.  No other areas of tenderness identified. No open lesions or pre-ulcerative lesions.  No pain with calf compression, swelling, warmth, erythema  Assessment: Capsulitis right hallux, hallux rigidus  Plan: -All treatment options discussed with the patient including all alternatives, risks, complications.  -Today I did perform another steroid injection but did this in a separate area on the lateral aspect of the joint on the first interspace of the majority of tenderness is localized today.  Skin was prepped with Betadine and mixture 1 cc Kenalog 10, 1 cc lidocaine plain was infiltrated into this area without complications.  Postinjection care was discussed.  Care was taken to avoid any tendon structures. -Continue with surgical shoe for now.  Dispensed a graphite insert to wear inside of her shoes.  Trula Slade DPM

## 2021-01-24 DIAGNOSIS — F3342 Major depressive disorder, recurrent, in full remission: Secondary | ICD-10-CM | POA: Diagnosis not present

## 2021-01-24 DIAGNOSIS — I10 Essential (primary) hypertension: Secondary | ICD-10-CM | POA: Diagnosis not present

## 2021-02-28 ENCOUNTER — Ambulatory Visit: Payer: PPO | Admitting: Podiatry

## 2021-03-13 DIAGNOSIS — Z780 Asymptomatic menopausal state: Secondary | ICD-10-CM | POA: Diagnosis not present

## 2021-03-13 DIAGNOSIS — M8589 Other specified disorders of bone density and structure, multiple sites: Secondary | ICD-10-CM | POA: Diagnosis not present

## 2021-03-13 DIAGNOSIS — Z1231 Encounter for screening mammogram for malignant neoplasm of breast: Secondary | ICD-10-CM | POA: Diagnosis not present

## 2021-03-18 ENCOUNTER — Ambulatory Visit: Payer: PPO | Admitting: Podiatry

## 2021-04-02 ENCOUNTER — Other Ambulatory Visit: Payer: Self-pay | Admitting: Physician Assistant

## 2021-04-02 DIAGNOSIS — K219 Gastro-esophageal reflux disease without esophagitis: Secondary | ICD-10-CM

## 2021-04-10 ENCOUNTER — Other Ambulatory Visit: Payer: Self-pay

## 2021-04-10 ENCOUNTER — Ambulatory Visit: Payer: PPO | Admitting: Podiatry

## 2021-04-10 DIAGNOSIS — M2021 Hallux rigidus, right foot: Secondary | ICD-10-CM | POA: Diagnosis not present

## 2021-04-10 DIAGNOSIS — M7751 Other enthesopathy of right foot: Secondary | ICD-10-CM

## 2021-04-13 NOTE — Progress Notes (Signed)
Subjective: 81 year old female presents the office today for follow-up evaluation of capsulitis, hallux rigidus on the right side.  She states that she is doing well and at max her pain level is 2/10.  After the last injection she did go on vacation she was able to walk without any issues.  She has another upcoming vacation scheduled in the next couple months and is asking if she can do an injection prior to that.  No recent injury or changes otherwise.  No other concerns.  Objective: AAO x3, NAD DP/PT pulses palpable bilaterally, CRT less than 3 seconds Decreased range of motion of the right first MPJ and dorsal spurring is present.  Majority discomfort on the dorsal lateral aspect of the first IPJ.  Minimal edema.  No other areas of discomfort.  MMT 5/5. No pain with calf compression, swelling, warmth, erythema  Assessment: Capsulitis, hallux rigidus right side  Plan: -All treatment options discussed with the patient including all alternatives, risks, complications.  -Overall doing better so we will hold off on injection today.  If needed we can do the injection prior to her next vacation.  Continue with supportive shoe gear. -Patient encouraged to call the office with any questions, concerns, change in symptoms.   Trula Slade DPM

## 2021-05-15 DIAGNOSIS — R61 Generalized hyperhidrosis: Secondary | ICD-10-CM | POA: Diagnosis not present

## 2021-07-07 ENCOUNTER — Ambulatory Visit: Payer: PPO | Admitting: Podiatry

## 2021-07-07 DIAGNOSIS — M7751 Other enthesopathy of right foot: Secondary | ICD-10-CM

## 2021-07-07 MED ORDER — TRIAMCINOLONE ACETONIDE 10 MG/ML IJ SUSP
10.0000 mg | Freq: Once | INTRAMUSCULAR | Status: AC
  Administered 2021-07-07: 10 mg

## 2021-07-09 NOTE — Progress Notes (Signed)
Subjective: ?81 year old female presents the office today for follow-up evaluation of capsulitis, hallux rigidus on the right side.  She has an upcoming vacation planned that she wants to proceed with a steroid injection today as the area is becoming painful.  No recent injuries that she reports but states that it did get worse after changing shoes recently.  She has no other concerns. ? ? ?Objective: ?AAO x3, NAD ?DP/PT pulses palpable bilaterally, CRT less than 3 seconds ?Decreased range of motion of the right first MPJ and dorsal spurring is present.  Tenderness noted on the first MPJ both dorsally as well as medially.  Slight edema.  No erythema or warmth. ?No pain with calf compression, swelling, warmth, erythema ? ?Assessment: ?Capsulitis, hallux rigidus right side ? ?Plan: ?-All treatment options discussed with the patient including all alternatives, risks, complications.  ?-Steroid injection performed today.  Skin was cleaned Betadine and a mixture 1 cc Kenalog 10, 0.5 cc of Marcaine plain, 0.5 cc of lidocaine plain was infiltrated into and around the first MPJ without complications.  Postinjection care discussed.  Tolerated the procedure well. ?-Continue with supportive shoe gear. ? ?Trula Slade DPM ? ? ?

## 2021-09-05 DIAGNOSIS — I10 Essential (primary) hypertension: Secondary | ICD-10-CM | POA: Diagnosis not present

## 2021-09-05 DIAGNOSIS — Z1211 Encounter for screening for malignant neoplasm of colon: Secondary | ICD-10-CM | POA: Diagnosis not present

## 2021-09-05 DIAGNOSIS — Z0000 Encounter for general adult medical examination without abnormal findings: Secondary | ICD-10-CM | POA: Diagnosis not present

## 2021-09-05 DIAGNOSIS — K224 Dyskinesia of esophagus: Secondary | ICD-10-CM | POA: Diagnosis not present

## 2021-09-05 DIAGNOSIS — I358 Other nonrheumatic aortic valve disorders: Secondary | ICD-10-CM | POA: Diagnosis not present

## 2021-09-05 DIAGNOSIS — R5383 Other fatigue: Secondary | ICD-10-CM | POA: Diagnosis not present

## 2021-09-05 DIAGNOSIS — F322 Major depressive disorder, single episode, severe without psychotic features: Secondary | ICD-10-CM | POA: Diagnosis not present

## 2021-09-05 DIAGNOSIS — M8588 Other specified disorders of bone density and structure, other site: Secondary | ICD-10-CM | POA: Diagnosis not present

## 2021-09-10 DIAGNOSIS — I358 Other nonrheumatic aortic valve disorders: Secondary | ICD-10-CM | POA: Diagnosis not present

## 2021-10-16 DIAGNOSIS — I10 Essential (primary) hypertension: Secondary | ICD-10-CM | POA: Diagnosis not present

## 2021-10-22 DIAGNOSIS — H04123 Dry eye syndrome of bilateral lacrimal glands: Secondary | ICD-10-CM | POA: Diagnosis not present

## 2021-10-22 DIAGNOSIS — Z961 Presence of intraocular lens: Secondary | ICD-10-CM | POA: Diagnosis not present

## 2021-10-22 DIAGNOSIS — H0102A Squamous blepharitis right eye, upper and lower eyelids: Secondary | ICD-10-CM | POA: Diagnosis not present

## 2021-10-22 DIAGNOSIS — H0102B Squamous blepharitis left eye, upper and lower eyelids: Secondary | ICD-10-CM | POA: Diagnosis not present

## 2021-10-22 DIAGNOSIS — B394 Histoplasmosis capsulati, unspecified: Secondary | ICD-10-CM | POA: Diagnosis not present

## 2021-10-22 DIAGNOSIS — H43811 Vitreous degeneration, right eye: Secondary | ICD-10-CM | POA: Diagnosis not present

## 2021-11-25 DIAGNOSIS — R052 Subacute cough: Secondary | ICD-10-CM | POA: Diagnosis not present

## 2021-11-25 DIAGNOSIS — J3089 Other allergic rhinitis: Secondary | ICD-10-CM | POA: Diagnosis not present

## 2022-01-21 DIAGNOSIS — M545 Low back pain, unspecified: Secondary | ICD-10-CM | POA: Diagnosis not present

## 2022-02-04 DIAGNOSIS — M9903 Segmental and somatic dysfunction of lumbar region: Secondary | ICD-10-CM | POA: Diagnosis not present

## 2022-02-04 DIAGNOSIS — M9904 Segmental and somatic dysfunction of sacral region: Secondary | ICD-10-CM | POA: Diagnosis not present

## 2022-02-04 DIAGNOSIS — M9905 Segmental and somatic dysfunction of pelvic region: Secondary | ICD-10-CM | POA: Diagnosis not present

## 2022-02-04 DIAGNOSIS — M5136 Other intervertebral disc degeneration, lumbar region: Secondary | ICD-10-CM | POA: Diagnosis not present

## 2022-02-05 DIAGNOSIS — M5136 Other intervertebral disc degeneration, lumbar region: Secondary | ICD-10-CM | POA: Diagnosis not present

## 2022-02-05 DIAGNOSIS — M9905 Segmental and somatic dysfunction of pelvic region: Secondary | ICD-10-CM | POA: Diagnosis not present

## 2022-02-05 DIAGNOSIS — M9903 Segmental and somatic dysfunction of lumbar region: Secondary | ICD-10-CM | POA: Diagnosis not present

## 2022-02-05 DIAGNOSIS — M9904 Segmental and somatic dysfunction of sacral region: Secondary | ICD-10-CM | POA: Diagnosis not present

## 2022-02-08 DIAGNOSIS — R5383 Other fatigue: Secondary | ICD-10-CM | POA: Diagnosis not present

## 2022-02-08 DIAGNOSIS — R509 Fever, unspecified: Secondary | ICD-10-CM | POA: Diagnosis not present

## 2022-02-08 DIAGNOSIS — Z03818 Encounter for observation for suspected exposure to other biological agents ruled out: Secondary | ICD-10-CM | POA: Diagnosis not present

## 2022-02-08 DIAGNOSIS — J029 Acute pharyngitis, unspecified: Secondary | ICD-10-CM | POA: Diagnosis not present

## 2022-02-08 DIAGNOSIS — R051 Acute cough: Secondary | ICD-10-CM | POA: Diagnosis not present

## 2022-03-19 ENCOUNTER — Ambulatory Visit: Payer: PPO | Admitting: Podiatry

## 2022-03-19 DIAGNOSIS — M2021 Hallux rigidus, right foot: Secondary | ICD-10-CM

## 2022-03-19 DIAGNOSIS — Z1231 Encounter for screening mammogram for malignant neoplasm of breast: Secondary | ICD-10-CM | POA: Diagnosis not present

## 2022-03-19 DIAGNOSIS — M7751 Other enthesopathy of right foot: Secondary | ICD-10-CM | POA: Diagnosis not present

## 2022-03-19 MED ORDER — TRIAMCINOLONE ACETONIDE 10 MG/ML IJ SUSP
10.0000 mg | Freq: Once | INTRAMUSCULAR | Status: AC
  Administered 2022-03-19: 10 mg

## 2022-03-19 NOTE — Patient Instructions (Signed)

## 2022-03-22 NOTE — Progress Notes (Signed)
Subjective: Chief Complaint  Patient presents with   Arthritis    Patient here for injection, right foot    82 year old female presents the office today for follow-up evaluation of capsulitis, hallux rigidus on the right side.  She is requesting another injection today.  No recent injury or changes.  She has no other concerns today.    Objective: AAO x3, NAD DP/PT pulses palpable bilaterally, CRT less than 3 seconds Decreased range of motion of the right first MPJ and dorsal spurring is present.  Tenderness noted on the first MPJ encompassing the joint.  There is some edema present there is no erythema or warmth.  Crepitation with range of motion. No pain with calf compression, swelling, warmth, erythema  Assessment: Capsulitis, hallux rigidus right side  Plan: -All treatment options discussed with the patient including all alternatives, risks, complications.  -We discussed the conservative as well as surgical intervention.  We briefly discussed the first MPJ arthrodesis as well as the recovery on this she will proceed with this. -Steroid injection performed today.  Skin was cleaned Betadine and a mixture 1 cc Kenalog 10, 0.5 cc of Marcaine plain, 0.5 cc of lidocaine plain was infiltrated into and around the first MPJ without complications.  Postinjection care discussed.  Tolerated the procedure well. -Continue with supportive shoe gear and shoes with a stiffer sole.  Trula Slade DPM

## 2022-03-27 ENCOUNTER — Other Ambulatory Visit: Payer: Self-pay

## 2022-03-30 ENCOUNTER — Other Ambulatory Visit: Payer: Self-pay

## 2022-03-30 MED ORDER — LOSARTAN POTASSIUM 100 MG PO TABS
100.0000 mg | ORAL_TABLET | Freq: Every day | ORAL | 3 refills | Status: DC
  Filled 2022-03-30: qty 90, 90d supply, fill #0

## 2022-03-30 MED ORDER — BACLOFEN 10 MG PO TABS
10.0000 mg | ORAL_TABLET | Freq: Three times a day (TID) | ORAL | 3 refills | Status: DC
  Filled 2022-03-30: qty 270, 90d supply, fill #0

## 2022-03-30 MED ORDER — GLYCOPYRROLATE 1 MG PO TABS
ORAL_TABLET | ORAL | 2 refills | Status: DC
  Filled 2022-03-30: qty 60, 30d supply, fill #0

## 2022-03-30 MED ORDER — ESCITALOPRAM OXALATE 20 MG PO TABS
20.0000 mg | ORAL_TABLET | Freq: Every morning | ORAL | 3 refills | Status: DC
  Filled 2022-03-30: qty 90, 90d supply, fill #0

## 2022-03-30 MED ORDER — MELOXICAM 15 MG PO TABS
15.0000 mg | ORAL_TABLET | Freq: Every day | ORAL | 3 refills | Status: DC
  Filled 2022-03-30 (×2): qty 30, 30d supply, fill #0
  Filled 2022-08-20: qty 30, 30d supply, fill #1

## 2022-03-30 MED ORDER — FAMOTIDINE 40 MG PO TABS
40.0000 mg | ORAL_TABLET | Freq: Every evening | ORAL | 3 refills | Status: DC
  Filled 2022-03-30: qty 90, 90d supply, fill #0
  Filled 2022-06-24: qty 90, 90d supply, fill #1
  Filled 2022-09-25: qty 14, 14d supply, fill #2

## 2022-03-31 ENCOUNTER — Other Ambulatory Visit: Payer: Self-pay

## 2022-04-01 ENCOUNTER — Other Ambulatory Visit: Payer: Self-pay

## 2022-04-02 ENCOUNTER — Other Ambulatory Visit: Payer: Self-pay

## 2022-04-03 ENCOUNTER — Other Ambulatory Visit: Payer: Self-pay

## 2022-04-08 ENCOUNTER — Other Ambulatory Visit: Payer: Self-pay

## 2022-04-09 ENCOUNTER — Other Ambulatory Visit: Payer: Self-pay

## 2022-04-13 ENCOUNTER — Other Ambulatory Visit: Payer: Self-pay

## 2022-04-17 ENCOUNTER — Other Ambulatory Visit: Payer: Self-pay

## 2022-04-21 ENCOUNTER — Other Ambulatory Visit: Payer: Self-pay

## 2022-04-22 ENCOUNTER — Other Ambulatory Visit: Payer: Self-pay

## 2022-05-25 ENCOUNTER — Other Ambulatory Visit: Payer: Self-pay

## 2022-05-25 MED ORDER — AMLODIPINE BESYLATE 5 MG PO TABS
2.5000 mg | ORAL_TABLET | Freq: Every day | ORAL | 1 refills | Status: DC
  Filled 2022-05-25: qty 45, 90d supply, fill #0
  Filled 2022-06-24 – 2022-08-20 (×2): qty 45, 90d supply, fill #1

## 2022-05-27 ENCOUNTER — Other Ambulatory Visit: Payer: Self-pay

## 2022-06-24 ENCOUNTER — Other Ambulatory Visit: Payer: Self-pay

## 2022-06-24 MED ORDER — LOSARTAN POTASSIUM 100 MG PO TABS
100.0000 mg | ORAL_TABLET | Freq: Every day | ORAL | 1 refills | Status: DC
  Filled 2022-06-24: qty 90, 90d supply, fill #0
  Filled 2022-09-25: qty 90, 90d supply, fill #1

## 2022-06-25 ENCOUNTER — Other Ambulatory Visit: Payer: Self-pay

## 2022-06-25 MED ORDER — ESCITALOPRAM OXALATE 20 MG PO TABS
20.0000 mg | ORAL_TABLET | Freq: Every day | ORAL | 1 refills | Status: DC
  Filled 2022-06-25: qty 90, 90d supply, fill #0
  Filled 2022-09-25: qty 90, 90d supply, fill #1

## 2022-06-30 ENCOUNTER — Other Ambulatory Visit: Payer: Self-pay

## 2022-07-21 DIAGNOSIS — R0600 Dyspnea, unspecified: Secondary | ICD-10-CM | POA: Diagnosis not present

## 2022-07-21 DIAGNOSIS — I499 Cardiac arrhythmia, unspecified: Secondary | ICD-10-CM | POA: Diagnosis not present

## 2022-07-28 DIAGNOSIS — F3342 Major depressive disorder, recurrent, in full remission: Secondary | ICD-10-CM | POA: Diagnosis not present

## 2022-07-28 DIAGNOSIS — R0600 Dyspnea, unspecified: Secondary | ICD-10-CM | POA: Diagnosis not present

## 2022-08-03 ENCOUNTER — Telehealth: Payer: Self-pay | Admitting: Cardiovascular Disease

## 2022-08-03 ENCOUNTER — Other Ambulatory Visit: Payer: Self-pay

## 2022-08-03 DIAGNOSIS — R0600 Dyspnea, unspecified: Secondary | ICD-10-CM | POA: Diagnosis not present

## 2022-08-03 MED ORDER — ELIQUIS 5 MG PO TABS
5.0000 mg | ORAL_TABLET | Freq: Two times a day (BID) | ORAL | 3 refills | Status: DC
  Filled 2022-08-03: qty 60, 30d supply, fill #0
  Filled 2022-08-20 – 2022-09-01 (×2): qty 60, 30d supply, fill #1
  Filled 2022-09-25: qty 60, 30d supply, fill #2
  Filled 2022-10-28: qty 60, 30d supply, fill #3

## 2022-08-03 NOTE — Telephone Encounter (Signed)
Left message with our fax number. Advised pt to call back with questions or concerns.

## 2022-08-03 NOTE — Telephone Encounter (Signed)
Patient wanted to report to Dr. Royann Shivers that she has hx of afib and flutter with 5 second pauses.  Dr. Nehemiah Settle (PCP) will have heart monitor results.

## 2022-08-03 NOTE — Telephone Encounter (Signed)
Patient returned call. She states she plans to contacts Dr. Idelle Crouch office to request to have her records sent.

## 2022-08-03 NOTE — Telephone Encounter (Signed)
Left message for pt to call our office back. Advised pt that it would be helpful to have records from Dr. Idelle Crouch office. Left number for pt to call back.

## 2022-08-04 ENCOUNTER — Other Ambulatory Visit: Payer: Self-pay

## 2022-08-20 ENCOUNTER — Other Ambulatory Visit: Payer: Self-pay

## 2022-08-24 ENCOUNTER — Other Ambulatory Visit: Payer: Self-pay

## 2022-09-01 ENCOUNTER — Other Ambulatory Visit: Payer: Self-pay

## 2022-09-02 ENCOUNTER — Other Ambulatory Visit: Payer: Self-pay

## 2022-09-17 ENCOUNTER — Other Ambulatory Visit: Payer: Self-pay

## 2022-09-25 ENCOUNTER — Ambulatory Visit: Payer: PPO | Attending: Cardiovascular Disease | Admitting: Cardiovascular Disease

## 2022-09-25 ENCOUNTER — Encounter: Payer: Self-pay | Admitting: Cardiovascular Disease

## 2022-09-25 ENCOUNTER — Other Ambulatory Visit: Payer: Self-pay

## 2022-09-25 VITALS — BP 133/82 | HR 58 | Ht 63.0 in | Wt 174.2 lb

## 2022-09-25 DIAGNOSIS — I10 Essential (primary) hypertension: Secondary | ICD-10-CM

## 2022-09-25 DIAGNOSIS — Z01812 Encounter for preprocedural laboratory examination: Secondary | ICD-10-CM | POA: Diagnosis not present

## 2022-09-25 DIAGNOSIS — I483 Typical atrial flutter: Secondary | ICD-10-CM | POA: Diagnosis not present

## 2022-09-25 DIAGNOSIS — R0602 Shortness of breath: Secondary | ICD-10-CM | POA: Diagnosis not present

## 2022-09-25 DIAGNOSIS — I495 Sick sinus syndrome: Secondary | ICD-10-CM

## 2022-09-25 DIAGNOSIS — D6869 Other thrombophilia: Secondary | ICD-10-CM | POA: Diagnosis not present

## 2022-09-25 DIAGNOSIS — R079 Chest pain, unspecified: Secondary | ICD-10-CM

## 2022-09-25 DIAGNOSIS — E785 Hyperlipidemia, unspecified: Secondary | ICD-10-CM

## 2022-09-25 DIAGNOSIS — R0989 Other specified symptoms and signs involving the circulatory and respiratory systems: Secondary | ICD-10-CM | POA: Diagnosis not present

## 2022-09-25 MED ORDER — METOPROLOL TARTRATE 25 MG PO TABS
25.0000 mg | ORAL_TABLET | Freq: Once | ORAL | 0 refills | Status: DC
  Filled 2022-09-25: qty 1, 1d supply, fill #0

## 2022-09-25 NOTE — Progress Notes (Signed)
Faxed Dr Renford Dills- requesting Monitor and Echocardiogram results.

## 2022-09-25 NOTE — Patient Instructions (Addendum)
Medication Instructions:  Metoprolol Tartrate 25 mg 2 hours prior to CT *If you need a refill on your cardiac medications before your next appointment, please call your pharmacy*   Lab Work: BMP - today If you have labs (blood work) drawn today and your tests are completely normal, you will receive your results only by: MyChart Message (if you have MyChart) OR A paper copy in the mail If you have any lab test that is abnormal or we need to change your treatment, we will call you to review the results.   Testing/Procedures: Your physician has requested that you have a carotid duplex. This test is an ultrasound of the carotid arteries in your neck. It looks at blood flow through these arteries that supply the brain with blood. Allow one hour for this exam. There are no restrictions or special instructions.     Your cardiac CT will be scheduled at one of the below locations:   [facility] [address] [place], [place] [address] [phone]   If scheduled at [facility], please arrive at the [facility] and Children's Entrance (Entrance C2) of [facility] 30 minutes prior to test start time. You can use the FREE valet parking offered at entrance C (encouraged to control the heart rate for the test)  Proceed to the [facility] Radiology Department (first floor) to check-in and test prep.  All radiology patients and guests should use entrance C2 at [facility], accessed from [facility], even though the hospital's physical address listed is [address].     Please follow these instructions carefully (unless otherwise directed):  An IV will be required for this test and Nitroglycerin will be given.   On the Night Before the Test: Be sure to Drink plenty of water. Do not consume any caffeinated/decaffeinated beverages or chocolate 12 hours prior to your test. Do not take any antihistamines 12 hours prior to your test.  On  the Day of the Test: Drink plenty of water until 1 hour prior to the test. Do not eat any food 1 hour prior to test. You may take your regular medications prior to the test.  Take metoprolol (Lopressor) two hours prior to test. If you take Furosemide/Hydrochlorothiazide/Spironolactone, please HOLD on the morning of the test. FEMALES- please wear underwire-free bra if available, avoid dresses & tight clothing  After the Test: Drink plenty of water. After receiving IV contrast, you may experience a mild flushed feeling. This is normal. On occasion, you may experience a mild rash up to 24 hours after the test. This is not dangerous. If this occurs, you can take Benadryl 25 mg and increase your fluid intake. If you experience trouble breathing, this can be serious. If it is severe call 911 IMMEDIATELY. If it is mild, please call our office. If you take any of these medications: Glipizide/Metformin, Avandament, Glucavance, please do not take 48 hours after completing test unless otherwise instructed.  We will call to schedule your test 2-4 weeks out understanding that some insurance companies will need an authorization prior to the service being performed.   For more information and frequently asked questions, please visit our website : [email or web address]  For non-scheduling related questions, please contact the cardiac imaging nurse navigator should you have any questions/concerns: Cardiac Imaging Nurse Navigators Direct Office Dial: [phone]   For scheduling needs, including cancellations and rescheduling, please call [place], [phone].    Follow-Up: At [facility], you and your  health needs are our priority.  As part of our continuing mission to provide you with exceptional heart care, we have created designated Provider Care Teams.  These Care Teams include your primary Cardiologist (physician) and Advanced Practice Providers (APPs -  Physician Assistants  and Nurse Practitioners) who all work together to provide you with the care you need, when you need it.  We recommend signing up for the patient portal called "MyChart".  Sign up information is provided on this After Visit Summary.  MyChart is used to connect with patients for Virtual Visits (Telemedicine).  Patients are able to view lab/test results, encounter notes, upcoming appointments, etc.  Non-urgent messages can be sent to your provider as well.   To learn more about what you can do with MyChart, go to [email or web address].    Your next appointment:    3-4 months (first available)  Provider:   Dr Royann Shivers

## 2022-09-25 NOTE — Progress Notes (Unsigned)
Cardiology Office Note:  .   Date:  09/26/2022  ID:  Dominique Griffith, DOB [birth year], MRN [id number] PCP: Renford Dills, MD  [facility] Providers Cardiologist:  None    History of Present Illness: .   Dominique Griffith is a 82 y.o. female with hypertension, history of arthritis and pseudogout, history of depression referred in consultation by Dr. Nehemiah Settle for atrial fibrillation/atrial flutter.  When I saw Dominique Griffith in the office, we did not have any documentation of the arrhythmia yet, later in the day we received the copy of her event monitor.  This showed several episodes of paroxysmal atrial flutter generally with borderline controlled ventricular rate (100-110s), followed by postconversion pauses longest 5 seconds in duration.  She has already been started on Eliquis anticoagulation since 08/03/2022.  She also reports having had an echocardiogram performed at [facility], but I was not yet able to get the results of that report.  For the last approximately 6 months she has had exertional dyspnea, after about a block.  This is a substantial change from last year.  She does not have dyspnea with activities around the house.  Occasionally, she will have mid chest pressure.  This can occur while she is walking, but has also occurred at rest.  It radiates to her lower jaw.  It last for few minutes and resolve spontaneously.  She has also had palpitations, but there is no clear correlation between the palpitations and the dyspnea or chest pressure complaints.  Occasionally she feels "woozy" when she has palpitations, but she has not experienced syncope.  She frequently has mild ankle swelling.  She denies worsening lower extremity edema, claudication, orthopnea, PND.  She quit smoking 40 years ago and has a roughly 14-pack-year history of smoking.  She has hypertension that has been generally well treated.  She does not have diabetes mellitus.  There is no strong family history of early onset  vascular disease, although her father had his first myocardial infarction in his 1s and died of a heart attack at age 87.  Her mother had a stroke in her mid 89s.  In 2018 she had an echocardiogram and carotid duplex ultrasound performed for "syncope".  Both studies were unremarkable, although there was some carotid plaque.  The details of the syncopal event are unclear.  ROS: Occasional GERD symptoms.  Studies Reviewed: Marland Kitchen   EKG Interpretation Date/Time:  Friday September 25 2022 13:09:02 EDT Ventricular Rate:  58 PR Interval:  184 QRS Duration:  70 QT Interval:  438 QTC Calculation: 429 R Axis:   58  Text Interpretation: Sinus bradycardia When compared with ECG of 15-Oct-2016 16:34, No significant change was found Confirmed by Allayah Raineri ([id number]) on 09/25/2022 1:22:40 PM    Echocardiogram 2018  - Left ventricle: The cavity size was normal. There was mild    concentric hypertrophy. Systolic function was vigorous. The    estimated ejection fraction was in the range of 65% to 70%. Wall    motion was normal; there were no regional wall motion    abnormalities. Doppler parameters are consistent with abnormal    left ventricular relaxation (grade 1 diastolic dysfunction).    Doppler parameters are consistent with high ventricular filling    pressure.  - Aortic valve: Transvalvular velocity was within the normal range.    There was no stenosis. There was mild regurgitation.  - Mitral valve: Transvalvular velocity was within the normal range.    There was no evidence for stenosis.  There was trivial    regurgitation.  - Right ventricle: The cavity size was normal. Wall thickness was    normal. Systolic function was normal.   Carotid duplex ultrasound 2018:   RIGHT CAROTID ARTERY: Minimal calcified plaque in the bulb. Low resistance internal carotid Doppler pattern.   RIGHT VERTEBRAL ARTERY:  Antegrade.   LEFT CAROTID ARTERY: There is moderate calcified plaque in the bulb. Low  resistance internal carotid Doppler pattern is preserved.   LEFT VERTEBRAL ARTERY:  Antegrade.   IMPRESSION: Less than 50% stenosis in the right and left internal carotid arteries. Risk Assessment/Calculations:    CHA2DS2-VASc Score =   4  This indicates a  % annual risk of stroke. The patient's score is based upon:              Physical Exam:   VS:  BP 133/82   Pulse (!) 58   Ht 5\' 3"  (1.6 m)   Wt 174 lb 3.2 oz (79 kg)   SpO2 94%   BMI 30.86 kg/m    Wt Readings from Last 3 Encounters:  09/25/22 174 lb 3.2 oz (79 kg)  07/12/20 160 lb (72.6 kg)  11/01/19 166 lb 7.2 oz (75.5 kg)    GEN: Well nourished, well developed in no acute distress.  Borderline obese NECK: No JVD; bilateral carotid bruits, R>>L CARDIAC: RRR, 1-2/6 aortic ejection murmur, no diastolic murmurs, rubs, gallops RESPIRATORY:  Clear to auscultation without rales, wheezing or rhonchi  ABDOMEN: Soft, non-tender, non-distended EXTREMITIES:  No edema; No deformity   ASSESSMENT AND PLAN: .   Atrial flutter: I think all of the tracing showed atrial flutter, although due to artifact I cannot be sure that there is not some atrial fibrillation.  There is variable AV block and generally good rate control despite the fact that she is taking a tiny dose of beta-blocker.  Suggested that may be some underlying AV node conduction abnormalities that are age-related.  She has been started on Eliquis and this is appropriate. Tachycardia-bradycardia syndrome: Significant postconversion pauses up to 5 seconds in duration.  This probably explains her "woozy" episodes.  1 option is to go ahead with pacemaker implantation, but will refer to electrophysiology.  Maybe if we can ablate her atrial flutter she will not need a pacemaker, or at least not yet.  I did not have a chance to discuss this with Dominique Griffith in the office since I did not receive the event monitor until after she left our office.  I left her message telling her that I  made the referral to EP. Anticoagulation: Appropriate for CHA2DS2-VASc score of 4 (age 6, gender, HTN).  Has not had any bleeding problems.  No history of stroke/TIA or other embolic events. Exertional dyspnea/chest pressure: This is unlikely to be explained by her arrhythmia.  Have recommended coronary CT angiography.  She has several risk factors for coronary artery disease including age, remote history of smoking, HTN.  She had evidence of atherosclerosis in her carotid arteries on ultrasound that was performed 6 years ago. Carotid bruits: Recheck duplex ultrasound for evidence of disease progression. HLP: Lipid profile is not bad and she has an exceptionally high HDL cholesterol.  However if there is evidence of carotid disease progression or she has coronary stenosis/high calcium score would recommend lipid-lowering therapy to achieve a target LDL less than 70. HTN: Adequate control on a combination of amlodipine, losartan, low-dose metoprolol.       Dispo: Refer to EP for Aflutter  ablation and /or pacemaker implantation. Check coronary CT angio and carotid duplex US.  Signed, Thurmon Fair, MD

## 2022-09-26 ENCOUNTER — Encounter: Payer: Self-pay | Admitting: Cardiovascular Disease

## 2022-09-26 ENCOUNTER — Other Ambulatory Visit: Payer: Self-pay | Admitting: Cardiovascular Disease

## 2022-09-28 ENCOUNTER — Other Ambulatory Visit: Payer: Self-pay

## 2022-09-29 ENCOUNTER — Other Ambulatory Visit: Payer: Self-pay

## 2022-10-01 ENCOUNTER — Ambulatory Visit (HOSPITAL_COMMUNITY)
Admission: RE | Admit: 2022-10-01 | Discharge: 2022-10-01 | Disposition: A | Discharge status: Home / Self Care | Payer: PPO | Source: Ambulatory Visit | Attending: Cardiovascular Disease | Admitting: Cardiovascular Disease

## 2022-10-01 DIAGNOSIS — I700 Atherosclerosis of aorta: Secondary | ICD-10-CM | POA: Diagnosis not present

## 2022-10-01 DIAGNOSIS — R0602 Shortness of breath: Secondary | ICD-10-CM | POA: Insufficient documentation

## 2022-10-01 DIAGNOSIS — R079 Chest pain, unspecified: Secondary | ICD-10-CM | POA: Insufficient documentation

## 2022-10-01 DIAGNOSIS — I2510 Atherosclerotic heart disease of native coronary artery without angina pectoris: Secondary | ICD-10-CM

## 2022-10-01 MED ORDER — NITROGLYCERIN 0.4 MG SL SUBL
0.8000 mg | SUBLINGUAL_TABLET | SUBLINGUAL | Status: DC | PRN
  Administered 2022-10-01: 0.8 mg via SUBLINGUAL

## 2022-10-01 MED ORDER — NITROGLYCERIN 0.4 MG SL SUBL
SUBLINGUAL_TABLET | SUBLINGUAL | Status: AC
  Filled 2022-10-01: qty 2

## 2022-10-01 MED ORDER — IOHEXOL 350 MG/ML SOLN
95.0000 mL | Freq: Once | INTRAVENOUS | Status: AC | PRN
  Administered 2022-10-01: 95 mL via INTRAVENOUS

## 2022-10-02 ENCOUNTER — Encounter: Payer: Self-pay | Admitting: Cardiovascular Disease

## 2022-10-02 DIAGNOSIS — I483 Typical atrial flutter: Secondary | ICD-10-CM

## 2022-10-02 NOTE — Telephone Encounter (Signed)
Yes - good news on the plumbing, need to see the electrician. Please make sure the EP referral is in

## 2022-10-05 ENCOUNTER — Other Ambulatory Visit: Payer: Self-pay

## 2022-10-05 MED ORDER — AMLODIPINE BESYLATE 2.5 MG PO TABS
2.5000 mg | ORAL_TABLET | Freq: Every day | ORAL | 0 refills | Status: DC
  Filled 2022-10-05: qty 90, 90d supply, fill #0

## 2022-10-07 ENCOUNTER — Other Ambulatory Visit: Payer: Self-pay

## 2022-10-08 ENCOUNTER — Ambulatory Visit (HOSPITAL_COMMUNITY)
Admission: RE | Admit: 2022-10-08 | Discharge: 2022-10-08 | Disposition: A | Discharge status: Home / Self Care | Payer: PPO | Source: Ambulatory Visit | Attending: Cardiology | Admitting: Cardiology

## 2022-10-08 DIAGNOSIS — R0989 Other specified symptoms and signs involving the circulatory and respiratory systems: Secondary | ICD-10-CM | POA: Insufficient documentation

## 2022-10-16 ENCOUNTER — Telehealth: Payer: Self-pay | Admitting: Emergency Medicine

## 2022-10-16 DIAGNOSIS — R911 Solitary pulmonary nodule: Secondary | ICD-10-CM

## 2022-10-16 NOTE — Telephone Encounter (Signed)
Croitoru, Mihai, MD  Scheryl Marten, RN The radiologist to over read the lung part of the chest CT saw a very small nodule in the bottom of the left lung measuring about 8 mm.  It is recommended to get another CT in 6-12 months (this can be done without contrast).  We can schedule that CT, if the patient wishes.  Called and went over the information above with the patient. Told her that this is an incidental finding. She would like for [place] to place the order. Told her I would place the order and she would receive a call to scheduled (when it gets closer to time). She verbalized understanding

## 2022-10-23 DIAGNOSIS — H0102B Squamous blepharitis left eye, upper and lower eyelids: Secondary | ICD-10-CM | POA: Diagnosis not present

## 2022-10-23 DIAGNOSIS — H43811 Vitreous degeneration, right eye: Secondary | ICD-10-CM | POA: Diagnosis not present

## 2022-10-23 DIAGNOSIS — B394 Histoplasmosis capsulati, unspecified: Secondary | ICD-10-CM | POA: Diagnosis not present

## 2022-10-23 DIAGNOSIS — H0102A Squamous blepharitis right eye, upper and lower eyelids: Secondary | ICD-10-CM | POA: Diagnosis not present

## 2022-10-23 DIAGNOSIS — Z961 Presence of intraocular lens: Secondary | ICD-10-CM | POA: Diagnosis not present

## 2022-10-23 DIAGNOSIS — H04123 Dry eye syndrome of bilateral lacrimal glands: Secondary | ICD-10-CM | POA: Diagnosis not present

## 2022-10-23 DIAGNOSIS — H179 Unspecified corneal scar and opacity: Secondary | ICD-10-CM | POA: Diagnosis not present

## 2022-10-30 ENCOUNTER — Other Ambulatory Visit: Payer: Self-pay

## 2022-11-05 DIAGNOSIS — L989 Disorder of the skin and subcutaneous tissue, unspecified: Secondary | ICD-10-CM | POA: Diagnosis not present

## 2022-11-05 DIAGNOSIS — Z0000 Encounter for general adult medical examination without abnormal findings: Secondary | ICD-10-CM | POA: Diagnosis not present

## 2022-11-05 DIAGNOSIS — R911 Solitary pulmonary nodule: Secondary | ICD-10-CM | POA: Diagnosis not present

## 2022-11-05 DIAGNOSIS — R600 Localized edema: Secondary | ICD-10-CM | POA: Diagnosis not present

## 2022-11-05 DIAGNOSIS — Z1211 Encounter for screening for malignant neoplasm of colon: Secondary | ICD-10-CM | POA: Diagnosis not present

## 2022-11-05 DIAGNOSIS — I700 Atherosclerosis of aorta: Secondary | ICD-10-CM | POA: Diagnosis not present

## 2022-11-05 DIAGNOSIS — M8588 Other specified disorders of bone density and structure, other site: Secondary | ICD-10-CM | POA: Diagnosis not present

## 2022-11-05 DIAGNOSIS — D6869 Other thrombophilia: Secondary | ICD-10-CM | POA: Diagnosis not present

## 2022-11-05 DIAGNOSIS — M545 Low back pain, unspecified: Secondary | ICD-10-CM | POA: Diagnosis not present

## 2022-11-05 DIAGNOSIS — F3342 Major depressive disorder, recurrent, in full remission: Secondary | ICD-10-CM | POA: Diagnosis not present

## 2022-11-05 DIAGNOSIS — I10 Essential (primary) hypertension: Secondary | ICD-10-CM | POA: Diagnosis not present

## 2022-11-05 DIAGNOSIS — R350 Frequency of micturition: Secondary | ICD-10-CM | POA: Diagnosis not present

## 2022-11-05 DIAGNOSIS — I4892 Unspecified atrial flutter: Secondary | ICD-10-CM | POA: Diagnosis not present

## 2022-11-11 ENCOUNTER — Telehealth: Payer: Self-pay | Admitting: Emergency Medicine

## 2022-11-11 NOTE — Telephone Encounter (Signed)
2 nd request for Echocardiogram results 11/11/22. First request was 10/08/22

## 2022-11-12 DIAGNOSIS — L219 Seborrheic dermatitis, unspecified: Secondary | ICD-10-CM | POA: Diagnosis not present

## 2022-11-12 DIAGNOSIS — L820 Inflamed seborrheic keratosis: Secondary | ICD-10-CM | POA: Diagnosis not present

## 2022-11-16 ENCOUNTER — Other Ambulatory Visit: Payer: Self-pay

## 2022-11-16 MED ORDER — AMLODIPINE BESYLATE 2.5 MG PO TABS
2.5000 mg | ORAL_TABLET | Freq: Every day | ORAL | 3 refills | Status: DC
  Filled 2022-12-25: qty 90, 90d supply, fill #0
  Filled 2023-02-08 – 2023-05-18 (×2): qty 90, 90d supply, fill #1
  Filled 2023-06-18 – 2023-08-13 (×3): qty 90, 90d supply, fill #2
  Filled 2023-09-13: qty 90, 90d supply, fill #3

## 2022-11-16 MED ORDER — ELIQUIS 5 MG PO TABS
5.0000 mg | ORAL_TABLET | Freq: Two times a day (BID) | ORAL | 3 refills | Status: DC
  Filled 2022-11-28: qty 180, 90d supply, fill #0
  Filled 2022-12-25 – 2023-02-26 (×3): qty 180, 90d supply, fill #1
  Filled 2023-05-18: qty 180, 90d supply, fill #2
  Filled 2023-06-18 – 2023-08-13 (×3): qty 180, 90d supply, fill #3

## 2022-11-16 MED ORDER — ESCITALOPRAM OXALATE 20 MG PO TABS: 20.0000 mg | ORAL_TABLET | Freq: Every day | ORAL | 3 refills | Status: DC

## 2022-11-16 MED ORDER — FAMOTIDINE 40 MG PO TABS
40.0000 mg | ORAL_TABLET | Freq: Every evening | ORAL | 3 refills | Status: DC
  Filled 2022-11-16: qty 90, 90d supply, fill #0
  Filled 2022-12-25 – 2023-02-08 (×2): qty 90, 90d supply, fill #1
  Filled 2023-05-18: qty 90, 90d supply, fill #2
  Filled 2023-06-18 – 2023-08-13 (×3): qty 90, 90d supply, fill #3

## 2022-11-16 MED ORDER — LOSARTAN POTASSIUM 100 MG PO TABS
100.0000 mg | ORAL_TABLET | Freq: Every day | ORAL | 3 refills | Status: DC
  Filled 2022-12-25: qty 90, 90d supply, fill #0
  Filled 2023-02-08 – 2023-03-25 (×2): qty 90, 90d supply, fill #1
  Filled 2023-05-18 – 2023-06-18 (×2): qty 90, 90d supply, fill #2
  Filled 2023-07-17 – 2023-09-13 (×3): qty 90, 90d supply, fill #3

## 2022-11-17 ENCOUNTER — Other Ambulatory Visit: Payer: Self-pay

## 2022-11-18 ENCOUNTER — Encounter: Payer: Self-pay | Admitting: Cardiology

## 2022-11-18 ENCOUNTER — Ambulatory Visit: Payer: PPO | Attending: Cardiology | Admitting: Cardiology

## 2022-11-18 ENCOUNTER — Telehealth: Payer: Self-pay | Admitting: *Deleted

## 2022-11-18 VITALS — BP 138/88 | HR 58 | Ht 63.0 in | Wt 175.4 lb

## 2022-11-18 DIAGNOSIS — I10 Essential (primary) hypertension: Secondary | ICD-10-CM

## 2022-11-18 DIAGNOSIS — I495 Sick sinus syndrome: Secondary | ICD-10-CM | POA: Diagnosis not present

## 2022-11-18 DIAGNOSIS — D6869 Other thrombophilia: Secondary | ICD-10-CM

## 2022-11-18 DIAGNOSIS — R400 Somnolence: Secondary | ICD-10-CM | POA: Diagnosis not present

## 2022-11-18 DIAGNOSIS — I480 Paroxysmal atrial fibrillation: Secondary | ICD-10-CM | POA: Diagnosis not present

## 2022-11-18 DIAGNOSIS — R0683 Snoring: Secondary | ICD-10-CM

## 2022-11-18 DIAGNOSIS — I483 Typical atrial flutter: Secondary | ICD-10-CM | POA: Diagnosis not present

## 2022-11-18 NOTE — Patient Instructions (Addendum)
Medication Instructions:  Your physician has recommended you make the following change in your medication:  START Amiodarone -- we will start this once you switch to a different antidepressant.  Let us know when you have switch and we will instruct you when to start this.  - take 1 tablet (200 mg total) TWICE daily for one month, then reduce and  - take 1 tablet (200 mg total) ONCE daily thereafter   Please talk to your primary doctor about switching to a different antidepressant - an SNRI such as:  Venlafaxine, Duloxetine SSRIs, like Lexapro, cannot be take with antiarrhythmics.  *If you need a refill on your cardiac medications before your next appointment, please call your pharmacy*   Lab Work: Pre procedure labs -- see procedure instruction letter:  BMP & CBC  If you have a lab test that is abnormal and we need to change your treatment, we will call you to review the results -- otherwise no news is good news.    Testing/Procedures: Your physician has recommended that you have a home sleep study. This test records several body functions during sleep, including: brain activity, eye movement, oxygen and carbon dioxide blood levels, heart rate and rhythm, breathing rate and rhythm, the flow of air through your mouth and nose, snoring, body muscle movements, and chest and belly movement.   Your physician has requested that you have cardiac CT within 2 weeks PRIOR to your ablation. Cardiac computed tomography (CT) is a painless test that uses an x-ray machine to take clear, detailed pictures of your heart.  Please follow instruction below located under "other instructions". You will get a call from our office to schedule the date for this test.  Your physician has recommended that you have an ablation. Catheter ablation is a medical procedure used to treat some cardiac arrhythmias (irregular heartbeats). During catheter ablation, a long, thin, flexible tube is put into a blood vessel in your  groin (upper thigh), or neck. This tube is called an ablation catheter. It is then guided to your heart through the blood vessel. Radio frequency waves destroy small areas of heart tissue where abnormal heartbeats may cause an arrhythmia to start.   You will be scheduled for _________.  The EP scheduler, April, will be in touch with CT & procedure instructions.   Follow-Up: At [facility], you and your health needs are our priority.  As part of our continuing mission to provide you with exceptional heart care, we have created designated Provider Care Teams.  These Care Teams include your primary Cardiologist (physician) and Advanced Practice Providers (APPs -  Physician Assistants and Nurse Practitioners) who all work together to provide you with the care you need, when you need it.  We recommend signing up for the patient portal called "MyChart".  Sign up information is provided on this After Visit Summary.  MyChart is used to connect with patients for Virtual Visits (Telemedicine).  Patients are able to view lab/test results, encounter notes, upcoming appointments, etc.  Non-urgent messages can be sent to your provider as well.   To learn more about what you can do with MyChart, go to [email or web address].    Your next appointment:   1 month(s) after your ablation  The format for your next appointment:   In Person  Provider:   AFib clinic   Thank you for choosing [facility]!!   Dory Horn, RN [phone]    Other Instructions  Amiodarone Tablets What is this medication? AMIODARONE (  a MEE oh da rone) prevents and treats a fast or irregular heartbeat (arrhythmia). It works by slowing down overactive electric signals in the heart, which stabilizes your heart rhythm. It belongs to a group of medications called antiarrhythmics. This medicine may be used for other purposes; ask your health care provider or pharmacist if you have questions. COMMON BRAND NAME(S): Cordarone,  Pacerone What should I tell my care team before I take this medication? They need to know if you have any of these conditions: Liver disease Lung disease Other heart problems Thyroid disease An unusual or allergic reaction to amiodarone, iodine, other medications, foods, dyes, or preservatives Pregnant or trying to get pregnant Breast-feeding How should I use this medication? Take this medication by mouth with water. Take it as directed on the prescription label at the same time every day. You can take it with or without food. You should always take it the same way. Keep taking it unless your care team tells you to stop. A special MedGuide will be given to you by the pharmacist with each prescription and refill. Be sure to read this information carefully each time. Talk to your care team about the use of this medication in children. Special care may be needed. Overdosage: If you think you have taken too much of this medicine contact a poison control center or emergency room at once. NOTE: This medicine is only for you. Do not share this medicine with others. What if I miss a dose? If you miss a dose, take it as soon as you can. If it is almost time for your next dose, take only that dose. Do not take double or extra doses. What may interact with this medication? Do not take this medication with any of the following: Abarelix Apomorphine Arsenic trioxide Certain antibiotics, such as erythromycin, gemifloxacin, levofloxacin, or pentamidine Certain medications for depression, such as amoxapine or tricyclic antidepressants Certain medications for fungal infections, such as fluconazole, itraconazole, ketoconazole, posaconazole, or voriconazole Certain medications for irregular heartbeat, such as disopyramide, dronedarone, ibutilide, propafenone, or sotalol Certain medications for malaria, such as chloroquine or  halofantrine Cisapride Droperidol Haloperidol Hawthorn Maprotiline Methadone Phenothiazines, such as chlorpromazine, mesoridazine, or thioridazine Pimozide Ranolazine Red yeast rice Vardenafil This medication may also interact with the following: Antivirals for HIV Certain medications for blood pressure, heart disease, irregular heartbeat Certain medications for cholesterol, such as atorvastatin, cerivastatin, lovastatin, or simvastatin Certain medications for hepatitis C, such as sofosbuvir and ledipasvir; sofosbuvir Certain medications for seizures, such as phenytoin Certain medications for thyroid problems Certain medications that prevent or treat blood clots, such as warfarin Cholestyramine Cimetidine Clopidogrel Cyclosporine Dextromethorphan Diuretics Dofetilide Fentanyl General anesthetics Grapefruit juice Lidocaine Loratadine Methotrexate Other medications that cause heart rhythm changes Procainamide Quinidine Rifabutin, rifampin, or rifapentine St. John's Wort Trazodone Ziprasidone This list may not describe all possible interactions. Give your health care provider a list of all the medicines, herbs, non-prescription drugs, or dietary supplements you use. Also tell them if you smoke, drink alcohol, or use illegal drugs. Some items may interact with your medicine. What should I watch for while using this medication? Your condition will be monitored closely when you first begin therapy. This medication is often started in a hospital or other monitored health care setting. Once you are on maintenance therapy, visit your care team for regular checks on your progress. Because your condition and use of this medication carry some risk, it is a good idea to carry an identification card, necklace, or bracelet  with details of your condition, medications, and care team. This medication may affect your coordination, reaction time, or judgment. Do not drive or operate machinery  until you know how this medication affects you. Sit up or stand slowly to reduce the risk of dizzy or fainting spells. Drinking alcohol with this medication can increase the risk of these side effects. This medication can make you more sensitive to the sun. Keep out of the sun. If you cannot avoid being in the sun, wear protective clothing and sunscreen. Do not use sun lamps, tanning beds, or tanning booths. You should have regular eye exams before and during treatment. Call your care team if you have blurred vision, see halos, or your eyes become sensitive to light. Your eyes may get dry. It may be helpful to use a lubricating eye solution or artificial tears solution. If you are going to have surgery or a procedure that requires contrast dyes, tell your care team that you are taking this medication. What side effects may I notice from receiving this medication? Side effects that you should report to your care team as soon as possible: Allergic reactions--skin rash, itching, hives, swelling of the face, lips, tongue, or throat Bluish-gray skin Change in vision such as blurry vision, seeing halos around lights, vision loss Heart failure--shortness of breath, swelling of the ankles, feet, or hands, sudden weight gain, unusual weakness or fatigue Heart rhythm changes--fast or irregular heartbeat, dizziness, feeling faint or lightheaded, chest pain, trouble breathing High thyroid levels (hyperthyroidism)--fast or irregular heartbeat, weight loss, excessive sweating or sensitivity to heat, tremors or shaking, anxiety, nervousness, irregular menstrual cycle or spotting Liver injury--right upper belly pain, loss of appetite, nausea, light-colored stool, dark yellow or brown urine, yellowing skin or eyes, unusual weakness or fatigue Low thyroid levels (hypothyroidism)--unusual weakness or fatigue, sensitivity to cold, constipation, hair loss, dry skin, weight gain, feelings of depression Lung  injury--shortness of breath or trouble breathing, cough, spitting up blood, chest pain, fever Pain, tingling, or numbness in the hands or feet, muscle weakness, trouble walking, loss of balance or coordination Side effects that usually do not require medical attention (report to your care team if they continue or are bothersome): Nausea Vomiting This list may not describe all possible side effects. Call your doctor for medical advice about side effects. You may report side effects to FDA at 1-800-FDA-1088. Where should I keep my medication? Keep out of the reach of children and pets. Store at room temperature between 20 and 25 degrees C (68 and 77 degrees F). Protect from light. Keep container tightly closed. Throw away any unused medication after the expiration date. NOTE: This sheet is a summary. It may not cover all possible information. If you have questions about this medicine, talk to your doctor, pharmacist, or health care provider.  2024 Elsevier/Gold Standard (2021-08-29 00:00:00)     Cardiac Ablation Cardiac ablation is a procedure to destroy (ablate) some heart tissue that is sending bad signals. These bad signals cause problems in heart rhythm. The heart has many areas that make these signals. If there are problems in these areas, they can make the heart beat in a way that is not normal. Destroying some tissues can help make the heart rhythm normal. Tell your doctor about: Any allergies you have. All medicines you are taking. These include vitamins, herbs, eye drops, creams, and over-the-counter medicines. Any problems you or family members have had with medicines that make you fall asleep (anesthetics). Any blood disorders you have.  Any surgeries you have had. Any medical conditions you have, such as kidney failure. Whether you are pregnant or may be pregnant. What are the risks? This is a safe procedure. But problems may occur, including: Infection. Bruising and  bleeding. Bleeding into the chest. Stroke or blood clots. Damage to nearby areas of your body. Allergies to medicines or dyes. The need for a pacemaker if the normal system is damaged. Failure of the procedure to treat the problem. What happens before the procedure? Medicines Ask your doctor about: Changing or stopping your normal medicines. This is important. Taking aspirin and ibuprofen. Do not take these medicines unless your doctor tells you to take them. Taking other medicines, vitamins, herbs, and supplements. General instructions Follow instructions from your doctor about what you cannot eat or drink. Plan to have someone take you home from the hospital or clinic. If you will be going home right after the procedure, plan to have someone with you for 24 hours. Ask your doctor what steps will be taken to prevent infection. What happens during the procedure?  An IV tube will be put into one of your veins. You will be given a medicine to help you relax. The skin on your neck or groin will be numbed. A cut (incision) will be made in your neck or groin. A needle will be put through your cut and into a large vein. A tube (catheter) will be put into the needle. The tube will be moved to your heart. Dye may be put through the tube. This helps your doctor see your heart. Small devices (electrodes) on the tube will send out signals. A type of energy will be used to destroy some heart tissue. The tube will be taken out. Pressure will be held on your cut. This helps stop bleeding. A bandage will be put over your cut. The exact procedure may vary among doctors and hospitals. What happens after the procedure? You will be watched until you leave the hospital or clinic. This includes checking your heart rate, breathing rate, oxygen, and blood pressure. Your cut will be watched for bleeding. You will need to lie still for a few hours. Do not drive for 24 hours or as long as your doctor tells  you. Summary Cardiac ablation is a procedure to destroy some heart tissue. This is done to treat heart rhythm problems. Tell your doctor about any medical conditions you may have. Tell him or her about all medicines you are taking to treat them. This is a safe procedure. But problems may occur. These include infection, bruising, bleeding, and damage to nearby areas of your body. Follow what your doctor tells you about food and drink. You may also be told to change or stop some of your medicines. After the procedure, do not drive for 24 hours or as long as your doctor tells you. This information is not intended to replace advice given to you by your health care provider. Make sure you discuss any questions you have with your health care provider. Document Revised: 05/02/2021 Document Reviewed: 01/12/2019 Elsevier Patient Education  2023 Elsevier Inc.   Cardiac Ablation, Care After  This sheet gives you information about how to care for yourself after your procedure. Your health care provider may also give you more specific instructions. If you have problems or questions, contact your health care provider. What can I expect after the procedure? After the procedure, it is common to have: Bruising around your puncture site. Tenderness around your puncture  site. Skipped heartbeats. If you had an atrial fibrillation ablation, you may have atrial fibrillation during the first several months after your procedure.  Tiredness (fatigue).  Follow these instructions at home: Puncture site care  Follow instructions from your health care provider about how to take care of your puncture site. Make sure you: If present, leave stitches (sutures), skin glue, or adhesive strips in place. These skin closures may need to stay in place for up to 2 weeks. If adhesive strip edges start to loosen and curl up, you may trim the loose edges. Do not remove adhesive strips completely unless your health care provider  tells you to do that. If a large square bandage is present, this may be removed 24 hours after surgery.  Check your puncture site every day for signs of infection. Check for: Redness, swelling, or pain. Fluid or blood. If your puncture site starts to bleed, lie down on your back, apply firm pressure to the area, and contact your health care provider. Warmth. Pus or a bad smell. A pea or small marble sized lump at the site is normal and can take up to three months to resolve.  Driving Do not drive for at least 4 days after your procedure or however long your health care provider recommends. (Do not resume driving if you have previously been instructed not to drive for other health reasons.) Do not drive or use heavy machinery while taking prescription pain medicine. Activity Avoid activities that take a lot of effort for at least 7 days after your procedure. Do not lift anything that is heavier than 5 lb (4.5 kg) for one week.  No sexual activity for 1 week.  Return to your normal activities as told by your health care provider. Ask your health care provider what activities are safe for you. General instructions Take over-the-counter and prescription medicines only as told by your health care provider. Do not use any products that contain nicotine or tobacco, such as cigarettes and e-cigarettes. If you need help quitting, ask your health care provider. You may shower after 24 hours, but Do not take baths, swim, or use a hot tub for 1 week.  Do not drink alcohol for 24 hours after your procedure. Keep all follow-up visits as told by your health care provider. This is important. Contact a health care provider if: You have redness, mild swelling, or pain around your puncture site. You have fluid or blood coming from your puncture site that stops after applying firm pressure to the area. Your puncture site feels warm to the touch. You have pus or a bad smell coming from your puncture site. You  have a fever. You have chest pain or discomfort that spreads to your neck, jaw, or arm. You have chest pain that is worse with lying on your back or taking a deep breath. You are sweating a lot. You feel nauseous. You have a fast or irregular heartbeat. You have shortness of breath. You are dizzy or light-headed and feel the need to lie down. You have pain or numbness in the arm or leg closest to your puncture site. Get help right away if: Your puncture site suddenly swells. Your puncture site is bleeding and the bleeding does not stop after applying firm pressure to the area. These symptoms may represent a serious problem that is an emergency. Do not wait to see if the symptoms will go away. Get medical help right away. Call your local emergency services (911 in the  [place] Do not drive yourself to the hospital. Summary After the procedure, it is normal to have bruising and tenderness at the puncture site in your groin, neck, or forearm. Check your puncture site every day for signs of infection. Get help right away if your puncture site is bleeding and the bleeding does not stop after applying firm pressure to the area. This is a medical emergency. This information is not intended to replace advice given to you by your health care provider. Make sure you discuss any questions you have with your health care provider.

## 2022-11-18 NOTE — Telephone Encounter (Signed)
Patient agreement reviewed and signed on 11/18/2022.  WatchPAT issued to patient on 11/18/2022 by Danielle Rankin, CMA. Patient aware to not open the WatchPAT box until contacted with the activation PIN. Patient profile initialized in CloudPAT on 11/18/2022 by Ernesta Trabert. Device serial number: [id number]  Please list Reason for Call as Advice Only and type "WatchPAT issued to patient" in the comment box.

## 2022-11-18 NOTE — Progress Notes (Signed)
Electrophysiology Office Note:   Date:  11/18/2022  ID:  Dominique Griffith, Dominique Griffith 1940/08/12, MRN [id number]  Primary Cardiologist: Thurmon Fair, MD Electrophysiologist: Regan Lemming, MD      History of Present Illness:   Dominique Griffith is a 82 y.o. female with h/o hypertension, atrial fibrillation/flutter seen today for  for Electrophysiology evaluation of atrial fibrillation/flutter at the request of Mihai Croitrou.    She has issues with exertional dyspnea.  She can walk approximately 1 block.  This is a change from a year ago.  She has had palpitations, but there are no clear correlation between palpitations and dyspnea.  She has not had syncope, but she does feel woozy at times when she has palpitations.  She wore a cardiac monitor that showed episodes of atrial flutter with postconversion pauses of approximately 5 seconds.  Today, she feels well.  She has no chest pain or shortness of breath.  She is able to do most of her daily activities, though she does get short of breath when she exerts herself over the distances of a block.  When she is in atrial fibrillation, she feels weak, fatigued, more short of breath.  She has dizziness as well.  Review of systems complete and found to be negative unless listed in HPI.   EP Information / Studies Reviewed:    EKG is not ordered today. EKG from 09/25/22 reviewed which showed sinus rhythm        Risk Assessment/Calculations:    CHA2DS2-VASc Score = 4   This indicates a 4.8% annual risk of stroke. The patient's score is based upon: CHF History: 0 HTN History: 1 Diabetes History: 0 Stroke History: 0 Vascular Disease History: 0 Age Score: 2 Gender Score: 1             Physical Exam:   VS:  BP 138/88 (BP Location: Left Arm, Patient Position: Sitting, Cuff Size: Large)   Pulse (!) 58   Ht 5\' 3"  (1.6 m)   Wt 175 lb 6.4 oz (79.6 kg)   SpO2 98%   BMI 31.07 kg/m    Wt Readings from Last 3 Encounters:  11/18/22 175 lb 6.4  oz (79.6 kg)  09/25/22 174 lb 3.2 oz (79 kg)  07/12/20 160 lb (72.6 kg)     GEN: Well nourished, well developed in no acute distress NECK: No JVD; No carotid bruits CARDIAC: Regular rate and rhythm, no murmurs, rubs, gallops RESPIRATORY:  Clear to auscultation without rales, wheezing or rhonchi  ABDOMEN: Soft, non-tender, non-distended EXTREMITIES:  No edema; No deformity   ASSESSMENT AND PLAN:    1.  Paroxysmal atrial fibrillation/typical atrial flutter: Found on cardiac monitor.  She does have AV nodal conduction disease with postconversion pauses.  She would benefit from rhythm control to hopefully prevent her from having pauses.  We discussed medications versus ablation.  She would like to avoid long-term medications.  Due to that, we Jerine Surles plan for ablation.  Risks and benefits have been discussed.  She understands the risks and is agreed to the procedure.  In the interim, we Gearldine Looney plan for amiodarone to try and keep her in normal rhythm and prevent pauses.  Risk, benefits, and alternatives to EP study and radiofrequency/pulse field ablation for afib were also discussed in detail today. These risks include but are not limited to stroke, bleeding, vascular damage, tamponade, perforation, damage to the esophagus, lungs, and other structures, pulmonary vein stenosis, worsening renal function, and death. The patient understands these  risk and wishes to proceed.  We Harvin Konicek therefore proceed with catheter ablation at the next available time.  Carto, ICE, anesthesia are requested for the procedure.  Audra Kagel also obtain CT PV protocol prior to the procedure to exclude LAA thrombus and further evaluate atrial anatomy.  2.  Tachybradycardia syndrome: Rhythm control as above  3.  Secondary hypercoagulable state: Currently on Eliquis for atrial flutter  4.  Hypertension: Currently well-controlled  5.  Snoring: Has witnessed apnea.  Ranisha Allaire plan for sleep study.  Follow up with Dr. Elberta Fortis as usual post  procedure  Signed, Tahir Blank Jorja Loa, MD

## 2022-11-19 ENCOUNTER — Other Ambulatory Visit: Payer: Self-pay

## 2022-11-19 MED ORDER — BACLOFEN 10 MG PO TABS
10.0000 mg | ORAL_TABLET | Freq: Three times a day (TID) | ORAL | 3 refills | Status: DC
  Filled 2022-11-19: qty 270, 90d supply, fill #0
  Filled 2023-05-18: qty 270, 90d supply, fill #1
  Filled 2023-06-18 – 2023-09-13 (×2): qty 270, 90d supply, fill #2
  Filled 2023-10-30 – 2023-11-10 (×2): qty 270, 90d supply, fill #3

## 2022-11-24 ENCOUNTER — Other Ambulatory Visit: Payer: Self-pay

## 2022-11-30 ENCOUNTER — Other Ambulatory Visit: Payer: Self-pay

## 2022-12-02 ENCOUNTER — Other Ambulatory Visit: Payer: Self-pay

## 2022-12-02 MED ORDER — COVID-19 MRNA VAC-TRIS(PFIZER) 30 MCG/0.3ML IM SUSY
0.3000 mL | PREFILLED_SYRINGE | Freq: Once | INTRAMUSCULAR | 0 refills | Status: AC
  Filled 2022-12-02: qty 0.3, 1d supply, fill #0

## 2022-12-02 MED ORDER — INFLUENZA VAC A&B SURF ANT ADJ 0.5 ML IM SUSY
0.5000 mL | PREFILLED_SYRINGE | Freq: Once | INTRAMUSCULAR | 0 refills | Status: AC
  Filled 2022-12-02: qty 0.5, 1d supply, fill #0

## 2022-12-03 ENCOUNTER — Other Ambulatory Visit: Payer: Self-pay

## 2022-12-03 MED ORDER — OSELTAMIVIR PHOSPHATE 75 MG PO CAPS
75.0000 mg | ORAL_CAPSULE | Freq: Every day | ORAL | 0 refills | Status: DC
  Filled 2022-12-03: qty 10, 10d supply, fill #0

## 2022-12-03 MED ORDER — DULOXETINE HCL 30 MG PO CPEP
30.0000 mg | ORAL_CAPSULE | Freq: Every day | ORAL | 3 refills | Status: DC
  Filled 2022-12-03: qty 30, 30d supply, fill #0
  Filled 2022-12-25: qty 30, 30d supply, fill #1
  Filled 2023-02-08: qty 30, 30d supply, fill #2

## 2022-12-04 ENCOUNTER — Telehealth: Payer: Self-pay | Admitting: *Deleted

## 2022-12-04 ENCOUNTER — Other Ambulatory Visit: Payer: Self-pay

## 2022-12-04 DIAGNOSIS — I480 Paroxysmal atrial fibrillation: Secondary | ICD-10-CM

## 2022-12-04 NOTE — Telephone Encounter (Signed)
Scheduled afib ablation for 10/31 Aware office will be in touch to review instructions. Pt and husband agreeable to plan.

## 2022-12-07 ENCOUNTER — Telehealth: Payer: Self-pay

## 2022-12-07 ENCOUNTER — Other Ambulatory Visit: Payer: Self-pay

## 2022-12-07 DIAGNOSIS — N3941 Urge incontinence: Secondary | ICD-10-CM | POA: Diagnosis not present

## 2022-12-07 DIAGNOSIS — N83209 Unspecified ovarian cyst, unspecified side: Secondary | ICD-10-CM | POA: Diagnosis not present

## 2022-12-07 DIAGNOSIS — R159 Full incontinence of feces: Secondary | ICD-10-CM | POA: Diagnosis not present

## 2022-12-07 DIAGNOSIS — Z01419 Encounter for gynecological examination (general) (routine) without abnormal findings: Secondary | ICD-10-CM | POA: Diagnosis not present

## 2022-12-07 MED ORDER — OXYBUTYNIN CHLORIDE ER 5 MG PO TB24
5.0000 mg | ORAL_TABLET | Freq: Every day | ORAL | 0 refills | Status: DC
  Filled 2022-12-07: qty 30, 30d supply, fill #0

## 2022-12-07 NOTE — Telephone Encounter (Signed)
LM for pt to call back to schedule CT Scan and labs prior to Afib Ablation.

## 2022-12-08 ENCOUNTER — Other Ambulatory Visit: Payer: Self-pay

## 2022-12-09 ENCOUNTER — Telehealth: Payer: Self-pay

## 2022-12-09 NOTE — Telephone Encounter (Signed)
**Note De-Identified Petina Muraski Obfuscation** [facility]ST is pending HTA review. Authorization [id number]

## 2022-12-14 ENCOUNTER — Other Ambulatory Visit: Payer: PPO

## 2022-12-14 ENCOUNTER — Other Ambulatory Visit: Payer: Self-pay | Admitting: *Deleted

## 2022-12-14 DIAGNOSIS — I480 Paroxysmal atrial fibrillation: Secondary | ICD-10-CM

## 2022-12-14 DIAGNOSIS — Z01812 Encounter for preprocedural laboratory examination: Secondary | ICD-10-CM

## 2022-12-15 ENCOUNTER — Encounter: Payer: Self-pay | Admitting: Cardiology

## 2022-12-15 LAB — CBC
Hematocrit: 43.1 % (ref 34.0–46.6)
Hemoglobin: 14 g/dL (ref 11.1–15.9)
MCH: 32.4 pg (ref 26.6–33.0)
MCHC: 32.5 g/dL (ref 31.5–35.7)
MCV: 100 fL — ABNORMAL HIGH (ref 79–97)
Platelets: 275 10*3/uL (ref 150–450)
RBC: 4.32 x10E6/uL (ref 3.77–5.28)
RDW: 11.6 % — ABNORMAL LOW (ref 11.7–15.4)
WBC: 6.6 10*3/uL (ref 3.4–10.8)

## 2022-12-15 LAB — BASIC METABOLIC PANEL
BUN/Creatinine Ratio: 16 (ref 12–28)
BUN: 13 mg/dL (ref 8–27)
CO2: 23 mmol/L (ref 20–29)
Calcium: 9.5 mg/dL (ref 8.7–10.3)
Chloride: 102 mmol/L (ref 96–106)
Creatinine, Ser: 0.79 mg/dL (ref 0.57–1.00)
Glucose: 97 mg/dL (ref 70–99)
Potassium: 4.9 mmol/L (ref 3.5–5.2)
Sodium: 142 mmol/L (ref 134–144)
eGFR: 76 mL/min/{1.73_m2} (ref >59)

## 2022-12-17 ENCOUNTER — Ambulatory Visit (HOSPITAL_COMMUNITY)
Admission: RE | Admit: 2022-12-17 | Discharge: 2022-12-17 | Disposition: A | Discharge status: Home / Self Care | Payer: PPO | Source: Ambulatory Visit | Attending: Cardiology | Admitting: Cardiology

## 2022-12-17 DIAGNOSIS — I480 Paroxysmal atrial fibrillation: Secondary | ICD-10-CM | POA: Diagnosis not present

## 2022-12-17 MED ORDER — IOHEXOL 350 MG/ML SOLN
95.0000 mL | Freq: Once | INTRAVENOUS | Status: AC | PRN
  Administered 2022-12-17: 95 mL via INTRAVENOUS

## 2022-12-17 NOTE — Telephone Encounter (Signed)
Aware we forwarded note to sleep  study team 2 days ago and they should be in touch with her about sleep study/pin  Aware ok for steroid injection as long as not hold OAC. Pt appreciates my call

## 2022-12-17 NOTE — Telephone Encounter (Signed)
Ordering provider: DR Elberta Fortis Associated diagnoses: R06.83--R40.0 WatchPAT PA obtained on 12/17/2022 by Latrelle Dodrill, CMA. Authorization: No; tracking ID [id number] Patient notified of PIN ([phone]) on 12/17/2022 via Notification Method: phone.

## 2022-12-17 NOTE — Progress Notes (Signed)
Confirmed with Dr Rennis Golden to proceed with pulmonary vein CT despite pt having coffee this AM. HR 64.

## 2022-12-21 ENCOUNTER — Ambulatory Visit: Payer: PPO | Admitting: Podiatry

## 2022-12-21 ENCOUNTER — Ambulatory Visit (INDEPENDENT_AMBULATORY_CARE_PROVIDER_SITE_OTHER): Payer: PPO

## 2022-12-21 ENCOUNTER — Encounter: Payer: Self-pay | Admitting: Podiatry

## 2022-12-21 DIAGNOSIS — M7751 Other enthesopathy of right foot: Secondary | ICD-10-CM

## 2022-12-21 NOTE — Patient Instructions (Signed)

## 2022-12-22 ENCOUNTER — Encounter: Payer: Self-pay | Admitting: Cardiology

## 2022-12-23 NOTE — Pre-Procedure Instructions (Signed)
Instructed patient on the following items: Arrival time 0800 Nothing to eat or drink after midnight No meds AM of procedure Responsible person to drive you home and stay with you for 24 hrs  Have you missed any doses of anti-coagulant Eliquis- takes twice a day, hasn't missed any doses.  Don't take dose in there morning.

## 2022-12-24 ENCOUNTER — Ambulatory Visit (HOSPITAL_COMMUNITY): Payer: Self-pay | Admitting: Anesthesiology

## 2022-12-24 ENCOUNTER — Ambulatory Visit (HOSPITAL_BASED_OUTPATIENT_CLINIC_OR_DEPARTMENT_OTHER): Payer: Self-pay | Admitting: Anesthesiology

## 2022-12-24 ENCOUNTER — Other Ambulatory Visit: Payer: Self-pay

## 2022-12-24 ENCOUNTER — Encounter (HOSPITAL_COMMUNITY): Payer: Self-pay | Admitting: Cardiology

## 2022-12-24 ENCOUNTER — Ambulatory Visit (HOSPITAL_COMMUNITY)
Admission: RE | Admit: 2022-12-24 | Discharge: 2022-12-24 | Disposition: A | Discharge status: Home / Self Care | Payer: PPO | Attending: Cardiology | Admitting: Cardiology

## 2022-12-24 ENCOUNTER — Encounter (HOSPITAL_COMMUNITY)
Admission: RE | Disposition: A | Discharge status: Home / Self Care | Payer: Self-pay | Source: Home / Self Care | Attending: Cardiology

## 2022-12-24 DIAGNOSIS — I480 Paroxysmal atrial fibrillation: Secondary | ICD-10-CM | POA: Insufficient documentation

## 2022-12-24 DIAGNOSIS — I10 Essential (primary) hypertension: Secondary | ICD-10-CM | POA: Insufficient documentation

## 2022-12-24 DIAGNOSIS — R0609 Other forms of dyspnea: Secondary | ICD-10-CM | POA: Insufficient documentation

## 2022-12-24 DIAGNOSIS — I4892 Unspecified atrial flutter: Secondary | ICD-10-CM | POA: Insufficient documentation

## 2022-12-24 DIAGNOSIS — I4891 Unspecified atrial fibrillation: Secondary | ICD-10-CM | POA: Diagnosis not present

## 2022-12-24 DIAGNOSIS — K219 Gastro-esophageal reflux disease without esophagitis: Secondary | ICD-10-CM | POA: Insufficient documentation

## 2022-12-24 DIAGNOSIS — Z87891 Personal history of nicotine dependence: Secondary | ICD-10-CM | POA: Insufficient documentation

## 2022-12-24 DIAGNOSIS — Z7901 Long term (current) use of anticoagulants: Secondary | ICD-10-CM | POA: Diagnosis not present

## 2022-12-24 HISTORY — PX: ATRIAL FIBRILLATION ABLATION: EP1191

## 2022-12-24 SURGERY — ATRIAL FIBRILLATION ABLATION
Anesthesia: General

## 2022-12-24 MED ORDER — HEPARIN (PORCINE) IN NACL 1000-0.9 UT/500ML-% IV SOLN
INTRAVENOUS | Status: DC | PRN
  Administered 2022-12-24 (×3): 500 mL

## 2022-12-24 MED ORDER — SODIUM CHLORIDE 0.9% FLUSH
3.0000 mL | INTRAVENOUS | Status: DC | PRN
Start: 2022-12-24 — End: 2022-12-24

## 2022-12-24 MED ORDER — PROPOFOL 10 MG/ML IV BOLUS
INTRAVENOUS | Status: DC | PRN
  Administered 2022-12-24: 130 mg via INTRAVENOUS
  Administered 2022-12-24 (×2): 20 mg via INTRAVENOUS

## 2022-12-24 MED ORDER — ONDANSETRON HCL 4 MG/2ML IJ SOLN
INTRAMUSCULAR | Status: DC | PRN
  Administered 2022-12-24: 4 mg via INTRAVENOUS

## 2022-12-24 MED ORDER — ROCURONIUM BROMIDE 10 MG/ML (PF) SYRINGE
PREFILLED_SYRINGE | INTRAVENOUS | Status: DC | PRN
  Administered 2022-12-24: 50 mg via INTRAVENOUS
  Administered 2022-12-24: 10 mg via INTRAVENOUS

## 2022-12-24 MED ORDER — ACETAMINOPHEN 500 MG PO TABS
1000.0000 mg | ORAL_TABLET | Freq: Once | ORAL | Status: AC
  Administered 2022-12-24: 1000 mg via ORAL
  Filled 2022-12-24: qty 2

## 2022-12-24 MED ORDER — ONDANSETRON HCL 4 MG/2ML IJ SOLN: 4.0000 mg | Freq: Four times a day (QID) | INTRAMUSCULAR | Status: DC | PRN

## 2022-12-24 MED ORDER — ATROPINE SULFATE 0.4 MG/ML IV SOLN
INTRAVENOUS | Status: DC | PRN
  Administered 2022-12-24: 1 mg via INTRAVENOUS

## 2022-12-24 MED ORDER — PROPOFOL 500 MG/50ML IV EMUL
INTRAVENOUS | Status: DC | PRN
  Administered 2022-12-24: 125 ug/kg/min via INTRAVENOUS

## 2022-12-24 MED ORDER — SODIUM CHLORIDE 0.9 % IV SOLN
250.0000 mL | INTRAVENOUS | Status: DC | PRN
Start: 2022-12-24 — End: 2022-12-24

## 2022-12-24 MED ORDER — ACETAMINOPHEN 325 MG PO TABS
650.0000 mg | ORAL_TABLET | ORAL | Status: DC | PRN
Start: 2022-12-24 — End: 2022-12-24

## 2022-12-24 MED ORDER — HEPARIN SODIUM (PORCINE) 1000 UNIT/ML IJ SOLN
INTRAMUSCULAR | Status: DC | PRN
  Administered 2022-12-24: 14000 [IU] via INTRAVENOUS

## 2022-12-24 MED ORDER — LIDOCAINE HCL (CARDIAC) PF 100 MG/5ML IV SOSY
PREFILLED_SYRINGE | INTRAVENOUS | Status: DC | PRN
  Administered 2022-12-24: 80 mg via INTRAVENOUS

## 2022-12-24 MED ORDER — PHENYLEPHRINE HCL-NACL 20-0.9 MG/250ML-% IV SOLN
INTRAVENOUS | Status: DC | PRN
  Administered 2022-12-24: 25 ug/min via INTRAVENOUS

## 2022-12-24 MED ORDER — PROTAMINE SULFATE 10 MG/ML IV SOLN
INTRAVENOUS | Status: DC | PRN
  Administered 2022-12-24: 40 mg via INTRAVENOUS

## 2022-12-24 MED ORDER — PHENYLEPHRINE 80 MCG/ML (10ML) SYRINGE FOR IV PUSH (FOR BLOOD PRESSURE SUPPORT)
PREFILLED_SYRINGE | INTRAVENOUS | Status: DC | PRN
Start: 2022-12-24 — End: 2022-12-24
  Administered 2022-12-24 (×2): 80 ug via INTRAVENOUS

## 2022-12-24 MED ORDER — FENTANYL CITRATE (PF) 100 MCG/2ML IJ SOLN
INTRAMUSCULAR | Status: AC
  Filled 2022-12-24: qty 2

## 2022-12-24 MED ORDER — FENTANYL CITRATE (PF) 100 MCG/2ML IJ SOLN
INTRAMUSCULAR | Status: DC | PRN
  Administered 2022-12-24: 50 ug via INTRAVENOUS

## 2022-12-24 MED ORDER — SODIUM CHLORIDE 0.9 % IV SOLN: INTRAVENOUS | Status: DC

## 2022-12-24 MED ORDER — PHENYLEPHRINE HCL-NACL 20-0.9 MG/250ML-% IV SOLN
INTRAVENOUS | Status: AC
Start: 2022-12-24 — End: ?
  Filled 2022-12-24: qty 250

## 2022-12-24 MED ORDER — PROPOFOL 1000 MG/100ML IV EMUL
INTRAVENOUS | Status: AC
  Filled 2022-12-24: qty 200

## 2022-12-24 MED ORDER — SODIUM CHLORIDE 0.9% FLUSH: 3.0000 mL | Freq: Two times a day (BID) | INTRAVENOUS | Status: DC

## 2022-12-24 MED ORDER — SUGAMMADEX SODIUM 200 MG/2ML IV SOLN
INTRAVENOUS | Status: DC | PRN
  Administered 2022-12-24: 200 mg via INTRAVENOUS

## 2022-12-24 MED ORDER — DEXAMETHASONE SODIUM PHOSPHATE 10 MG/ML IJ SOLN
INTRAMUSCULAR | Status: DC | PRN
  Administered 2022-12-24: 5 mg via INTRAVENOUS

## 2022-12-24 SURGICAL SUPPLY — 22 items
BAG SNAP BAND KOVER 36X36 (MISCELLANEOUS) ×1
BLANKET WARM UNDERBOD FULL ACC (MISCELLANEOUS) ×1
CABLE PFA RX CATH CONN (CABLE) ×1
CATH 8FR REPROCESSED SOUNDSTAR (CATHETERS) ×1 IMPLANT
CATH EZ STEER NAV 8MM F-J CUR (ABLATOR) ×1
CATH FARAWAVE ABLATION 31 (CATHETERS) ×1
CATH OCTARAY 2.0 F 3-3-3-3-3 (CATHETERS) ×1
CATH WEB BI DIR CSDF CRV REPRO (CATHETERS) ×1
CLOSURE MYNX CONTROL 6F/7F (Vascular Products) ×2 IMPLANT
CLOSURE PERCLOSE PROSTYLE (VASCULAR PRODUCTS) ×2
COVER SWIFTLINK CONNECTOR (BAG) ×1
DILATOR VESSEL 38 20CM 16FR (INTRODUCER) ×1
INQWIRE 1.5J .035X260CM (WIRE) ×1
PACK EP LATEX FREE (CUSTOM PROCEDURE TRAY) ×1
PACK EP LF (CUSTOM PROCEDURE TRAY) ×1
PAD DEFIB RADIO PHYSIO CONN (PAD) ×1
PATCH CARTO3 (PAD) ×1
SHEATH FARADRIVE STEERABLE (SHEATH) ×1
SHEATH PINNACLE 8F 10CM (SHEATH) ×2
SHEATH PINNACLE 9F 10CM (SHEATH) ×1
SHEATH PROBE COVER 6X72 (BAG) ×1
SHEATH WIRE KIT BAYLIS SL1 (KITS) ×1

## 2022-12-24 NOTE — H&P (Signed)
  Electrophysiology Office Note:   Date:  12/24/2022  ID:  Dominique Griffith, Dominique Griffith [birth year], MRN [id number]  Primary Cardiologist: Thurmon Fair, MD Electrophysiologist: Regan Lemming, MD      History of Present Illness:   Dominique Griffith is a 82 y.o. female with h/o hypertension, atrial fibrillation/flutter seen today for  for Electrophysiology evaluation of atrial fibrillation/flutter at the request of Mihai Croitrou.    She has issues with exertional dyspnea.  She can walk approximately 1 block.  This is a change from a year ago.  She has had palpitations, but there are no clear correlation between palpitations and dyspnea.  She has not had syncope, but she does feel woozy at times when she has palpitations.  She wore a cardiac monitor that showed episodes of atrial flutter with postconversion pauses of approximately 5 seconds.  Today, denies symptoms of palpitations, chest pain, shortness of breath, orthopnea, PND, lower extremity edema, claudication, dizziness, presyncope, syncope, bleeding, or neurologic sequela. The patient is tolerating medications without difficulties. Plan ablation today.   EP Information / Studies Reviewed:    EKG is not ordered today. EKG from 09/25/22 reviewed which showed sinus rhythm        Risk Assessment/Calculations:    CHA2DS2-VASc Score = 4   This indicates a 4.8% annual risk of stroke. The patient's score is based upon: CHF History: 0 HTN History: 1 Diabetes History: 0 Stroke History: 0 Vascular Disease History: 0 Age Score: 2 Gender Score: 1             Physical Exam:   VS:  BP 133/61   Pulse 60   Temp (!) 97.5 F (36.4 C) (Oral)   Resp 16   Ht 5\' 3"  (1.6 m)   Wt 77.1 kg   SpO2 94%   BMI 30.11 kg/m    Wt Readings from Last 3 Encounters:  12/24/22 77.1 kg  11/18/22 79.6 kg  09/25/22 79 kg     GEN: No acute distress.   Neck: No JVD Cardiac: RRR, no murmurs, rubs, or gallops.  Respiratory: decreased BS bases  bilaterally. GI: Soft, nontender, non-distended  MS: No edema; No deformity. Neuro:  Nonfocal  Skin: warm and dry Psych: Normal affect    ASSESSMENT AND PLAN:    1.  Paroxysmal atrial fibrillation/typical atrial flutter: Dominique Griffith has presented today for surgery, with the diagnosis of AF.  The various methods of treatment have been discussed with the patient and family. After consideration of risks, benefits and other options for treatment, the patient has consented to  Procedure(s): Catheter ablation as a surgical intervention .  Risks include but not limited to complete heart block, stroke, esophageal damage, nerve damage, bleeding, vascular damage, tamponade, perforation, MI, and death. The patient's history has been reviewed, patient examined, no change in status, stable for surgery.  I have reviewed the patient's chart and labs.  Questions were answered to the patient's satisfaction.    Ariyon Gerstenberger Elberta Fortis, MD 12/24/2022 8:57 AM

## 2022-12-24 NOTE — Anesthesia Preprocedure Evaluation (Addendum)
Anesthesia Evaluation  Patient identified by MRN, date of birth, ID band Patient awake    Reviewed: Allergy & Precautions, NPO status , Patient's Chart, lab work & pertinent test results  History of Anesthesia Complications (+) history of anesthetic complications (Post-op cognitive dysfunction)  Airway Mallampati: II  TM Distance: >3 FB Neck ROM: Full    Dental  (+) Dental Advisory Given, Teeth Intact   Pulmonary former smoker   Pulmonary exam normal breath sounds clear to auscultation       Cardiovascular hypertension, Pt. on medications Normal cardiovascular exam Rhythm:Regular Rate:Normal     Neuro/Psych  PSYCHIATRIC DISORDERS Anxiety Depression    negative neurological ROS     GI/Hepatic Neg liver ROS,GERD  Medicated,,  Endo/Other  Obesity   Renal/GU negative Renal ROS     Musculoskeletal  (+) Arthritis ,    Abdominal   Peds  Hematology  (+) Blood dyscrasia (Eliquis)   Anesthesia Other Findings Day of surgery medications reviewed with the patient.  Reproductive/Obstetrics                             Anesthesia Physical Anesthesia Plan  ASA: 3  Anesthesia Plan: General   Post-op Pain Management: Tylenol PO (pre-op)*   Induction: Intravenous  PONV Risk Score and Plan: 3 and Dexamethasone, Ondansetron and TIVA  Airway Management Planned: Oral ETT  Additional Equipment:   Intra-op Plan:   Post-operative Plan: Extubation in OR  Informed Consent: I have reviewed the patients History and Physical, chart, labs and discussed the procedure including the risks, benefits and alternatives for the proposed anesthesia with the patient or authorized representative who has indicated his/her understanding and acceptance.     Dental advisory given  Plan Discussed with: CRNA  Anesthesia Plan Comments:        Anesthesia Quick Evaluation

## 2022-12-24 NOTE — Anesthesia Procedure Notes (Signed)
Procedure Name: Intubation Date/Time: 12/24/2022 9:46 AM  Performed by: Yolonda Kida, CRNAPre-anesthesia Checklist: Patient identified, Emergency Drugs available, Suction available and Patient being monitored Patient Re-evaluated:Patient Re-evaluated prior to induction Oxygen Delivery Method: Circle System Utilized Preoxygenation: Pre-oxygenation with 100% oxygen Induction Type: IV induction Ventilation: Mask ventilation without difficulty Laryngoscope Size: Mac and 3 Grade View: Grade I Tube type: Oral Tube size: 7.0 mm Number of attempts: 1 Airway Equipment and Method: Stylet Placement Confirmation: ETT inserted through vocal cords under direct vision, positive ETCO2 and breath sounds checked- equal and bilateral Secured at: 23 cm Tube secured with: Tape Dental Injury: Teeth and Oropharynx as per pre-operative assessment

## 2022-12-24 NOTE — Discharge Instructions (Signed)

## 2022-12-24 NOTE — Progress Notes (Signed)
Patient walked to the bathroom without difficulties. Bilateral groin sites level 0, clean, dry, and intact.

## 2022-12-24 NOTE — Transfer of Care (Signed)
Immediate Anesthesia Transfer of Care Note  Patient: Dominique Griffith  Procedure(s) Performed: ATRIAL FIBRILLATION ABLATION  Patient Location: PACU  Anesthesia Type:General  Level of Consciousness: drowsy  Airway & Oxygen Therapy: Patient Spontanous Breathing and Patient connected to nasal cannula oxygen  Post-op Assessment: Report given to RN and Post -op Vital signs reviewed and stable  Post vital signs: Reviewed and stable  Last Vitals:  Vitals Value Taken Time  BP 105/62 1133  Temp    Pulse 69   Resp    SpO2 96     Last Pain:  Vitals:   12/24/22 0829  TempSrc:   PainSc: 0-No pain         Complications: There were no known notable events for this encounter.

## 2022-12-24 NOTE — Anesthesia Postprocedure Evaluation (Signed)
Anesthesia Post Note  Patient: Dominique Griffith  Procedure(s) Performed: ATRIAL FIBRILLATION ABLATION     Patient location during evaluation: PACU Anesthesia Type: General Level of consciousness: awake and alert Pain management: pain level controlled Vital Signs Assessment: post-procedure vital signs reviewed and stable Respiratory status: spontaneous breathing, nonlabored ventilation, respiratory function stable and patient connected to nasal cannula oxygen Cardiovascular status: blood pressure returned to baseline and stable Postop Assessment: no apparent nausea or vomiting Anesthetic complications: no   There were no known notable events for this encounter.  Last Vitals:  Vitals:   12/24/22 1205 12/24/22 1227  BP: 132/64 135/71  Pulse: 69 70  Resp: 13 14  Temp: 36.6 C   SpO2: 96% 91%    Last Pain:  Vitals:   12/24/22 1205  TempSrc: Oral  PainSc: 3                  Collene Schlichter

## 2022-12-25 LAB — POCT ACTIVATED CLOTTING TIME: Activated Clotting Time: 435 s

## 2022-12-27 NOTE — Progress Notes (Signed)
Subjective: Chief Complaint  Patient presents with   Arthritis    PATIENT STATES SHE IS IN A LOT OF PAIN IN RF ALSO IN LEFT BUT NOT AS BAD PATIENT TAKES ALIVE FOR PAIN     82 year old female presents the office today for follow-up evaluation of capsulitis, hallux rigidus on the right side.  She is requesting another injection today.  No recent injury or other changes.  Objective: AAO x3, NAD DP/PT pulses palpable bilaterally, CRT less than 3 seconds Decreased range of motion of the right first MPJ and dorsal spurring is present.  Tenderness noted on the first MPJ encompassing the joint.  There is some edema present there is no erythema or warmth.  Crepitation with range of motion. No pain with calf compression, swelling, warmth, erythema  Assessment: Capsulitis, hallux rigidus right side  Plan: -All treatment options discussed with the patient including all alternatives, risks, complications.  -X-rays obtained reviewed.  3 views of obtained.  Arthritic changes present the first MPJ.  Did not significantly change compared to prior x-rays. -Discussed the conservative as well as surgical treatment options.  She wants to proceed with another injection, conservative treatment for now. -Steroid injection performed today.  Skin was cleaned Betadine and a mixture 1 cc Kenalog 10, 0.5 cc of Marcaine plain, 0.5 cc of lidocaine plain was infiltrated into and around the first MPJ without complications.  Postinjection care discussed.  Tolerated the procedure well. -Continue with supportive shoe gear and shoes with a stiffer sole.  Vivi Barrack DPM

## 2022-12-28 ENCOUNTER — Other Ambulatory Visit: Payer: Self-pay

## 2022-12-28 MED ORDER — MIRABEGRON ER 25 MG PO TB24
25.0000 mg | ORAL_TABLET | Freq: Every day | ORAL | 1 refills | Status: DC
  Filled 2022-12-28: qty 30, 30d supply, fill #0

## 2022-12-29 ENCOUNTER — Encounter: Payer: Self-pay | Admitting: Cardiovascular Disease

## 2022-12-30 ENCOUNTER — Other Ambulatory Visit: Payer: Self-pay

## 2022-12-31 ENCOUNTER — Encounter (INDEPENDENT_AMBULATORY_CARE_PROVIDER_SITE_OTHER): Payer: PPO | Admitting: Cardiology

## 2022-12-31 DIAGNOSIS — G4733 Obstructive sleep apnea (adult) (pediatric): Secondary | ICD-10-CM

## 2023-01-01 ENCOUNTER — Ambulatory Visit: Payer: PPO | Admitting: Cardiovascular Disease

## 2023-01-08 ENCOUNTER — Ambulatory Visit: Payer: PPO | Attending: Cardiology

## 2023-01-08 DIAGNOSIS — R400 Somnolence: Secondary | ICD-10-CM

## 2023-01-08 DIAGNOSIS — R0683 Snoring: Secondary | ICD-10-CM

## 2023-01-08 NOTE — Procedures (Signed)
SLEEP STUDY REPORT Patient Information Study Date: 12/31/2022 Patient Name: Dominique Griffith Patient ID: [id number] Birth Date: 1940/07/01 Age: 82 Gender: Female BMI: 30.9 (W=174 lb, H=5' 3'') Stopbang: 5 Referring Physician: Loman Brooklyn, MD  TEST DESCRIPTION: Home sleep apnea testing was completed using the WatchPat, a Type 1 device, utilizing  peripheral arterial tonometry (PAT), chest movement, actigraphy, pulse oximetry, pulse rate, body position and snore.  AHI was calculated with apnea and hypopnea using valid sleep time as the denominator. RDI includes apneas,  hypopneas, and RERAs. The data acquired and the scoring of sleep and all associated events were performed in  accordance with the recommended standards and specifications as outlined in the AASM Manual for the Scoring of  Sleep and Associated Events 2.2.0 (2015).   FINDINGS:   1. Mild Obstructive Sleep Apnea with AHI 8/hr.   2. No Central Sleep Apnea with pAHI 0.1/hr.   3. Oxygen desaturations as low as 82%.   4. Severe snoring was present. O2 sats were < 88% for 0.4 min.   5. Total sleep time was 8 hrs and 1 min.   6. 14.5% of total sleep time was spent in REM sleep.   7. Shortened sleep onset latency at 6 min.   8. Prolonged REM sleep onset latency at 289 min.   9. Total awakenings were 11.  10. Arrhythmia detection: Suggestive of possible brief atrial fibrillation lasting 52 min and 58 sec seconds. This is not  diagnostic and further testing with outpatient telemetry monitoring is recommended.  DIAGNOSIS: Mild Obstructive Sleep Apnea (G47.33) Possible Atrial Fibrillation  RECOMMENDATIONS: 1. Clinical correlation of these findings is necessary. The decision to treat obstructive sleep apnea (OSA) is usually  based on the presence of apnea symptoms or the presence of associated medical conditions such as Hypertension,  Congestive Heart Failure, Atrial Fibrillation or Obesity. The most common symptoms of OSA  are snoring, gasping for  breath while sleeping, daytime sleepiness and fatigue.   2. Initiating apnea therapy is recommended given the presence of symptoms and/or associated conditions.  Recommend proceeding with one of the following:   a. Auto-CPAP therapy with a pressure range of 5-20cm H2O.   b. An oral appliance (OA) that can be obtained from certain dentists with expertise in sleep medicine. These are  primarily of use in non-obese patients with mild and moderate disease.   c. An ENT consultation which may be useful to look for specific causes of obstruction and possible treatment  options.   d. If patient is intolerant to PAP therapy, consider referral to ENT for evaluation for hypoglossal nerve stimulator.   3. Close follow-up is necessary to ensure success with CPAP or oral appliance therapy for maximum benefit .  4. A follow-up oximetry study on CPAP is recommended to assess the adequacy of therapy and determine the need  for supplemental oxygen or the potential need for Bi-level therapy. An arterial blood gas to determine the adequacy of  baseline ventilation and oxygenation should also be considered.  5. Healthy sleep recommendations include: adequate nightly sleep (normal 7-9 hrs/night), avoidance of caffeine after  noon and alcohol near bedtime, and maintaining a sleep environment that is cool, dark and quiet.  6. Weight loss for overweight patients is recommended. Even modest amounts of weight loss can significantly  improve the severity of sleep apnea.  7. Snoring recommendations include: weight loss where appropriate, side sleeping, and avoidance of alcohol before  bed.  8. Operation of motor vehicle  should be avoided when sleepy.  Signature: Armanda Magic, MD; [facility]; Diplomat, American Board of Sleep  Medicine Electronically Signed: 01/08/2023 8:57:18 AM

## 2023-01-11 ENCOUNTER — Other Ambulatory Visit: Payer: Self-pay

## 2023-01-11 MED ORDER — SOLIFENACIN SUCCINATE 5 MG PO TABS
5.0000 mg | ORAL_TABLET | Freq: Every day | ORAL | 1 refills | Status: DC
  Filled 2023-01-11: qty 90, 90d supply, fill #0

## 2023-01-12 ENCOUNTER — Other Ambulatory Visit: Payer: Self-pay

## 2023-01-15 ENCOUNTER — Ambulatory Visit (HOSPITAL_COMMUNITY)
Admission: RE | Admit: 2023-01-15 | Discharge: 2023-01-15 | Disposition: A | Discharge status: Home / Self Care | Payer: PPO | Source: Ambulatory Visit | Attending: Physician Assistant | Admitting: Physician Assistant

## 2023-01-15 ENCOUNTER — Encounter (HOSPITAL_COMMUNITY): Payer: Self-pay | Admitting: Physician Assistant

## 2023-01-15 VITALS — BP 136/90 | HR 71 | Ht 63.0 in | Wt 172.2 lb

## 2023-01-15 DIAGNOSIS — D6869 Other thrombophilia: Secondary | ICD-10-CM | POA: Insufficient documentation

## 2023-01-15 DIAGNOSIS — I700 Atherosclerosis of aorta: Secondary | ICD-10-CM | POA: Insufficient documentation

## 2023-01-15 DIAGNOSIS — G4733 Obstructive sleep apnea (adult) (pediatric): Secondary | ICD-10-CM | POA: Insufficient documentation

## 2023-01-15 DIAGNOSIS — I480 Paroxysmal atrial fibrillation: Secondary | ICD-10-CM | POA: Diagnosis not present

## 2023-01-15 DIAGNOSIS — Z7901 Long term (current) use of anticoagulants: Secondary | ICD-10-CM | POA: Diagnosis not present

## 2023-01-15 DIAGNOSIS — I10 Essential (primary) hypertension: Secondary | ICD-10-CM | POA: Insufficient documentation

## 2023-01-15 DIAGNOSIS — I4892 Unspecified atrial flutter: Secondary | ICD-10-CM | POA: Diagnosis not present

## 2023-01-15 NOTE — Progress Notes (Signed)
Primary Care Physician: Renford Dills, MD Primary Cardiologist: Thurmon Fair, MD Electrophysiologist: Will Jorja Loa, MD  Referring Physician: Dr Alfonse Ras Dominique Griffith is a 82 y.o. female with a history of HTN, OSA, aortic atherosclerosis, atrial flutter, atrial fibrillation who presents for follow up in the [facility]. The patient wore a cardiac monitor summer 2024 which showed several episodes of paroxysmal atrial flutter generally with borderline controlled ventricular rate (100-110s), followed by postconversion pauses longest 5 seconds in duration. Patient is on Eliquis for a CHADS2VASC score of 5. She was seen by Dr Elberta Fortis and underwent afib and flutter ablation on 12/24/22.  On follow up today, patient reports that she did have some afib episodes right after ablation but these have resolved. She does have intermittent dizziness with change of position/bending over. She also notes that her stamina has not returned to baseline.   Today, she denies symptoms of palpitations, chest pain, shortness of breath, orthopnea, PND, lower extremity edema, dizziness, presyncope, syncope, snoring, daytime somnolence, bleeding, or neurologic sequela. The patient is tolerating medications without difficulties and is otherwise without complaint today.    Atrial Fibrillation Risk Factors:  she does have symptoms or diagnosis of sleep apnea. she does not have a history of rheumatic fever.   Atrial Fibrillation Management history:  Previous antiarrhythmic drugs: none Previous cardioversions: none Previous ablations: none Anticoagulation history: Eliquis   ROS- All systems are reviewed and negative except as per the HPI above.  Past Medical History:  Diagnosis Date   Anxiety    Complication of anesthesia    BACK SURGERY IN 2010; WAS PUT UNDER GENERAL ANESTHESIA ; REPORTS MEMORY LOSS AND CHANGE IN BEHAVIOR FOR 2 MONTHS POST-OP ; STILL OCCASIONALLY HAS  TIMES SHE FORGETS THINGS    Depression    GERD (gastroesophageal reflux disease)    Hypertension    Syncope and collapse    6 MONTHS AGO HAD SYNCOPE AND COLLAPSE ; SAW CARDIOLOGIST;  HAD UNREMARKABLE ECHO ;     Current Outpatient Medications  Medication Sig Dispense Refill   amLODipine (NORVASC) 2.5 MG tablet Take 1 tablet (2.5 mg total) by mouth daily. 90 tablet 3   apixaban (ELIQUIS) 5 MG TABS tablet Take 1 tablet (5 mg total) by mouth 2 (two) times daily. 180 tablet 3   baclofen (LIORESAL) 10 MG tablet Take 1 tablet (10 mg total) by mouth 3 (three) times daily. (Patient taking differently: Take 10 mg by mouth 3 (three) times daily as needed for muscle spasms.) 270 tablet 3   cetirizine (ZYRTEC) 10 MG tablet Take 10 mg by mouth daily.     clindamycin (CLEOCIN) 300 MG capsule Take 300 mg by mouth See admin instructions. Take prior to dental procedures     diclofenac Sodium (VOLTAREN) 1 % GEL Apply 1 Application topically 4 (four) times daily as needed (joint pain).     diphenhydrAMINE (BENADRYL) 25 mg capsule Take 25 mg by mouth daily as needed for allergies.     DULoxetine (CYMBALTA) 30 MG capsule Take 1 capsule (30 mg total) by mouth daily. 30 capsule 3   famotidine (PEPCID) 40 MG tablet Take 1 tablet (40 mg total) by mouth every evening. 90 tablet 3   losartan (COZAAR) 100 MG tablet Take 1 tablet (100 mg total) by mouth daily for blood pressure. 90 tablet 3   mirabegron ER (MYRBETRIQ) 25 MG TB24 tablet Take 1 tablet (25 mg total) by mouth daily. 90 tablet 1  Misc Natural Products (GLUCOSAMINE CHOND MSM FORMULA PO) Take 1 tablet by mouth daily.     Multiple Vitamins-Minerals (WOMENS DAILY FORMULA) TABS Take 1 tablet by mouth daily.     olopatadine (PATANOL) 0.1 % ophthalmic solution Place 1 drop into both eyes 2 (two) times daily as needed for allergies.     Polyethyl Glycol-Propyl Glycol (SYSTANE) 0.4-0.3 % SOLN Place 1 drop into both eyes daily as needed (dry eyes).     solifenacin  (VESICARE) 5 MG tablet Take 1 tablet (5 mg total) by mouth daily. 90 tablet 1   TART CHERRY ULTRA PO Take 1 capsule by mouth daily. With Turmeric     No current facility-administered medications for this encounter.    Physical Exam: BP (!) 136/90   Pulse 71   Ht 5\' 3"  (1.6 m)   Wt 78.1 kg   BMI 30.50 kg/m   GEN: Well nourished, well developed in no acute distress NECK: No JVD; No carotid bruits CARDIAC: Regular rate and rhythm, no murmurs, rubs, gallops RESPIRATORY:  Clear to auscultation without rales, wheezing or rhonchi  ABDOMEN: Soft, non-tender, non-distended EXTREMITIES:  No edema; No deformity   Wt Readings from Last 3 Encounters:  01/15/23 78.1 kg  12/24/22 77.1 kg  11/18/22 79.6 kg     EKG today demonstrates  SR Vent. rate 71 BPM PR interval 166 ms QRS duration 70 ms QT/QTcB 386/419 ms  Echo 02/22/17 demonstrated  - Left ventricle: The cavity size was normal. There was mild    concentric hypertrophy. Systolic function was vigorous. The    estimated ejection fraction was in the range of 65% to 70%. Wall    motion was normal; there were no regional wall motion    abnormalities. Doppler parameters are consistent with abnormal    left ventricular relaxation (grade 1 diastolic dysfunction).    Doppler parameters are consistent with high ventricular filling    pressure.  - Aortic valve: Transvalvular velocity was within the normal range.    There was no stenosis. There was mild regurgitation.  - Mitral valve: Transvalvular velocity was within the normal range.    There was no evidence for stenosis. There was trivial    regurgitation.  - Right ventricle: The cavity size was normal. Wall thickness was    normal. Systolic function was normal.    CHA2DS2-VASc Score = 5  The patient's score is based upon: CHF History: 0 HTN History: 1 Diabetes History: 0 Stroke History: 0 Vascular Disease History: 1 (aortic atherosclerosis) Age Score: 2 Gender Score: 1        ASSESSMENT AND PLAN: Paroxysmal Atrial Fibrillation/atrial flutter The patient's CHA2DS2-VASc score is 5, indicating a 7.2% annual risk of stroke.   S/p afib and flutter ablation 12/24/22 Patient appears to be maintaining SR Continue Eliquis 5 mg BID with no missed doses for 3 months post ablation Apple watch for home monitoring   Secondary Hypercoagulable State (ICD10:  D68.69) The patient is at significant risk for stroke/thromboembolism based upon her CHA2DS2-VASc Score of 5.  Continue Apixaban (Eliquis).   HTN Stable on current regimen  OSA  Sleep study 12/31/22 showed mild OSA. The importance of adequate treatment of sleep apnea was discussed today in order to improve our ability to maintain sinus rhythm long term.     Follow up with Francis Dowse as scheduled.        Jorja Loa PA-C [facility] [address] [place], [place] [address] [phone]

## 2023-01-18 ENCOUNTER — Telehealth: Payer: Self-pay | Admitting: *Deleted

## 2023-01-18 DIAGNOSIS — I10 Essential (primary) hypertension: Secondary | ICD-10-CM

## 2023-01-18 DIAGNOSIS — R0683 Snoring: Secondary | ICD-10-CM

## 2023-01-18 DIAGNOSIS — G4733 Obstructive sleep apnea (adult) (pediatric): Secondary | ICD-10-CM

## 2023-01-18 DIAGNOSIS — R400 Somnolence: Secondary | ICD-10-CM

## 2023-01-18 NOTE — Telephone Encounter (Signed)
auto CPAP from 4-15cm H2O with heated humidity and mask of choice. Order overnight pulse ox on CPAP. Followup with me in 6 weeks.   Upon patient request DME selection is [facility] Patient understands he will be contacted by [facility] to set up his cpap. Patient understands to call if [facility] does not contact him with new setup in a timely manner. Patient understands they will be called once confirmation has been received from [facility] that they have received their new machine to schedule 10 week follow up appointment.   [facility] notified of new cpap order  Please add to airview Patient was grateful for the call and thanked me.

## 2023-01-18 NOTE — Telephone Encounter (Signed)
-----   Message from Armanda Magic sent at 01/08/2023  8:58 AM EST ----- Please let patient know that they have sleep apnea and recommend treating with CPAP.  Please order an auto CPAP from 4-15cm H2O with heated humidity and mask of choice.  Order overnight pulse ox on CPAP.  Followup with me in 6 weeks.

## 2023-01-20 ENCOUNTER — Ambulatory Visit (HOSPITAL_COMMUNITY): Payer: PPO | Admitting: Physician Assistant

## 2023-02-08 ENCOUNTER — Other Ambulatory Visit: Payer: Self-pay

## 2023-02-09 DIAGNOSIS — N3941 Urge incontinence: Secondary | ICD-10-CM | POA: Diagnosis not present

## 2023-02-09 DIAGNOSIS — N83291 Other ovarian cyst, right side: Secondary | ICD-10-CM | POA: Diagnosis not present

## 2023-02-09 DIAGNOSIS — N83209 Unspecified ovarian cyst, unspecified side: Secondary | ICD-10-CM | POA: Diagnosis not present

## 2023-02-10 ENCOUNTER — Other Ambulatory Visit: Payer: Self-pay

## 2023-02-15 ENCOUNTER — Other Ambulatory Visit: Payer: Self-pay

## 2023-02-15 MED ORDER — DULOXETINE HCL 30 MG PO CPEP
30.0000 mg | ORAL_CAPSULE | Freq: Every day | ORAL | 3 refills | Status: DC
  Filled 2023-03-11: qty 90, 90d supply, fill #0
  Filled 2023-05-12 – 2023-05-18 (×2): qty 90, 90d supply, fill #1

## 2023-02-26 ENCOUNTER — Other Ambulatory Visit: Payer: Self-pay

## 2023-03-11 ENCOUNTER — Other Ambulatory Visit: Payer: Self-pay

## 2023-03-12 ENCOUNTER — Other Ambulatory Visit: Payer: Self-pay

## 2023-03-26 ENCOUNTER — Other Ambulatory Visit: Payer: Self-pay

## 2023-03-28 NOTE — Progress Notes (Unsigned)
Cardiology Office Note:  .   Date:  03/28/2023  ID:  Dominique Griffith, DOB Jan 28, 1941, MRN [id number] PCP: Renford Dills, MD   [facility] Providers Cardiologist:  Thurmon Fair, MD Electrophysiologist:  Will Jorja Loa, MD {  History of Present Illness: .   Dominique Griffith is a 83 y.o. female w/PMHx of HTN, arthritis, pseudogout, AFib  She saw Dr. Royann Shivers Aug 2024, had been found with Afib (he felt AFlutter/post termination pauses Hx of syncope back to 2018, rported subsequent woozy spells DOE with longer walks, not ADLs Planned for coronary eval via CT and EP evaluation  Saw Dr. Elberta Fortis 11/18/22, planned for ablation  PVI/CTI ablation 12/24/22  AFib clinic 01/15/23, feeling well, better stamina, no procedural complications, no changes made  Today's visit is scheduled as her post ablation visit  ROS:   She is doing well Does not think she has had AFib, but yesterday felt fast HR, looked at her watch said 120. She got out of the car to take care of her errand she was on and didn't think about it again, doesn't think it last long No CP No SOB but on occasion feels like she has to take ion a deep breath No near syncope or syncope Infrequently feels slightly lightheaded No bleeding or signs of bleeding Denies any groin healing issues or concerns   Arrhythmia/AAD hx AFib/flutter found Aug 2024 PVI (PFA)/CTI ablation 12/24/22  Studies Reviewed: Marland Kitchen    EKG done today and reviewed by myself:  SR, 66bpm, PACs  12/24/22; EPS/ablation CONCLUSIONS: 1. Sinus rhythm upon presentation.   2. Successful electrical isolation and anatomical encircling of all four pulmonary veins with pulsed field ablation. 3. Posterior wall isolation using pulse field ablation 4. Cavotricuspid isthmus ablation 5. No early apparent complications.   10/01/2022: Coronary CT IMPRESSION: 1. Coronary calcium score of 1.37. This was 16 percentile for age and sex matched control. 2. Total  plaque volume (TPV) 70 mm3 which is 16th percentile for age-and sex matched controls (calcified plaque 9 mm3; non-calcified plaque 61 mm3). TPV is mild. 3. Normal coronary origin with right dominance. 4. Mild 30-49% stenosis of large caliber obtuse marginal branch of circumflex artery, non flow limiting. 5. Aortic atherosclerosis CAD-RADS 2. Mild non-obstructive CAD (25-49%). Consider non-atherosclerotic causes of chest pain. Consider preventive therapy and risk factor modification. Over-read IMPRESSION: 1. 8 mm left lower lobe pulmonary nodule. Non-contrast chest CT at 6-12 months is recommended. If the nodule is stable at time of repeat CT, then future CT at 18-24 months (from today's scan) is considered optional for low-risk patients, but is recommended for high-risk patients. This recommendation follows the consensus statement: Guidelines for Management of Incidental Pulmonary Nodules Detected on CT Images: From the Fleischner Society 2017; Radiology 2017; 284:228-243. 2. Small right diaphragmatic Bochdalek hernia containing fat.  02/22/2017: TTE - Left ventricle: The cavity size was normal. There was mild    concentric hypertrophy. Systolic function was vigorous. The    estimated ejection fraction was in the range of 65% to 70%. Wall    motion was normal; there were no regional wall motion    abnormalities. Doppler parameters are consistent with abnormal    left ventricular relaxation (grade 1 diastolic dysfunction).    Doppler parameters are consistent with high ventricular filling    pressure.  - Aortic valve: Transvalvular velocity was within the normal range.    There was no stenosis. There was mild regurgitation.  - Mitral valve: Transvalvular velocity was within the  normal range.    There was no evidence for stenosis. There was trivial    regurgitation.  - Right ventricle: The cavity size was normal. Wall thickness was    normal. Systolic function was normal.     Risk Assessment/Calculations:    Physical Exam:   VS:  There were no vitals taken for this visit.   Wt Readings from Last 3 Encounters:  01/15/23 172 lb 3.2 oz (78.1 kg)  12/24/22 170 lb (77.1 kg)  11/18/22 175 lb 6.4 oz (79.6 kg)    GEN: Well nourished, well developed in no acute distress NECK: No JVD; No carotid bruits CARDIAC: RRR, no murmurs, rubs, gallops RESPIRATORY:   CTA b/l without rales, wheezing or rhonchi  ABDOMEN: Soft, non-tender, non-distended EXTREMITIES:  No edema; No deformity   ASSESSMENT AND PLAN: .    paroxysmal AFib AFlutter CHA2DS2Vasc is 4, on Eliquis, appropriately dosed  She asks about Eliquis, discussed with her risk score I would continue it Perhaps with MD input at her next visit is she has a reliable way of monitoring for recurrent AFIB might be up for discussion She does have a watch, not wearing it today She is agreeable to continue her OAC  She will let us know if more, recurrent palpitations/elevated rates    HTN Looks OK Advised to monitor  Tachy-brady Post conversion pauses > now s/p abltaion No symptoms of near syncope or syncope  Secondary hypercoagulable state 2/2 AFib     Dispo: back in 6 mo, sooner if needed  Signed, Sheilah Pigeon, PA-C

## 2023-03-29 ENCOUNTER — Ambulatory Visit: Payer: PPO | Attending: Physician Assistant | Admitting: Physician Assistant

## 2023-03-29 ENCOUNTER — Encounter: Payer: Self-pay | Admitting: Physician Assistant

## 2023-03-29 VITALS — BP 140/78 | HR 66 | Ht 63.0 in | Wt 175.0 lb

## 2023-03-29 DIAGNOSIS — I495 Sick sinus syndrome: Secondary | ICD-10-CM

## 2023-03-29 DIAGNOSIS — I10 Essential (primary) hypertension: Secondary | ICD-10-CM

## 2023-03-29 DIAGNOSIS — I480 Paroxysmal atrial fibrillation: Secondary | ICD-10-CM | POA: Diagnosis not present

## 2023-03-29 DIAGNOSIS — D6869 Other thrombophilia: Secondary | ICD-10-CM

## 2023-03-29 NOTE — Patient Instructions (Signed)
Medication Instructions:   Your physician has requested that you have a carotid duplex. This test is an ultrasound of the carotid arteries in your neck. It looks at blood flow through these arteries that supply the brain with blood. Allow one hour for this exam. There are no restrictions or special instructions. CU   *If you need a refill on your cardiac medications before your next appointment, please call your pharmacy*   Lab Work: NONE ORDERED  TODAY     If you have labs (blood work) drawn today and your tests are completely normal, you will receive your results only by: MyChart Message (if you have MyChart) OR A paper copy in the mail If you have any lab test that is abnormal or we need to change your treatment, we will call you to review the results.    Testing/Procedures:  NONE ORDERED  TODAY      Follow-Up: At [facility], you and your health needs are our priority.  As part of our continuing mission to provide you with exceptional heart care, we have created designated Provider Care Teams.  These Care Teams include your primary Cardiologist (physician) and Advanced Practice Providers (APPs -  Physician Assistants and Nurse Practitioners) who all work together to provide you with the care you need, when you need it.  We recommend signing up for the patient portal called "MyChart".  Sign up information is provided on this After Visit Summary.  MyChart is used to connect with patients for Virtual Visits (Telemedicine).  Patients are able to view lab/test results, encounter notes, upcoming appointments, etc.  Non-urgent messages can be sent to your provider as well.   To learn more about what you can do with MyChart, go to [email or web address].    Your next appointment:   6 month(s)     Provider:    You may see Will Jorja Loa, MD or one of the following Advanced Practice Providers on your designated Care Team:   Francis Dowse, [place]     Other  Instructions

## 2023-04-22 ENCOUNTER — Other Ambulatory Visit: Payer: Self-pay

## 2023-04-22 MED ORDER — ESTRADIOL 0.1 MG/GM VA CREA
TOPICAL_CREAM | VAGINAL | 3 refills | Status: DC
  Filled 2023-04-22: qty 42.5, 100d supply, fill #0
  Filled 2023-07-27: qty 42.5, 100d supply, fill #1
  Filled 2023-11-10: qty 42.5, 100d supply, fill #2

## 2023-04-22 MED ORDER — MIRABEGRON ER 50 MG PO TB24
50.0000 mg | ORAL_TABLET | Freq: Every day | ORAL | 6 refills | Status: DC
  Filled 2023-04-22: qty 30, 30d supply, fill #0
  Filled 2023-05-12 – 2023-05-18 (×2): qty 30, 30d supply, fill #1
  Filled 2023-06-18: qty 30, 30d supply, fill #2
  Filled 2023-07-17: qty 30, 30d supply, fill #3
  Filled 2023-08-13: qty 30, 30d supply, fill #4
  Filled 2023-09-13: qty 30, 30d supply, fill #5
  Filled 2023-10-30: qty 30, 30d supply, fill #6

## 2023-04-23 ENCOUNTER — Other Ambulatory Visit: Payer: Self-pay

## 2023-05-12 ENCOUNTER — Other Ambulatory Visit: Payer: Self-pay

## 2023-05-13 DIAGNOSIS — N83291 Other ovarian cyst, right side: Secondary | ICD-10-CM | POA: Diagnosis not present

## 2023-05-18 ENCOUNTER — Other Ambulatory Visit: Payer: Self-pay

## 2023-05-20 ENCOUNTER — Other Ambulatory Visit: Payer: Self-pay

## 2023-05-28 ENCOUNTER — Ambulatory Visit: Payer: PPO | Attending: Cardiovascular Disease | Admitting: Cardiovascular Disease

## 2023-05-28 ENCOUNTER — Encounter: Payer: Self-pay | Admitting: Cardiovascular Disease

## 2023-05-28 ENCOUNTER — Telehealth: Payer: Self-pay | Admitting: Emergency Medicine

## 2023-05-28 VITALS — BP 130/70 | HR 77 | Ht 63.0 in | Wt 175.8 lb

## 2023-05-28 DIAGNOSIS — I495 Sick sinus syndrome: Secondary | ICD-10-CM

## 2023-05-28 DIAGNOSIS — I10 Essential (primary) hypertension: Secondary | ICD-10-CM

## 2023-05-28 DIAGNOSIS — I700 Atherosclerosis of aorta: Secondary | ICD-10-CM | POA: Diagnosis not present

## 2023-05-28 DIAGNOSIS — I483 Typical atrial flutter: Secondary | ICD-10-CM

## 2023-05-28 DIAGNOSIS — R0989 Other specified symptoms and signs involving the circulatory and respiratory systems: Secondary | ICD-10-CM | POA: Diagnosis not present

## 2023-05-28 DIAGNOSIS — D6869 Other thrombophilia: Secondary | ICD-10-CM | POA: Diagnosis not present

## 2023-05-28 DIAGNOSIS — E7800 Pure hypercholesterolemia, unspecified: Secondary | ICD-10-CM | POA: Diagnosis not present

## 2023-05-28 NOTE — Progress Notes (Signed)
 Cardiology Office Note:  .   Date:  05/28/2023  ID:  Dominique Griffith, DOB [birth year], MRN [id number] PCP: Renford Dills, MD  [facility] Providers Cardiologist:  Thurmon Fair, MD Electrophysiologist:  Will Jorja Loa, MD    History of Present Illness: .   Dominique Griffith is a 83 y.o. female with hypertension, history of arthritis and pseudogout, history of depression referred in consultation by Dr. Nehemiah Settle for atrial fibrillation/atrial flutter.  Dominique Griffith returns in follow-up after undergoing radiofrequency ablation (cavotricuspid isthmus ablation and pulmonary vein isolation by Dr. Elberta Fortis 01/15/2023).  She is feeling very well.  She has occasional brief bursts of tachycardia which her smart watch has reported that 140 bpm, but these appear to be regular.  She is really not been using her smart watch consistently.  Prior to her ablation her monitor showed several episodes of paroxysmal atrial flutter with mild RVR followed by postconversion pauses up to 5 seconds in duration.  She has never had a stroke.  She is still on Eliquis anticoagulation but is interested in eventually discontinuing anticoagulants.  She has good exercise tolerance and has not had recent problems with shortness of breath at rest or with activity.  She does not have chest pain at rest or with activity.  She denies edema and she has not experienced syncope.  She is very concerned about the future risk of stroke.  Previous echocardiogram has shown normal left ventricular systolic function, EF 65 to 70%, mild diastolic dysfunction, no serious valvular abnormalities, normal size of the left and right atrium.  Coronary CT angiogram in August 2024 did show evidence of aortic atherosclerosis and some calcified plaque, but no significant stenoses (30-49% stenosis of large OM) and her coronary calcium score was actually quite low at 16th percentile for age and gender.  She quit smoking 40 years ago and has a  roughly 14-pack-year history of smoking.  She has hypertension that has been generally well treated.  She does not have diabetes mellitus.  There is no strong family history of early onset vascular disease, although her father had his first myocardial infarction in his 60s and died of a heart attack at age 4.  Her mother had a stroke in her mid 55s.   ROS: Occasional GERD symptoms.  Studies Reviewed: Marland Kitchen       Personally reviewed her most recent electrocardiogram from 03/29/2023 showed sinus rhythm with rare PVCs and relatively low QRS voltage, otherwise normal.  EKG Interpretation Date/Time:    Ventricular Rate:    PR Interval:    QRS Duration:    QT Interval:    QTC Calculation:   R Axis:      Text Interpretation:          Coronary CT angiogram 10/01/2022  1. Coronary calcium score of 1.37. This was 16 percentile for age and sex matched control. 2. Total plaque volume (TPV) 70 mm3 which is 16th percentile for age-and sex matched controls (calcified plaque 9 mm3; non-calcified plaque 61 mm3). TPV is mild. 3. Normal coronary origin with right dominance. 4. Mild 30-49% stenosis of large caliber obtuse marginal branch of circumflex artery, non flow limiting. 5. Aortic atherosclerosis   Echocardiogram 2018  - Left ventricle: The cavity size was normal. There was mild    concentric hypertrophy. Systolic function was vigorous. The    estimated ejection fraction was in the range of 65% to 70%. Wall    motion was normal; there were no regional wall motion  abnormalities. Doppler parameters are consistent with abnormal    left ventricular relaxation (grade 1 diastolic dysfunction).    Doppler parameters are consistent with high ventricular filling    pressure.  - Aortic valve: Transvalvular velocity was within the normal range.    There was no stenosis. There was mild regurgitation.  - Mitral valve: Transvalvular velocity was within the normal range.    There was no evidence  for stenosis. There was trivial    regurgitation.  - Right ventricle: The cavity size was normal. Wall thickness was    normal. Systolic function was normal.   Carotid duplex ultrasound 2018:   RIGHT CAROTID ARTERY: Minimal calcified plaque in the bulb. Low resistance internal carotid Doppler pattern.   RIGHT VERTEBRAL ARTERY:  Antegrade.   LEFT CAROTID ARTERY: There is moderate calcified plaque in the bulb. Low resistance internal carotid Doppler pattern is preserved.   LEFT VERTEBRAL ARTERY:  Antegrade.   IMPRESSION: Less than 50% stenosis in the right and left internal carotid arteries. Risk Assessment/Calculations:    CHA2DS2-VASc Score = 5   This indicates a 7.2% annual risk of stroke. The patient's score is based upon: CHF History: 0 HTN History: 1 Diabetes History: 0 Stroke History: 0 Vascular Disease History: 1 (aortic atherosclerosis) Age Score: 2 Gender Score: 1            Physical Exam:   VS:  BP 130/70   Pulse 77   Ht 5\' 3"  (1.6 m)   Wt 175 lb 12.8 oz (79.7 kg)   SpO2 97%   BMI 31.14 kg/m    Wt Readings from Last 3 Encounters:  05/28/23 175 lb 12.8 oz (79.7 kg)  03/29/23 175 lb (79.4 kg)  01/15/23 172 lb 3.2 oz (78.1 kg)     General: Alert, oriented x3, no distress, appears well Head: no evidence of trauma, PERRL, EOMI, no exophtalmos or lid lag, no myxedema, no xanthelasma; normal ears, nose and oropharynx Neck: normal jugular venous pulsations and no hepatojugular reflux; brisk carotid pulses without delay, but with bilateral carotid bruits Chest: clear to auscultation, no signs of consolidation by percussion or palpation, normal fremitus, symmetrical and full respiratory excursions Cardiovascular: normal position and quality of the apical impulse, regular rhythm, normal first and second heart sounds, 1/6 aortic ejection murmur, no diastolic murmurs, rubs or gallops Abdomen: no tenderness or distention, no masses by palpation, no abnormal  pulsatility or arterial bruits, normal bowel sounds, no hepatosplenomegaly Extremities: no clubbing, cyanosis or edema; 2+ radial, ulnar and brachial pulses bilaterally; 2+ right femoral, posterior tibial and dorsalis pedis pulses; 2+ left femoral, posterior tibial and dorsalis pedis pulses; no subclavian or femoral bruits Neurological: grossly nonfocal Psych: Normal mood and affect   ASSESSMENT AND PLAN: .   Atrial flutter: She successfully underwent cavotricuspid isthmus ablation and pulmonary vein isolation.  She has had a few episodes of rapid rates according to her smart phone, but she has not had any sustained arrhythmia.  She is very concerned about future risk of stroke and we discussed options for eventually discontinuing anticoagulants.  If she were to wear her smart watch very consistently that might be satisfactory, but a more reliable way to ascertain maintenance of normal rhythm would be an implantable loop recorder.  She would like to stop the anticoagulant due to the risk of bleeding and also due to its cost.  We reviewed the purpose of the loop recorder, the details of long-term monitoring, potential complications including infection.  She understands  and wants to go ahead with the device. Tachycardia-bradycardia syndrome: She has not had episodes of syncope.  Before her arrhythmia ablation she had significant postconversion pauses up to 5 seconds in duration.  Hopefully with successful arrhythmia suppression we will not have to be talking about a pacemaker anytime soon. Anticoagulation: Appropriate for CHA2DS2-VASc score of 5 (age 74, gender, HTN, aortic atherosclerosis).  So far tolerated without bleeding problems.  She asked about alternatives to anticoagulation should this need to be continued.  We briefly talked about the Watchman device as an alternative for patients who can take short-term anticoagulation but did not want to take long-term anticoagulation.  At this point this does not  appear to be a necessary intervention. Exertional dyspnea/chest pressure: Although she has evidence of vascular disease (carotid atherosclerosis, aortic atherosclerosis), her overall burden of plaque as measured by coronary calcium score is quite low.  There is no evidence of any meaningful coronary abnormalities that explain chest pain or cause shortness of breath.  Symptoms have improved following her ablation. Carotid bruits: She only has mild internal carotid disease, her bruit appears to be related to external carotid stenosis. HLP: With the information for coronary calcium score I think an LDL cholesterol less than 045 is acceptable.  She does have an excellent HDL at 82. HTN: Her blood pressure is currently well-controlled on a combination of low-dose amlodipine, high-dose losartan.  Continue the same medications.         Signed, Thurmon Fair, MD

## 2023-05-28 NOTE — Telephone Encounter (Signed)
 Left message stating that: Scheduled the Loop insertion for 07/01/23 at 12:00 If insurance denies coverage, then we can cancel this appointment.  Left call back number for any questions/concerns.  She has already received instructions and CHG wash.   Sent MyChart Message to patient.

## 2023-05-28 NOTE — Patient Instructions (Addendum)
 [facility] at [facility], [address]  [place], [place] [address]  Phone: [phone] Fax: [phone]   You are scheduled for a Loop Recorder Implant on: (date)  This will take place at [address] [facility], [address].    Please arrive at your appointment 15-20 minutes early.   You do not need to be fasting.    The procedure is performed with local anesthesia. You will not receive sedatives nor will an IV be placed.    Wash your chest and neck with the surgical soap the evening before and the morning of your procedure. Please following the washing instructions provided.    As with all surgical implants, there is a small risk of infection. If an infection occurs, the device will be removed. To help reduce the risk, please use the surgical scrub provided. Additional antiseptic precautions will be taken at the time of the procedure.    Please bring your insurance cards.    You will be scheduled for a two week wound check visit with Dr. Royann Shivers. This will be a video visit. If you are unable to do a video visit then you may come into the office.   *Please note that scheduled loop recorder implants may need to be rescheduled if Dr. Royann Shivers has a procedure urgently added on at the hospital     Preparing for the Procedure   Before the procedure, you can play an important role. Because skin is not sterile, your skin needs to be as free of germs as possible. You can reduce the number of germs on your skin by washing with CHG (chlorhexidine gluconate) Soap before the procedure. CHG is an antiseptic cleaner which kills germs and bonds with the skin to continue killing germs even after washing.   Please do not use if you have an allergy to CHG or antibacterial soaps. If your skin becomes reddened/irritated, STOP using the CHG.   DO NOT SHAVE (including legs and underarms) for at least 48 hours prior to first CHG shower. It is OK to shave your face.   Please follow these  instructions carefully: Shower the night before the procedure and the morning of with CHG Soap. If you chose to wash your hair, wash your hair first as usual with your normal shampoo/conditioner. After you shampoo/condition, rinse you hair and body thoroughly to remove shampoo/conditioner. Use CHG as you would any other liquid soap. You can apply CHG directly to the skin and wash gently with a loofah or a clean washcloth. Apply the CHG Soap to your body ONLY FROM THE NECK DOWN. Do not use on open wounds or open sores. Avoid contact with your eyes, ears, mouth, and genitals (private parts).  Wash thoroughly, paying special attention to the area where your surgery will be performed. Thoroughly rinse your body with warm water from the neck down. DO NOT shower/wash with your normal soap after using and rinsing off the CHG Soap. Pat yourself dry with a clean towel. Wear clean pajamas to bed. Place clean sheets on your bed the night of your first shower and do not sleep with pets..   Day of Surgery: Shower with the CHG Soap following the instructions listed above. DO NOT apply deodorants or lotions. Wear a Sports administrator  We will call you to Schedule Loop Recorder Insertion  Medication Instructions:  No changes *If you need a refill on your cardiac medications before your next appointment, please call your pharmacy*  Follow-Up: At  [facility], you and your health needs are our priority.  As part of our continuing mission to provide you with exceptional heart care, our providers are all part of one team.  This team includes your primary Cardiologist (physician) and Advanced Practice Providers or APPs (Physician Assistants and Nurse Practitioners) who all work together to provide you with the care you need, when you need it.  Your next appointment:    July  18 at 10:40  Provider:   Thurmon Fair, MD     We recommend signing up for the patient portal called "MyChart".  Sign up  information is provided on this After Visit Summary.  MyChart is used to connect with patients for Virtual Visits (Telemedicine).  Patients are able to view lab/test results, encounter notes, upcoming appointments, etc.  Non-urgent messages can be sent to your provider as well.   To learn more about what you can do with MyChart, go to [email or web address].        1st Floor: - Lobby - Registration  - Pharmacy  - Lab - Cafe  2nd Floor: - PV Lab - Diagnostic Testing (echo, CT, nuclear med)  3rd Floor: - Vacant  4th Floor: - TCTS (cardiothoracic surgery) - AFib Clinic - Structural Heart Clinic - Vascular Surgery  - Vascular Ultrasound  5th Floor: - HeartCare Cardiology (general and EP) - Clinical Pharmacy for coumadin, hypertension, lipid, weight-loss medications, and med management appointments    Valet parking services will be available as well.

## 2023-06-16 ENCOUNTER — Telehealth: Payer: Self-pay

## 2023-06-16 DIAGNOSIS — M25551 Pain in right hip: Secondary | ICD-10-CM | POA: Diagnosis not present

## 2023-06-16 DIAGNOSIS — M25561 Pain in right knee: Secondary | ICD-10-CM | POA: Diagnosis not present

## 2023-06-16 NOTE — Telephone Encounter (Signed)
   Pre-operative Risk Assessment    Patient Name: Dominique Griffith  DOB: 10-Jul-1940 MRN: [id number]   Date of last office visit: 05/28/23 Luana Rumple, MD Date of next office visit: 06/28/23 Luana Rumple, MD   Request for Surgical Clearance    Procedure:   RA OVARIAN CYSTECTOMY  Date of Surgery:  Clearance TBD                                Surgeon:  Chrystal Crape, MD Surgeon's Group or Practice Name:  [facility] Phone number:  [phone] Fax number:  [phone]   Type of Clearance Requested:   - Medical  - Pharmacy:  Hold Apixaban  (Eliquis )     Type of Anesthesia:  Not Indicated   Additional requests/questions:    SignedCollin Deal   06/16/2023, 12:01 PM

## 2023-06-16 NOTE — Telephone Encounter (Signed)
 Low risk for surgery. OK to stop anticoagulant without bridging.

## 2023-06-16 NOTE — Telephone Encounter (Signed)
 Patient with diagnosis of aflutter on Eliquis  for anticoagulation.    Procedure: RA OVARIAN CYSTECTOMY  Date of procedure: TBD   CHA2DS2-VASc Score = 5   This indicates a 7.2% annual risk of stroke. The patient's score is based upon: CHF History: 0 HTN History: 1 Diabetes History: 0 Stroke History: 0 Vascular Disease History: 1 Age Score: 2 Gender Score: 1      CrCl 56 ml/min Platelet count 275  Patient has not had an Afib/aflutter ablation within the last 3 months or DCCV within the last 30 days  Per office protocol, patient can hold Eliquis  for 2 days prior to procedure.    **This guidance is not considered finalized until pre-operative APP has relayed final recommendations.**

## 2023-06-16 NOTE — Telephone Encounter (Signed)
   Patient Name: Dominique Griffith  DOB: 1940-06-20 MRN: [id number]  Primary Cardiologist: Luana Rumple, MD  Chart reviewed as part of pre-operative protocol coverage.  Patient was last seen in office on 05/28/2023 by Dr. Alvis Ba.  Per Dr. Alvis Ba, "Low risk for surgery. OK to stop anticoagulant without bridging."  Therefore, given past medical history and time since last visit, based on ACC/AHA guidelines, PERSEPHONIE HEGWOOD is at acceptable risk for the planned procedure without further cardiovascular testing.   Per office protocol, patient can hold Eliquis  for 2 days prior to procedure. Please resume Eliquis  as soon as possible postprocedure, at the discretion of the surgeon.   I will route this recommendation to the requesting party via Epic fax function and remove from pre-op pool.  Please call with questions.  Jude Norton, NP 06/16/2023, 3:58 PM

## 2023-06-17 ENCOUNTER — Other Ambulatory Visit: Payer: Self-pay

## 2023-06-17 DIAGNOSIS — N83209 Unspecified ovarian cyst, unspecified side: Secondary | ICD-10-CM | POA: Diagnosis not present

## 2023-06-17 DIAGNOSIS — R911 Solitary pulmonary nodule: Secondary | ICD-10-CM | POA: Diagnosis not present

## 2023-06-17 DIAGNOSIS — I4892 Unspecified atrial flutter: Secondary | ICD-10-CM | POA: Diagnosis not present

## 2023-06-17 DIAGNOSIS — F322 Major depressive disorder, single episode, severe without psychotic features: Secondary | ICD-10-CM | POA: Diagnosis not present

## 2023-06-17 MED ORDER — SERTRALINE HCL 50 MG PO TABS
50.0000 mg | ORAL_TABLET | Freq: Every day | ORAL | 11 refills | Status: DC
  Filled 2023-06-17: qty 30, 30d supply, fill #0
  Filled 2023-07-27: qty 30, 30d supply, fill #1
  Filled 2023-08-13: qty 30, 30d supply, fill #2

## 2023-06-19 ENCOUNTER — Other Ambulatory Visit: Payer: Self-pay

## 2023-06-20 ENCOUNTER — Other Ambulatory Visit: Payer: Self-pay

## 2023-06-21 ENCOUNTER — Other Ambulatory Visit (HOSPITAL_COMMUNITY): Payer: Self-pay

## 2023-06-21 ENCOUNTER — Other Ambulatory Visit: Payer: Self-pay

## 2023-06-23 ENCOUNTER — Other Ambulatory Visit: Payer: Self-pay

## 2023-07-01 ENCOUNTER — Ambulatory Visit: Attending: Cardiovascular Disease | Admitting: Cardiovascular Disease

## 2023-07-01 DIAGNOSIS — Z95818 Presence of other cardiac implants and grafts: Secondary | ICD-10-CM

## 2023-07-01 DIAGNOSIS — I480 Paroxysmal atrial fibrillation: Secondary | ICD-10-CM | POA: Diagnosis not present

## 2023-07-01 MED ORDER — LIDOCAINE-EPINEPHRINE 1 %-1:100000 IJ SOLN
10.0000 mL | Freq: Once | INTRAMUSCULAR | Status: AC
  Administered 2023-07-01: 10 mL via INTRADERMAL

## 2023-07-01 NOTE — Patient Instructions (Signed)
  Implantable Loop Recorder Placement, Care After This sheet gives you information about how to care for yourself after your procedure. Your health care provider may also give you more specific instructions. If you have problems or questions, contact your health care provider. What can I expect after the procedure? After the procedure, it is common to have: Soreness or discomfort near the incision. Some swelling or bruising near the incision.  Follow these instructions at home: Incision care  Monitor your cardiac device site for redness, swelling, and drainage. Call the device clinic at [phone] if you experience these symptoms or fever/chills.  Keep the large square bandage on your site for 24 hours and then you may remove it yourself. Keep the steri-strips underneath in place.   You may shower after 72 hours / 3 days from your procedure with the steri-strips in place. They will usually fall off on their own, or may be removed after 10 days. Pat dry.   Avoid lotions, ointments, or perfumes over your incision until it is well-healed.  Please do not submerge in water until your site is completely healed.   Your device is MRI compatible.   Remote monitoring is used to monitor your cardiac device from home. This monitoring is scheduled every month by our office. It allows [place] to keep an eye on the function of your device to ensure it is working properly.  If your wound site starts to bleed apply pressure.    For help with the monitor please call Medtronic Monitor Support Specialist directly at [phone].    If you have any questions/concerns please call the device clinic at [phone].  Activity  Return to your normal activities.  General instructions Follow instructions from your health care provider about how to manage your implantable loop recorder and transmit the information. Learn how to activate a recording if this is necessary for your type of device. You may go  through a metal detection gate, and you may let someone hold a metal detector over your chest. Show your ID card if needed. Do not have an MRI unless you check with your health care provider first. Take over-the-counter and prescription medicines only as told by your health care provider. Keep all follow-up visits as told by your health care provider. This is important. Contact a health care provider if: You have redness, swelling, or pain around your incision. You have a fever. You have pain that is not relieved by your pain medicine. You have triggered your device because of fainting (syncope) or because of a heartbeat that feels like it is racing, slow, fluttering, or skipping (palpitations). Get help right away if you have: Chest pain. Difficulty breathing. Summary After the procedure, it is common to have soreness or discomfort near the incision. Change your dressing as told by your health care provider. Follow instructions from your health care provider about how to manage your implantable loop recorder and transmit the information. Keep all follow-up visits as told by your health care provider. This is important. This information is not intended to replace advice given to you by your health care provider. Make sure you discuss any questions you have with your health care provider. Document Released: 01/21/2015 Document Revised: 03/27/2017 Document Reviewed: 03/27/2017 Elsevier Patient Education  2020 [employer].

## 2023-07-01 NOTE — Progress Notes (Signed)
 LOOP RECORDER IMPLANT   Procedure report  Procedure performed:  Loop recorder implantation   Reason for procedure:  Atrial fibrillation management/monitoring after ablation Procedure performed by:  Luana Rumple, MD  Complications:  None  Estimated blood loss:  <5 mL  Medications administered during procedure:  Lidocaine  1% with 1/100,000 epinephrine  20 mL locally Device details:  Medtronic Reveal Linq model number [id number], serial number [id number] Procedure details:  After the risks and benefits of the procedure were discussed the patient provided informed consent. The patient was prepped and draped in usual sterile fashion. Local anesthesia was administered to an area 2 cm to the left of the sternum in the 4th intercostal space. A cutaneous incision was made using the incision tool. The introducer was then used to create a subcutaneous tunnel and carefully deploy the device. Local pressure was held to ensure hemostasis.  The incision was closed with SteriStrips and a sterile dressing was applied.  R waves 0.35 ms  Luana Rumple, MD, [facility] [phone] office [phone] pager 07/01/2023 12:55 PM

## 2023-07-14 ENCOUNTER — Encounter: Payer: Self-pay | Admitting: Cardiovascular Disease

## 2023-07-15 ENCOUNTER — Ambulatory Visit: Attending: Cardiovascular Disease | Admitting: Cardiovascular Disease

## 2023-07-15 ENCOUNTER — Telehealth: Payer: Self-pay

## 2023-07-15 DIAGNOSIS — Z95818 Presence of other cardiac implants and grafts: Secondary | ICD-10-CM

## 2023-07-15 NOTE — Patient Instructions (Addendum)
 Medication Instructions:  Your physician recommends that you continue on your current medications as directed. Please refer to the Current Medication list given to you today.  *If you need a refill on your cardiac medications before your next appointment, please call your pharmacy*  Lab Work: NONE If you have labs (blood work) drawn today and your tests are completely normal, you will receive your results only by: MyChart Message (if you have MyChart) OR A paper copy in the mail If you have any lab test that is abnormal or we need to change your treatment, we will call you to review the results.  Testing/Procedures: NONE  Follow-Up: At [facility], you and your health needs are our priority.  As part of our continuing mission to provide you with exceptional heart care, our providers are all part of one team.  This team includes your primary Cardiologist (physician) and Advanced Practice Providers or APPs (Physician Assistants and Nurse Practitioners) who all work together to provide you with the care you need, when you need it.  Your next appointment:   FOLLOW-UP AS SCHEDULED   We recommend signing up for the patient portal called "MyChart".  Sign up information is provided on this After Visit Summary.  MyChart is used to connect with patients for Virtual Visits (Telemedicine).  Patients are able to view lab/test results, encounter notes, upcoming appointments, etc.  Non-urgent messages can be sent to your provider as well.   To learn more about what you can do with MyChart, go to [email or web address].

## 2023-07-15 NOTE — Progress Notes (Signed)
 Brief telehealth visit for loop recorder wound check 2 weeks after device implantation.  The site has healed well although there is still some bruising in the area.  There is no redness, tenderness, drainage and she has not had fever, chills or other signs of infection.  Discussed remote monitoring protocols.

## 2023-07-17 ENCOUNTER — Other Ambulatory Visit: Payer: Self-pay

## 2023-07-18 ENCOUNTER — Other Ambulatory Visit: Payer: Self-pay

## 2023-07-20 ENCOUNTER — Other Ambulatory Visit: Payer: Self-pay

## 2023-07-21 DIAGNOSIS — R159 Full incontinence of feces: Secondary | ICD-10-CM | POA: Diagnosis not present

## 2023-07-21 DIAGNOSIS — N3281 Overactive bladder: Secondary | ICD-10-CM | POA: Diagnosis not present

## 2023-07-21 DIAGNOSIS — N3946 Mixed incontinence: Secondary | ICD-10-CM | POA: Diagnosis not present

## 2023-07-26 ENCOUNTER — Ambulatory Visit (HOSPITAL_COMMUNITY): Admit: 2023-07-26 | Admitting: Obstetrics and Gynecology

## 2023-07-26 SURGERY — EXCISION, CYST, OVARY, ROBOT-ASSISTED, LAPAROSCOPIC
Anesthesia: General

## 2023-07-28 ENCOUNTER — Other Ambulatory Visit: Payer: Self-pay

## 2023-07-28 DIAGNOSIS — R269 Unspecified abnormalities of gait and mobility: Secondary | ICD-10-CM | POA: Diagnosis not present

## 2023-07-28 DIAGNOSIS — F322 Major depressive disorder, single episode, severe without psychotic features: Secondary | ICD-10-CM | POA: Diagnosis not present

## 2023-07-28 DIAGNOSIS — N83209 Unspecified ovarian cyst, unspecified side: Secondary | ICD-10-CM | POA: Diagnosis not present

## 2023-07-28 DIAGNOSIS — I4892 Unspecified atrial flutter: Secondary | ICD-10-CM | POA: Diagnosis not present

## 2023-07-28 DIAGNOSIS — Z6831 Body mass index (BMI) 31.0-31.9, adult: Secondary | ICD-10-CM | POA: Diagnosis not present

## 2023-07-28 DIAGNOSIS — E663 Overweight: Secondary | ICD-10-CM | POA: Diagnosis not present

## 2023-07-28 MED ORDER — MOUNJARO 2.5 MG/0.5ML ~~LOC~~ SOAJ
2.5000 mg | SUBCUTANEOUS | 11 refills | Status: DC
  Filled 2023-07-28: qty 4, 56d supply, fill #0

## 2023-07-29 ENCOUNTER — Other Ambulatory Visit: Payer: Self-pay

## 2023-08-04 ENCOUNTER — Other Ambulatory Visit: Payer: Self-pay

## 2023-08-04 ENCOUNTER — Ambulatory Visit

## 2023-08-04 DIAGNOSIS — I480 Paroxysmal atrial fibrillation: Secondary | ICD-10-CM

## 2023-08-05 ENCOUNTER — Other Ambulatory Visit: Payer: Self-pay

## 2023-08-05 LAB — CUP PACEART REMOTE DEVICE CHECK
Date Time Interrogation Session: 20250609220612
Implantable Pulse Generator Implant Date: 20250508

## 2023-08-06 ENCOUNTER — Other Ambulatory Visit: Payer: Self-pay

## 2023-08-07 ENCOUNTER — Other Ambulatory Visit: Payer: Self-pay

## 2023-08-12 ENCOUNTER — Other Ambulatory Visit: Payer: Self-pay

## 2023-08-13 ENCOUNTER — Other Ambulatory Visit: Payer: Self-pay

## 2023-08-16 ENCOUNTER — Other Ambulatory Visit: Payer: Self-pay

## 2023-08-16 ENCOUNTER — Ambulatory Visit: Payer: Self-pay | Admitting: Cardiovascular Disease

## 2023-08-16 MED ORDER — SERTRALINE HCL 100 MG PO TABS
100.0000 mg | ORAL_TABLET | Freq: Every day | ORAL | 1 refills | Status: DC
  Filled 2023-08-16: qty 90, 90d supply, fill #0
  Filled 2023-09-13 – 2023-11-10 (×2): qty 90, 90d supply, fill #1

## 2023-08-17 ENCOUNTER — Other Ambulatory Visit: Payer: Self-pay

## 2023-09-02 ENCOUNTER — Ambulatory Visit

## 2023-09-02 DIAGNOSIS — I480 Paroxysmal atrial fibrillation: Secondary | ICD-10-CM | POA: Diagnosis not present

## 2023-09-03 ENCOUNTER — Ambulatory Visit: Attending: Cardiology | Admitting: Cardiology

## 2023-09-03 ENCOUNTER — Encounter: Payer: Self-pay | Admitting: Cardiology

## 2023-09-03 VITALS — BP 118/68 | HR 72 | Ht 63.0 in | Wt 167.0 lb

## 2023-09-03 DIAGNOSIS — I495 Sick sinus syndrome: Secondary | ICD-10-CM | POA: Diagnosis not present

## 2023-09-03 DIAGNOSIS — I480 Paroxysmal atrial fibrillation: Secondary | ICD-10-CM | POA: Diagnosis not present

## 2023-09-03 DIAGNOSIS — I483 Typical atrial flutter: Secondary | ICD-10-CM

## 2023-09-03 LAB — CUP PACEART REMOTE DEVICE CHECK
Date Time Interrogation Session: 20250710220506
Implantable Pulse Generator Implant Date: 20250508

## 2023-09-03 NOTE — Progress Notes (Signed)
   Electrophysiology Office Note:   Date:  09/03/2023  ID:  Dominique, Griffith [birth year], MRN [id number]  Primary Cardiologist: Jerel Balding, MD Primary Heart Failure: None Electrophysiologist: Zuri Lascala Gladis Norton, MD      History of Present Illness:   Dominique Griffith is a 83 y.o. female with h/o hypertension, atrial fibrillation/flutter seen today for acute visit due to atrial fibrillation and pauses.    Patient reports doing well.  Her loop recorder shows a 0.6% burden of atrial fibrillation.  She has had 2 pauses since her loop recorder was implanted, both postconversion pauses.  She has not had syncope, dizziness, lightheadedness associated with these pauses.  She does have episodes of dizziness, but they last for multiple minutes at a time.  She is able to do her daily activities.  She has noted 1 episode of atrial fibrillation, but otherwise is minimally symptomatic.  she denies chest pain, palpitations, dyspnea, PND, orthopnea, nausea, vomiting, dizziness, syncope, edema, weight gain, or early satiety.   Review of systems complete and found to be negative unless listed in HPI.   Device History: Medtronic loop recorder implanted 07/01/2023 for Cryptogenic Stroke  EP Information / Studies Reviewed:    EKG is ordered today. Personal review as below.  EKG Interpretation Date/Time:  Friday September 03 2023 14:45:41 EDT Ventricular Rate:  75 PR Interval:  168 QRS Duration:  80 QT Interval:  380 QTC Calculation: 424 R Axis:   42  Text Interpretation: Normal sinus rhythm Normal ECG When compared with ECG of 29-Mar-2023 11:41, Premature atrial complexes are no longer Present Confirmed by Blakleigh Straw ([id number]) on 09/03/2023 2:52:47 PM     Risk Assessment/Calculations:    CHA2DS2-VASc Score = 5   This indicates a 7.2% annual risk of stroke. The patient's score is based upon: CHF History: 0 HTN History: 1 Diabetes History: 0 Stroke History: 0 Vascular Disease History: 1 Age  Score: 2 Gender Score: 1            Physical Exam:   VS:  BP 118/68 (BP Location: Left Arm, Patient Position: Sitting, Cuff Size: Normal)   Pulse 72   Ht 5' 3 (1.6 m)   Wt 167 lb (75.8 kg)   SpO2 98%   BMI 29.58 kg/m    Wt Readings from Last 3 Encounters:  09/03/23 167 lb (75.8 kg)  05/28/23 175 lb 12.8 oz (79.7 kg)  03/29/23 175 lb (79.4 kg)     GEN: Well nourished, well developed in no acute distress NECK: No JVD; No carotid bruits CARDIAC: Regular rate and rhythm, no murmurs, rubs, gallops RESPIRATORY:  Clear to auscultation without rales, wheezing or rhonchi  ABDOMEN: Soft, non-tender, non-distended EXTREMITIES:  No edema; No deformity   ILR Interrogation- reviewed in detail today,  See PACEART report  ASSESSMENT AND PLAN:    Atrial fibrillation s/p Medtronic Loop recorder Normal device function Post ablation 12/24/2022.  ILR implant shows a 0.6% atrial fibrillation burden. Having postconversion pauses, longest 5 seconds Patient asymptomatic from pauses, Denym Christenberry give her symptom activator and if symptoms occur, we Jeffren Dombek talk about further rhythm control versus pacemaker implant See Pace Art report No changes today  2.  Tachybradycardia syndrome: Loop recorder as above.  3.  Hypertension: Well-controlled      Follow up with EP APP in 6 months  Signed, Embry Huss Gladis Norton, MD

## 2023-09-07 ENCOUNTER — Ambulatory Visit: Payer: Self-pay | Admitting: Cardiovascular Disease

## 2023-09-08 ENCOUNTER — Encounter: Admitting: Cardiology

## 2023-09-08 NOTE — Progress Notes (Signed)
 Cardiology Office Note:  .   Date:  09/08/2023  ID:  Dominique Griffith, DOB [birth year], MRN [id number] PCP: Rexanne Ingle, MD  [facility] Providers Cardiologist:  Jerel Balding, MD Electrophysiologist:  Will Gladis Norton, MD    History of Present Illness: .   Dominique Griffith is a 83 y.o. female with hypertension, history of arthritis and pseudogout, history of depression, paroxysmal atrial fibrillation/atrial flutter s/p ablation (cavotricuspid isthmus ablation and pulmonary vein isolation by Dr. Norton 01/15/2023), with brief episodes of recurrent arrhythmia.  After implantable loop recorder placement we have discovered that she continues to have brief episodes of paroxysmal atrial fibrillation with mild RVR, but also episodes of postconversion pauses lasting up to 5 seconds in duration.  During the last 30 days she has had 4 episodes of paroxysmal atrial tachyarrhythmia, the longest being an episode of atrial fibrillation lasting 4 hours (overall prevalence 1%).  She only had 1 posttermination pause, lasting 5 seconds, that occurred after bedtime on 08/06/2023.  These pauses have been consistently asymptomatic.  She saw Dr. Norton in the office recently and they discussed further management.  The decision was made to forego pacemaker implantation as long as she is asymptomatic.  She remains on Eliquis  anticoagulation.  She has always been very concerned about the risk of future stroke.  Is not had any falls, injuries or serious bleeding problems while on Eliquis .  Overall she feels quite well.  The patient specifically denies any chest pain at rest or with exertion, dyspnea at rest or with exertion, orthopnea, paroxysmal nocturnal dyspnea, syncope, palpitations, focal neurological deficits, intermittent claudication, lower extremity edema, unexplained weight gain, cough, hemoptysis or wheezing.   Previous echocardiogram has shown normal left ventricular systolic function, EF 65 to 70%,  mild diastolic dysfunction, no serious valvular abnormalities, normal size of the left and right atrium.  Coronary CT angiogram in August 2024 did show evidence of aortic atherosclerosis and some calcified plaque, but no significant stenoses (30-49% stenosis of large OM) and her coronary calcium score was actually quite low at 16th percentile for age and gender.  She quit smoking 40 years ago and has a roughly 14-pack-year history of smoking.  She has hypertension that has been generally well treated.  She does not have diabetes mellitus.  There is no strong family history of early onset vascular disease, although her father had his first myocardial infarction in his 72s and died of a heart attack at age 85.  Her mother had a stroke in her mid 42s.   ROS: Occasional GERD symptoms.  Studies Reviewed: [name]       Personally reviewed her most recent electrocardiogram from 03/29/2023 showed sinus rhythm with rare PVCs and relatively low QRS voltage, otherwise normal.  EKG Interpretation Date/Time:    Ventricular Rate:    PR Interval:    QRS Duration:    QT Interval:    QTC Calculation:   R Axis:      Text Interpretation:          Coronary CT angiogram 10/01/2022  1. Coronary calcium score of 1.37. This was 16 percentile for age and sex matched control. 2. Total plaque volume (TPV) 70 mm3 which is 16th percentile for age-and sex matched controls (calcified plaque 9 mm3; non-calcified plaque 61 mm3). TPV is mild. 3. Normal coronary origin with right dominance. 4. Mild 30-49% stenosis of large caliber obtuse marginal branch of circumflex artery, non flow limiting. 5. Aortic atherosclerosis   Echocardiogram 2018  -  Left ventricle: The cavity size was normal. There was mild    concentric hypertrophy. Systolic function was vigorous. The    estimated ejection fraction was in the range of 65% to 70%. Wall    motion was normal; there were no regional wall motion    abnormalities. Doppler  parameters are consistent with abnormal    left ventricular relaxation (grade 1 diastolic dysfunction).    Doppler parameters are consistent with high ventricular filling    pressure.  - Aortic valve: Transvalvular velocity was within the normal range.    There was no stenosis. There was mild regurgitation.  - Mitral valve: Transvalvular velocity was within the normal range.    There was no evidence for stenosis. There was trivial    regurgitation.  - Right ventricle: The cavity size was normal. Wall thickness was    normal. Systolic function was normal.   Carotid duplex ultrasound 2018:   RIGHT CAROTID ARTERY: Minimal calcified plaque in the bulb. Low resistance internal carotid Doppler pattern.   RIGHT VERTEBRAL ARTERY:  Antegrade.   LEFT CAROTID ARTERY: There is moderate calcified plaque in the bulb. Low resistance internal carotid Doppler pattern is preserved.   LEFT VERTEBRAL ARTERY:  Antegrade.   IMPRESSION: Less than 50% stenosis in the right and left internal carotid arteries. Risk Assessment/Calculations:    CHA2DS2-VASc Score = 5   This indicates a 7.2% annual risk of stroke. The patient's score is based upon: CHF History: 0 HTN History: 1 Diabetes History: 0 Stroke History: 0 Vascular Disease History: 1 Age Score: 2 Gender Score: 1         Physical Exam:   VS:  There were no vitals taken for this visit.   Wt Readings from Last 3 Encounters:  09/03/23 167 lb (75.8 kg)  05/28/23 175 lb 12.8 oz (79.7 kg)  03/29/23 175 lb (79.4 kg)     General: Alert, oriented x3, no distress, appears well Head: no evidence of trauma, PERRL, EOMI, no exophtalmos or lid lag, no myxedema, no xanthelasma; normal ears, nose and oropharynx Neck: normal jugular venous pulsations and no hepatojugular reflux; brisk carotid pulses without delay, but with bilateral carotid bruits Chest: clear to auscultation, no signs of consolidation by percussion or palpation, normal fremitus,  symmetrical and full respiratory excursions Cardiovascular: normal position and quality of the apical impulse, regular rhythm, normal first and second heart sounds, 1/6 aortic ejection murmur, no diastolic murmurs, rubs or gallops Abdomen: no tenderness or distention, no masses by palpation, no abnormal pulsatility or arterial bruits, normal bowel sounds, no hepatosplenomegaly Extremities: no clubbing, cyanosis or edema; 2+ radial, ulnar and brachial pulses bilaterally; 2+ right femoral, posterior tibial and dorsalis pedis pulses; 2+ left femoral, posterior tibial and dorsalis pedis pulses; no subclavian or femoral bruits Neurological: grossly nonfocal Psych: Normal mood and affect   ASSESSMENT AND PLAN: .   Atrial fibrillation: She successfully underwent cavotricuspid isthmus ablation and pulmonary vein isolation.  Although she still has occasional episodes of atrial fibrillation reported on her loop recorder, these are infrequent and have been asymptomatic.  Antiarrhythmics are not justified and would precipitate the need for pacemaker.   Tachycardia-bradycardia syndrome: She has not had episodes of syncope and although her loop recorder continues to show occasional postconversion pauses up to 5 seconds in duration, she is not aware of these.  As long as she remains asymptomatic we can delay pacemaker implantation.   Anticoagulation: Appropriate for CHA2DS2-VASc score of 5 (age 63, gender, HTN, aortic atherosclerosis).  No bleeding complications.   Exertional dyspnea/chest pressure: This often accompanied previous episodes of paroxysmal atrial fibrillation, but she has not been bothered by these complaints since the ablation.  Although she has evidence of vascular disease (carotid atherosclerosis, aortic atherosclerosis), her overall burden of plaque as measured by coronary calcium score is quite low.   Carotid bruits: She only has mild internal carotid disease, her bruit appears to be related to  external carotid stenosis. HLP: Target LDL less than 100 is acceptable considering her low coronary calcium score.  Most recent LDL was 91. HTN: Will continue the current medications since her blood pressure is well-controlled on a combination of low-dose amlodipine , high-dose losartan .         Patient Instructions  Medication Instructions:  No changes *If you need a refill on your cardiac medications before your next appointment, please call your pharmacy*  Lab Work: None ordered If you have labs (blood work) drawn today and your tests are completely normal, you will receive your results only by: MyChart Message (if you have MyChart) OR A paper copy in the mail If you have any lab test that is abnormal or we need to change your treatment, we will call you to review the results.  Testing/Procedures: None ordered  Follow-Up: At [facility], you and your health needs are our priority.  As part of our continuing mission to provide you with exceptional heart care, our providers are all part of one team.  This team includes your primary Cardiologist (physician) and Advanced Practice Providers or APPs (Physician Assistants and Nurse Practitioners) who all work together to provide you with the care you need, when you need it.  Your next appointment:   1 year(s)  Provider:   Jerel Balding, MD    We recommend signing up for the patient portal called MyChart.  Sign up information is provided on this After Visit Summary.  MyChart is used to connect with patients for Virtual Visits (Telemedicine).  Patients are able to view lab/test results, encounter notes, upcoming appointments, etc.  Non-urgent messages can be sent to your provider as well.   To learn more about what you can do with MyChart, go to [email or web address].          Signed, Jerel Balding, MD

## 2023-09-10 ENCOUNTER — Encounter: Payer: Self-pay | Admitting: Cardiovascular Disease

## 2023-09-10 ENCOUNTER — Ambulatory Visit: Attending: Cardiovascular Disease | Admitting: Cardiovascular Disease

## 2023-09-10 VITALS — BP 114/66 | HR 64 | Ht 63.0 in | Wt 172.2 lb

## 2023-09-10 DIAGNOSIS — I480 Paroxysmal atrial fibrillation: Secondary | ICD-10-CM | POA: Diagnosis not present

## 2023-09-10 DIAGNOSIS — E7800 Pure hypercholesterolemia, unspecified: Secondary | ICD-10-CM

## 2023-09-10 DIAGNOSIS — I10 Essential (primary) hypertension: Secondary | ICD-10-CM | POA: Diagnosis not present

## 2023-09-10 DIAGNOSIS — I495 Sick sinus syndrome: Secondary | ICD-10-CM | POA: Diagnosis not present

## 2023-09-10 DIAGNOSIS — D6869 Other thrombophilia: Secondary | ICD-10-CM

## 2023-09-10 DIAGNOSIS — I6529 Occlusion and stenosis of unspecified carotid artery: Secondary | ICD-10-CM

## 2023-09-10 NOTE — Patient Instructions (Signed)

## 2023-09-13 ENCOUNTER — Other Ambulatory Visit: Payer: Self-pay

## 2023-09-13 ENCOUNTER — Encounter: Payer: Self-pay | Admitting: Podiatry

## 2023-09-13 ENCOUNTER — Ambulatory Visit: Admitting: Podiatry

## 2023-09-13 DIAGNOSIS — M7751 Other enthesopathy of right foot: Secondary | ICD-10-CM | POA: Diagnosis not present

## 2023-09-13 MED ORDER — FAMOTIDINE 40 MG PO TABS
40.0000 mg | ORAL_TABLET | Freq: Every evening | ORAL | 1 refills | Status: AC
  Filled 2023-11-10: qty 90, 90d supply, fill #0
  Filled 2024-02-15: qty 90, 90d supply, fill #1

## 2023-09-13 MED ORDER — TRIAMCINOLONE ACETONIDE 10 MG/ML IJ SUSP
5.0000 mg | Freq: Once | INTRAMUSCULAR | Status: AC
  Administered 2023-09-13: 5 mg via INTRAMUSCULAR

## 2023-09-13 MED ORDER — ELIQUIS 5 MG PO TABS
5.0000 mg | ORAL_TABLET | Freq: Two times a day (BID) | ORAL | 3 refills | Status: AC
  Filled 2023-09-13: qty 60, 30d supply, fill #0
  Filled 2023-11-10: qty 180, 90d supply, fill #0
  Filled 2024-02-15: qty 180, 90d supply, fill #1

## 2023-09-13 NOTE — Progress Notes (Signed)
 Subjective: Chief Complaint  Patient presents with   Injections    Requesting injection right foot 1st MPJ. 5 pain. Non diabetic.   83 year old female presents the office today for follow-up evaluation of capsulitis, hallux rigidus on the right side.  She is requesting another injection today as her pain started to come back a few weeks ago. No injuries   Objective: AAO x3, NAD DP/PT pulses palpable bilaterally, CRT less than 3 seconds Decreased range of motion of the right first MPJ and dorsal spurring is present.  Recurrent tenderness noted on the first MPJ encompassing the joint.  There is some edema present there is no erythema or warmth.  Crepitation with range of motion. No pain with calf compression, swelling, warmth, erythema  Assessment: Capsulitis, hallux rigidus right side  Plan: -All treatment options discussed with the patient including all alternatives, risks, complications.  -Steroid injection performed today. Verbal consent obtained. Skin was cleaned Betadine  and a mixture 1 cc Kenalog  10, 0.5 cc of Marcaine  plain, 0.5 cc of lidocaine  plain was infiltrated into and around the first MPJ without complications.  Postinjection care discussed.  Tolerated the procedure well. -Continue with supportive shoe gear and shoes with a stiffer sole.  Dominique Griffith Fees DPM

## 2023-09-13 NOTE — Patient Instructions (Signed)

## 2023-09-17 ENCOUNTER — Other Ambulatory Visit: Payer: Self-pay

## 2023-09-29 ENCOUNTER — Telehealth: Payer: Self-pay

## 2023-09-29 ENCOUNTER — Encounter: Payer: Self-pay | Admitting: *Deleted

## 2023-09-29 NOTE — Telephone Encounter (Signed)
 Pt called back lvm on DC phone requesting a call back

## 2023-09-29 NOTE — Telephone Encounter (Signed)
 Is a patient of Dr. Francyne although saw Dr. Inocencio in the past for pauses.

## 2023-09-29 NOTE — Telephone Encounter (Signed)
 Alert received from [employer] for Symptom + episode. Event occurred 8/5 @ 11:30, EGM c/w AF followed by 4.85 sec conversion pause, junctional for 6sec, NSR resumed. Symptom with AF/AFL 8/5 @ 10:30, followed by 5.2sec conversion pause.  Pt has previous hx of similar pauses, however has been asymptomatic.   Attempted to contact patient. No answer, left message to call back.

## 2023-09-29 NOTE — Telephone Encounter (Signed)
MyChart message sent to patient to call clinic

## 2023-09-29 NOTE — Telephone Encounter (Signed)
 Called patient back to assess for symptoms   Patient stated they had 3 seperate times that they felt dizzy and lightheaded while sitting down at rest and pushed their symptom activator  Patient denied ever passing out  Patient stated they feel okay today  This RN instructed the patient to continue using her activator for any future events  Patient instructed to sit down on chair or ground if possible, if she is in a standing position to prevent hitting her head on anything should she pass out  Patient verbalized understanding of instructions provided to her by this RN  Routing to [employer] for review, recs, and awareness

## 2023-09-29 NOTE — Telephone Encounter (Signed)
 Sounds like it is probably time for that pacemaker, but will defer that decision to Dr. Inocencio

## 2023-09-30 NOTE — Addendum Note (Signed)
 Addended by: Prestin Munch A on: 09/30/2023 01:11 PM   Modules accepted: Orders

## 2023-09-30 NOTE — Telephone Encounter (Signed)
 Patient has been scheduled for 10/12/23 to see Dr. Inocencio to discuss possible PPM implant.

## 2023-09-30 NOTE — Progress Notes (Signed)
 [facility] Summary Report / Loop Recorder

## 2023-10-04 ENCOUNTER — Ambulatory Visit: Payer: Self-pay | Admitting: Cardiovascular Disease

## 2023-10-04 ENCOUNTER — Ambulatory Visit: Attending: Cardiovascular Disease

## 2023-10-04 DIAGNOSIS — I480 Paroxysmal atrial fibrillation: Secondary | ICD-10-CM

## 2023-10-04 LAB — CUP PACEART REMOTE DEVICE CHECK
Date Time Interrogation Session: 20250810220400
Implantable Pulse Generator Implant Date: 20250508

## 2023-10-12 ENCOUNTER — Ambulatory Visit: Attending: Cardiology | Admitting: Cardiology

## 2023-10-12 ENCOUNTER — Other Ambulatory Visit: Payer: Self-pay

## 2023-10-12 ENCOUNTER — Encounter: Payer: Self-pay | Admitting: Cardiology

## 2023-10-12 VITALS — BP 100/60 | HR 72 | Ht 63.0 in | Wt 171.0 lb

## 2023-10-12 DIAGNOSIS — I10 Essential (primary) hypertension: Secondary | ICD-10-CM

## 2023-10-12 DIAGNOSIS — I483 Typical atrial flutter: Secondary | ICD-10-CM | POA: Diagnosis not present

## 2023-10-12 DIAGNOSIS — D6869 Other thrombophilia: Secondary | ICD-10-CM | POA: Diagnosis not present

## 2023-10-12 DIAGNOSIS — I480 Paroxysmal atrial fibrillation: Secondary | ICD-10-CM

## 2023-10-12 DIAGNOSIS — I495 Sick sinus syndrome: Secondary | ICD-10-CM | POA: Diagnosis not present

## 2023-10-12 MED ORDER — PROPAFENONE HCL ER 225 MG PO CP12
225.0000 mg | ORAL_CAPSULE | Freq: Two times a day (BID) | ORAL | 3 refills | Status: DC
  Filled 2023-10-12: qty 60, 30d supply, fill #0

## 2023-10-12 NOTE — Progress Notes (Signed)
  Electrophysiology Office Note:   Date:  10/12/2023  ID:  Dominique, Griffith [birth year], MRN [id number]  Primary Cardiologist: Jerel Balding, MD Primary Heart Failure: None Electrophysiologist: Eldor Conaway Gladis Norton, MD      History of Present Illness:   Dominique Griffith is a 83 y.o. female with h/o hypertension, atrial fibrillation/flutter seen today for routine electrophysiology followup.   Since last being seen in our clinic the patient reports doing overall well.  She does continue to have episodes of atrial fibrillation making her short of breath and fatigue.  She is having some postconversion pauses and near syncopal episodes.  She was also having dizziness.  When she is in normal rhythm, she is able to do her daily activities and without complaint.  she denies chest pain, palpitations, dyspnea, PND, orthopnea, nausea, vomiting, dizziness, syncope, edema, weight gain, or early satiety.   Review of systems complete and found to be negative unless listed in HPI.   EP Information / Studies Reviewed:    EKG is not ordered today. EKG from 09/03/2023 reviewed which showed sinus rhythm        Risk Assessment/Calculations:    CHA2DS2-VASc Score = 5   This indicates a 7.2% annual risk of stroke. The patient's score is based upon: CHF History: 0 HTN History: 1 Diabetes History: 0 Stroke History: 0 Vascular Disease History: 1 Age Score: 2 Gender Score: 1            Physical Exam:   VS:  BP 100/60 (BP Location: Right Arm, Patient Position: Sitting, Cuff Size: Normal)   Pulse 72   Ht 5' 3 (1.6 m)   Wt 171 lb (77.6 kg)   SpO2 97%   BMI 30.29 kg/m    Wt Readings from Last 3 Encounters:  10/12/23 171 lb (77.6 kg)  09/10/23 172 lb 3.2 oz (78.1 kg)  09/03/23 167 lb (75.8 kg)     GEN: Well nourished, well developed in no acute distress NECK: No JVD; No carotid bruits CARDIAC: Regular rate and rhythm, no murmurs, rubs, gallops RESPIRATORY:  Clear to auscultation without  rales, wheezing or rhonchi  ABDOMEN: Soft, non-tender, non-distended EXTREMITIES:  No edema; No deformity   ASSESSMENT AND PLAN:    1.  Paroxysmal atrial fibrillation/flutter: Post ablation 12/24/2022.  She is continue to have short episodes of atrial fibrillation, up to 6 hours.  She is having postconversion pauses and shortness of breath related to her atrial fibrillation.  We discussed pacemaker implant versus continued efforts at rhythm control.  She would prefer rhythm control for now.  Preethi Scantlebury start propafenone  225 mg twice daily.  If she continues to have pauses, pacemaker implant may be warranted.  She would also be interested potentially in a repeat ablation if necessary.  2.  Secondary hypercoagulable state: Continue Eliquis   3.  Hypertension: Well-controlled  4.  Tachybradycardia syndrome: Post ablation.  Continues to have postconversion pauses.  Rhythm control as above.  Follow up with Afib Clinic in 4 weeks  Signed, Zebedee Segundo Gladis Norton, MD

## 2023-10-12 NOTE — Patient Instructions (Addendum)
 Medication Instructions:  Your physician has recommended you make the following change in your medication:  START Propafenone  225 mg twice a day  *If you need a refill on your cardiac medications before your next appointment, please call your pharmacy*  Lab Work: None ordered  If you have any lab test that is abnormal or we need to change your treatment, we will call you to review the results.  Testing/Procedures: None ordered  Follow-Up: At [facility], you and your health needs are our priority.  As part of our continuing mission to provide you with exceptional heart care, our providers are all part of one team.  This team includes your primary Cardiologist (physician) and Advanced Practice Providers or APPs (Physician Assistants and Nurse Practitioners) who all work together to provide you with the care you need, when you need it.   Your physician recommends that you schedule a follow-up appointment in: 2 weeks for nurse visit EKG, after starting Propafenone    Your next appointment:   1 1/2 - 2 month(s)  Provider:   You will follow up in the Atrial Fibrillation Clinic located at [facility]. Your provider will be: Clint R. Fenton, PA-C or Fairy Heinrich, PA-C     Thank you for choosing [facility]!!   Maeola Domino, RN [phone]   Other Instructions  Propafenone  Tablets What is this medication? PROPAFENONE  (proe pa FEEN one) prevents and treats a fast or irregular heartbeat (arrhythmia). It is often used to treat a type of arrhythmia known as AFib (atrial fibrillation). It works by slowing down overactive electric signals in the heart, which stabilizes your heart rhythm. It belongs to a group of medications called antiarrhythmics. This medicine may be used for other purposes; ask your health care provider or pharmacist if you have questions. COMMON BRAND NAME(S): Rythmol  What should I tell my care team before I take this medication? They need to  know if you have any of these conditions: Brugada syndrome Have had a heart attack Heart failure High or low levels of electrolytes, such as magnesium, potassium, or sodium in your blood Kidney disease Liver disease Low blood pressure Lung or breathing disease, such as asthma or COPD Pacemaker Slow heartbeat An unusual or allergic reaction to propafenone , other medications, foods, dyes, or preservatives Pregnant or trying to get pregnant Breastfeeding How should I use this medication? Take this medication by mouth with water . Take it as directed on the prescription label at the same time every day. You can take it with or without food. If it upsets your stomach, take it with food. Keep taking it unless your care team tells you to stop. Talk to your care team about the use of this medication in children. Special care may be needed. Overdosage: If you think you have taken too much of this medicine contact a poison control center or emergency room at once. NOTE: This medicine is only for you. Do not share this medicine with others. What if I miss a dose? If you miss a dose, take it as soon as you can. If it is almost time for your next dose, take only that dose. Do not take double or extra doses. What may interact with this medication? Do not take this medication with any of the following: Certain medications for fungal infections, such as fluconazole, ketoconazole, posaconazole Certain medications for irregular heartbeat, such as dronedarone Cisapride Idelalisib Nirmatrelvir; ritonavir Pimozide Saquinavir Thioridazine Tipranavir This medication may also interact with the following: Beta blockers,  such as propranolol Digoxin Grapefruit juice Orlistat Warfarin Other medications may affect the way this medication works. Talk with your care team about all the medications you take. They may suggest changes to your treatment plan to lower the risk of side effects and to make sure your  medications work as intended. This list may not describe all possible interactions. Give your health care provider a list of all the medicines, herbs, non-prescription drugs, or dietary supplements you use. Also tell them if you smoke, drink alcohol , or use illegal drugs. Some items may interact with your medicine. What should I watch for while using this medication? Your condition will be monitored closely when you first begin therapy. Often, this medication is first started in a hospital or other monitored health care setting. Once you are on maintenance therapy, visit your care team for regular checks on your progress. Because your condition and use of this medication carry some risk, it is a good idea to carry an identification card, necklace or bracelet with details of your condition, medications, and care team. This medication may affect your coordination, reaction time, or judgment. Do not drive or operate machinery until you know how this medication affects you. Sit up or stand slowly to reduce the risk of dizzy or fainting spells. Drinking alcohol  with this medication can increase the risk of these side effects. If you are going to have surgery, tell your care team that you are taking this medication. What side effects may I notice from receiving this medication? Side effects that you should report to your care team as soon as possible: Allergic reactions--skin rash, itching, hives, swelling of the face, lips, tongue, or throat Heart failure--shortness of breath, swelling of the ankles, feet, or hands, sudden weight gain, unusual weakness or fatigue Heart rhythm changes--fast or irregular heartbeat, dizziness, feeling faint or lightheaded, chest pain, trouble breathing Infection--fever, chills, cough, sore throat Unusual bruising or bleeding Side effects that usually do not require medical attention (report to your care team if they continue or are bothersome): Change in  taste Constipation Dizziness Fatigue Nausea This list may not describe all possible side effects. Call your doctor for medical advice about side effects. You may report side effects to FDA at 1-800-FDA-1088. Where should I keep my medication? Keep out of the reach of children and pets. Store at room temperature between 15 and 30 degrees C (59 and 86 degrees F). Protect from light. Keep container tightly closed. Throw away any unused medication after the expiration date. NOTE: This sheet is a summary. It may not cover all possible information. If you have questions about this medicine, talk to your doctor, pharmacist, or health care provider.  2024 Elsevier/Gold Standard (2022-08-13 00:00:00)

## 2023-10-13 ENCOUNTER — Other Ambulatory Visit: Payer: Self-pay

## 2023-10-20 ENCOUNTER — Encounter: Payer: Self-pay | Admitting: Cardiology

## 2023-10-20 ENCOUNTER — Other Ambulatory Visit: Payer: Self-pay

## 2023-10-20 ENCOUNTER — Encounter (HOSPITAL_COMMUNITY): Payer: Self-pay | Admitting: Internal Medicine

## 2023-10-20 ENCOUNTER — Ambulatory Visit (HOSPITAL_COMMUNITY)
Admission: RE | Admit: 2023-10-20 | Discharge: 2023-10-20 | Disposition: A | Discharge status: Home / Self Care | Source: Ambulatory Visit | Attending: Internal Medicine | Admitting: Internal Medicine

## 2023-10-20 VITALS — BP 92/70 | HR 97 | Ht 63.0 in | Wt 173.2 lb

## 2023-10-20 DIAGNOSIS — Z79899 Other long term (current) drug therapy: Secondary | ICD-10-CM

## 2023-10-20 DIAGNOSIS — D6869 Other thrombophilia: Secondary | ICD-10-CM

## 2023-10-20 DIAGNOSIS — I480 Paroxysmal atrial fibrillation: Secondary | ICD-10-CM | POA: Diagnosis not present

## 2023-10-20 MED ORDER — PROPAFENONE HCL ER 325 MG PO CP12
325.0000 mg | ORAL_CAPSULE | Freq: Two times a day (BID) | ORAL | 3 refills | Status: DC
  Filled 2023-10-20: qty 60, 30d supply, fill #0

## 2023-10-20 MED ORDER — AMLODIPINE BESYLATE 2.5 MG PO TABS: 2.5000 mg | ORAL_TABLET | Freq: Every day | ORAL | Status: DC

## 2023-10-20 NOTE — Telephone Encounter (Signed)
 Forwarding to device clinic to assess rhythm since seeing pt 8/19 (before I reach out to pt)

## 2023-10-20 NOTE — Patient Instructions (Addendum)
 Increase propafenone  to 325mg  twice a day    HOLD amlodipine 

## 2023-10-20 NOTE — Telephone Encounter (Signed)
 Device Transmission Reviewed: (numerous AF events).  Course Afib, fast Ventricular rates - periods of post conversion pause/brady.   Patient saw Dr. Inocencio on 8/19 opted for rhythm control versus PPM placement for now. (See OV note).   Spoke with patient, she is symptomatic with SOB, lightheadedness and dizziness.  Feels AF and fast heart rates since yesterday midday.  Wants to know how to get out of AF? And what to do for symptoms.  BP is borderline hypotensive.   Appointment made with AF clinic for tomorrow 8/28 at 930am with R. Fenton PA to discuss options.  ER precautions given if symptoms become severe before appt.  Patient agrees and verbalizes understanding.

## 2023-10-20 NOTE — Progress Notes (Signed)
 Primary Care Physician: Rexanne Ingle, MD Primary Cardiologist: Jerel Balding, MD Electrophysiologist: Will Gladis Norton, MD  Referring Physician: Dr Norton Mom Dominique Griffith is a 83 y.o. female with a history of HTN, OSA, aortic atherosclerosis, atrial flutter, atrial fibrillation who presents for follow up in the [facility]. The patient wore a cardiac monitor summer 2024 which showed several episodes of paroxysmal atrial flutter generally with borderline controlled ventricular rate (100-110s), followed by postconversion pauses longest 5 seconds in duration. Patient is on Eliquis  for a CHADS2VASC score of 5. She was seen by Dr Norton and underwent afib and flutter ablation on 12/24/22.  On follow up 10/20/23, patient is currently in atrial flutter. Seen by Dr. Norton on 8/19 and noted to have continued episodes of Afib with post-conversion pauses and near syncopal episodes. Patient started on propafenone  225 mg BID. Patient contacted office today noting dizziness yesterday. Device clinic transmission shows ongoing Afib since this morning at 0423 with PAF episodes yesterday. No missed doses of Eliquis  5 mg BID.   Today, she denies symptoms of palpitations, chest pain, shortness of breath, orthopnea, PND, lower extremity edema, dizziness, presyncope, syncope, snoring, daytime somnolence, bleeding, or neurologic sequela. The patient is tolerating medications without difficulties and is otherwise without complaint today.    Atrial Fibrillation Risk Factors:  she does have symptoms or diagnosis of sleep apnea. she does not have a history of rheumatic fever.   Atrial Fibrillation Management history:  Previous antiarrhythmic drugs: propafenone  Previous cardioversions: none Previous ablations: none Anticoagulation history: Eliquis    ROS- All systems are reviewed and negative except as per the HPI above.  Past Medical History:  Diagnosis Date   Anxiety     Complication of anesthesia    BACK SURGERY IN 2010; WAS PUT UNDER GENERAL ANESTHESIA ; REPORTS MEMORY LOSS AND CHANGE IN BEHAVIOR FOR 2 MONTHS POST-OP ; STILL OCCASIONALLY HAS TIMES SHE FORGETS THINGS    Depression    GERD (gastroesophageal reflux disease)    Hypertension    Syncope and collapse    6 MONTHS AGO HAD SYNCOPE AND COLLAPSE ; SAW CARDIOLOGIST;  HAD UNREMARKABLE ECHO ;     Current Outpatient Medications  Medication Sig Dispense Refill   apixaban  (ELIQUIS ) 5 MG TABS tablet Take 1 tablet (5 mg total) by mouth 2 (two) times daily. 180 tablet 3   baclofen  (LIORESAL ) 10 MG tablet Take 1 tablet (10 mg total) by mouth 3 (three) times daily. (Patient taking differently: Take 10 mg by mouth 3 (three) times daily as needed for muscle spasms.) 270 tablet 3   cetirizine (ZYRTEC) 10 MG tablet Take 10 mg by mouth daily.     clindamycin  (CLEOCIN ) 300 MG capsule Take 300 mg by mouth See admin instructions. Take prior to dental procedures     estradiol  (ESTRACE ) 0.1 MG/GM vaginal cream Insert blueberry size amount of cream on finger (or 0.5 grams with applicator) in vagina daily x 2 week then 2 x per week. 42.5 g 3   famotidine  (PEPCID ) 40 MG tablet Take 1 tablet (40 mg total) by mouth every evening. 90 tablet 1   losartan  (COZAAR ) 100 MG tablet Take 1 tablet (100 mg total) by mouth daily for blood pressure. 90 tablet 3   mirabegron  ER (MYRBETRIQ ) 50 MG TB24 tablet Take 1 tablet (50 mg total) by mouth daily. 30 tablet 6   Misc Natural Products (GLUCOSAMINE CHOND MSM FORMULA PO) Take 1 tablet by mouth daily.  Multiple Vitamins-Minerals (WOMENS DAILY FORMULA) TABS Take 1 tablet by mouth daily.     olopatadine (PATANOL) 0.1 % ophthalmic solution Place 1 drop into both eyes 2 (two) times daily as needed for allergies.     Polyethyl Glycol-Propyl Glycol (SYSTANE) 0.4-0.3 % SOLN Place 1 drop into both eyes daily as needed (dry eyes).     sertraline  (ZOLOFT ) 100 MG tablet Take 1 tablet (100 mg total)  by mouth daily. 90 tablet 1   TART CHERRY ULTRA PO Take 1 capsule by mouth daily. With Turmeric     Turmeric (QC TUMERIC COMPLEX) 500 MG CAPS Take by mouth.     amLODipine  (NORVASC ) 2.5 MG tablet Take 1 tablet (2.5 mg total) by mouth daily. HOLD FOR NOW 8/27     propafenone  (RYTHMOL  SR) 325 MG 12 hr capsule Take 1 capsule (325 mg total) by mouth 2 (two) times daily. 60 capsule 3   No current facility-administered medications for this encounter.    Physical Exam: BP 92/70   Pulse 97   Ht 5' 3 (1.6 m)   Wt 78.6 kg   BMI 30.68 kg/m   GEN- The patient is well appearing, alert and oriented x 3 today.   Neck - no JVD or carotid bruit noted Lungs- Clear to ausculation bilaterally, normal work of breathing Heart- Irregular rate and rhythm, no murmurs, rubs or gallops, PMI not laterally displaced Extremities- no clubbing, cyanosis, or edema Skin - no rash or ecchymosis noted   Wt Readings from Last 3 Encounters:  10/20/23 78.6 kg  10/12/23 77.6 kg  09/10/23 78.1 kg     EKG today demonstrates  Vent. rate 97 BPM PR interval * ms QRS duration 76 ms QT/QTcB 358/454 ms P-R-T axes 73 95 51 Atrial flutter with variable A-V block Rightward axis Low voltage QRS Abnormal ECG When compared with ECG of 03-Sep-2023 14:45, Atrial flutter has replaced Sinus rhythm Confirmed by Terra Pac ([id number]) on 10/20/2023 2:35:50 PM  Echo 02/22/17 demonstrated  - Left ventricle: The cavity size was normal. There was mild    concentric hypertrophy. Systolic function was vigorous. The    estimated ejection fraction was in the range of 65% to 70%. Wall    motion was normal; there were no regional wall motion    abnormalities. Doppler parameters are consistent with abnormal    left ventricular relaxation (grade 1 diastolic dysfunction).    Doppler parameters are consistent with high ventricular filling    pressure.  - Aortic valve: Transvalvular velocity was within the normal range.    There was no  stenosis. There was mild regurgitation.  - Mitral valve: Transvalvular velocity was within the normal range.    There was no evidence for stenosis. There was trivial    regurgitation.  - Right ventricle: The cavity size was normal. Wall thickness was    normal. Systolic function was normal.    CHA2DS2-VASc Score = 5  The patient's score is based upon: CHF History: 0 HTN History: 1 Diabetes History: 0 Stroke History: 0 Vascular Disease History: 1 Age Score: 2 Gender Score: 1       ASSESSMENT AND PLAN: Paroxysmal Atrial Fibrillation/atrial flutter The patient's CHA2DS2-VASc score is 5, indicating a 7.2% annual risk of stroke.   S/p afib and flutter ablation 12/24/22  Patient is currently in atrial flutter. Hold amlodipine  due to hypotension. Will increase propafenone  from 225 mg BID to 325 mg BID. Patient has ECG visit already scheduled next week.   High risk  medication monitoring (ICD10: U5195107) Patient requires ongoing monitoring for anti-arrhythmic medication which has the potential to cause life threatening arrhythmias or AV block. ECG intervals are stable. PR interval in NSR 168 ms. Increase propafenone  to 325 mg BID.   Secondary Hypercoagulable State (ICD10:  D68.69) The patient is at significant risk for stroke/thromboembolism based upon her CHA2DS2-VASc Score of 5.  Continue Apixaban  (Eliquis ).  No missed doses.   HTN Hold amlodipine  as above.      Follow up as scheduled for repeat ECG.       Dorn Heinrich, PA-C Afib Clinic [facility] [address] [place], [place] [address] [phone]

## 2023-10-21 ENCOUNTER — Telehealth: Payer: Self-pay

## 2023-10-21 ENCOUNTER — Other Ambulatory Visit: Payer: Self-pay

## 2023-10-21 NOTE — Telephone Encounter (Signed)
 duplicate

## 2023-10-21 NOTE — Telephone Encounter (Signed)
 RN spoke w/ patient to reschedule RN visit for 9.3.25 at 11:00AM; changed to 3:00PM same day and patient verbalized understanding.

## 2023-10-27 ENCOUNTER — Ambulatory Visit

## 2023-10-27 ENCOUNTER — Ambulatory Visit: Attending: Cardiovascular Disease

## 2023-10-27 VITALS — BP 127/78 | HR 66 | Ht 63.0 in | Wt 176.0 lb

## 2023-10-27 DIAGNOSIS — Z79899 Other long term (current) drug therapy: Secondary | ICD-10-CM | POA: Diagnosis not present

## 2023-10-27 NOTE — Progress Notes (Signed)
   Nurse Visit   Date of Encounter: 10/27/2023 ID: Dominique Griffith, DOB 12/18/1940, MRN [id number]  PCP:  Rexanne Ingle, MD   [facility] Providers Cardiologist:  Jerel Balding, MD Electrophysiologist:  Soyla Gladis Norton, MD      Visit Details   VS:  BP 127/78 (BP Location: Right Arm, Patient Position: Sitting)   Pulse 66   Ht 5' 3 (1.6 m)   Wt 176 lb (79.8 kg)   SpO2 92%   BMI 31.18 kg/m  , BMI Body mass index is 31.18 kg/m.  Wt Readings from Last 3 Encounters:  10/27/23 176 lb (79.8 kg)  10/20/23 173 lb 3.2 oz (78.6 kg)  10/12/23 171 lb (77.6 kg)     Reason for visit: EKG post Propafenone  start  Performed today: Vitals, EKG, Provider consulted:Michael Cooper, MD, and Education Changes (medications, testing, etc.) : NONE Length of Visit: 20 minutes    Medications Adjustments/Labs and Tests Ordered: Orders Placed This Encounter  Procedures   EKG 12-Lead   No orders of the defined types were placed in this encounter. Patient states she would like to speak with Dr. Norton about pacemaker. Reports that she hates taking Propafenone . Reports no energy, upset stomach and 5 pound weight gain in the last week. Informed patient Dr. Norton would be notified about her concerns. Note routed to Dr. Norton.    Signed, Con FORBES Guys, RN  10/27/2023 3:43 PM

## 2023-10-28 ENCOUNTER — Encounter (HOSPITAL_COMMUNITY): Payer: Self-pay

## 2023-10-28 ENCOUNTER — Emergency Department (HOSPITAL_COMMUNITY)

## 2023-10-28 ENCOUNTER — Encounter: Payer: Self-pay | Admitting: Cardiology

## 2023-10-28 ENCOUNTER — Other Ambulatory Visit: Payer: Self-pay

## 2023-10-28 ENCOUNTER — Inpatient Hospital Stay (HOSPITAL_COMMUNITY)
Admission: EM | Admit: 2023-10-28 | Discharge: 2023-10-30 | DRG: 243 | Disposition: A | Discharge status: Home / Self Care | Attending: Cardiovascular Disease | Admitting: Cardiovascular Disease

## 2023-10-28 ENCOUNTER — Encounter: Payer: Self-pay | Admitting: Cardiovascular Disease

## 2023-10-28 DIAGNOSIS — I10 Essential (primary) hypertension: Secondary | ICD-10-CM | POA: Diagnosis present

## 2023-10-28 DIAGNOSIS — F419 Anxiety disorder, unspecified: Secondary | ICD-10-CM | POA: Diagnosis present

## 2023-10-28 DIAGNOSIS — Z880 Allergy status to penicillin: Secondary | ICD-10-CM

## 2023-10-28 DIAGNOSIS — I4891 Unspecified atrial fibrillation: Secondary | ICD-10-CM | POA: Diagnosis present

## 2023-10-28 DIAGNOSIS — I495 Sick sinus syndrome: Principal | ICD-10-CM | POA: Diagnosis present

## 2023-10-28 DIAGNOSIS — G459 Transient cerebral ischemic attack, unspecified: Secondary | ICD-10-CM | POA: Diagnosis not present

## 2023-10-28 DIAGNOSIS — Z823 Family history of stroke: Secondary | ICD-10-CM

## 2023-10-28 DIAGNOSIS — R29818 Other symptoms and signs involving the nervous system: Secondary | ICD-10-CM | POA: Diagnosis not present

## 2023-10-28 DIAGNOSIS — R531 Weakness: Secondary | ICD-10-CM | POA: Diagnosis present

## 2023-10-28 DIAGNOSIS — Z87891 Personal history of nicotine dependence: Secondary | ICD-10-CM

## 2023-10-28 DIAGNOSIS — R55 Syncope and collapse: Principal | ICD-10-CM | POA: Diagnosis present

## 2023-10-28 DIAGNOSIS — I4819 Other persistent atrial fibrillation: Secondary | ICD-10-CM | POA: Diagnosis present

## 2023-10-28 DIAGNOSIS — Z7901 Long term (current) use of anticoagulants: Secondary | ICD-10-CM

## 2023-10-28 DIAGNOSIS — R002 Palpitations: Secondary | ICD-10-CM | POA: Diagnosis not present

## 2023-10-28 DIAGNOSIS — I480 Paroxysmal atrial fibrillation: Secondary | ICD-10-CM | POA: Diagnosis not present

## 2023-10-28 DIAGNOSIS — Z8249 Family history of ischemic heart disease and other diseases of the circulatory system: Secondary | ICD-10-CM

## 2023-10-28 DIAGNOSIS — D6869 Other thrombophilia: Secondary | ICD-10-CM | POA: Diagnosis present

## 2023-10-28 DIAGNOSIS — Z950 Presence of cardiac pacemaker: Secondary | ICD-10-CM | POA: Diagnosis not present

## 2023-10-28 DIAGNOSIS — Z981 Arthrodesis status: Secondary | ICD-10-CM

## 2023-10-28 DIAGNOSIS — I6523 Occlusion and stenosis of bilateral carotid arteries: Secondary | ICD-10-CM | POA: Diagnosis not present

## 2023-10-28 DIAGNOSIS — F32A Depression, unspecified: Secondary | ICD-10-CM | POA: Diagnosis present

## 2023-10-28 DIAGNOSIS — Z79899 Other long term (current) drug therapy: Secondary | ICD-10-CM

## 2023-10-28 DIAGNOSIS — Z96653 Presence of artificial knee joint, bilateral: Secondary | ICD-10-CM | POA: Diagnosis present

## 2023-10-28 DIAGNOSIS — I484 Atypical atrial flutter: Secondary | ICD-10-CM | POA: Diagnosis present

## 2023-10-28 DIAGNOSIS — R9431 Abnormal electrocardiogram [ECG] [EKG]: Secondary | ICD-10-CM | POA: Diagnosis not present

## 2023-10-28 DIAGNOSIS — K219 Gastro-esophageal reflux disease without esophagitis: Secondary | ICD-10-CM | POA: Diagnosis present

## 2023-10-28 LAB — I-STAT CHEM 8, ED
BUN: 19 mg/dL (ref 8–23)
Calcium, Ion: 1.05 mmol/L — ABNORMAL LOW (ref 1.15–1.40)
Chloride: 106 mmol/L (ref 98–111)
Creatinine, Ser: 0.9 mg/dL (ref 0.44–1.00)
Glucose, Bld: 91 mg/dL (ref 70–99)
HCT: 35 % — ABNORMAL LOW (ref 36.0–46.0)
Hemoglobin: 11.9 g/dL — ABNORMAL LOW (ref 12.0–15.0)
Potassium: 4.3 mmol/L (ref 3.5–5.1)
Sodium: 136 mmol/L (ref 135–145)
TCO2: 25 mmol/L (ref 22–32)

## 2023-10-28 LAB — COMPREHENSIVE METABOLIC PANEL WITH GFR
ALT: 65 U/L — ABNORMAL HIGH (ref 0–44)
AST: 55 U/L — ABNORMAL HIGH (ref 15–41)
Albumin: 4 g/dL (ref 3.5–5.0)
Alkaline Phosphatase: 91 U/L (ref 38–126)
Anion gap: 13 (ref 5–15)
BUN: 15 mg/dL (ref 8–23)
CO2: 21 mmol/L — ABNORMAL LOW (ref 22–32)
Calcium: 9.3 mg/dL (ref 8.9–10.3)
Chloride: 103 mmol/L (ref 98–111)
Creatinine, Ser: 0.88 mg/dL (ref 0.44–1.00)
GFR, Estimated: >60 mL/min (ref >60)
Glucose, Bld: 92 mg/dL (ref 70–99)
Potassium: 4.3 mmol/L (ref 3.5–5.1)
Sodium: 137 mmol/L (ref 135–145)
Total Bilirubin: 0.7 mg/dL (ref 0.0–1.2)
Total Protein: 6.6 g/dL (ref 6.5–8.1)

## 2023-10-28 LAB — CBC
HCT: 37.2 % (ref 36.0–46.0)
Hemoglobin: 12.2 g/dL (ref 12.0–15.0)
MCH: 33.2 pg (ref 26.0–34.0)
MCHC: 32.8 g/dL (ref 30.0–36.0)
MCV: 101.1 fL — ABNORMAL HIGH (ref 80.0–100.0)
Platelets: 221 K/uL (ref 150–400)
RBC: 3.68 MIL/uL — ABNORMAL LOW (ref 3.87–5.11)
RDW: 12.2 % (ref 11.5–15.5)
WBC: 7.7 K/uL (ref 4.0–10.5)
nRBC: 0 % (ref 0.0–0.2)

## 2023-10-28 LAB — DIFFERENTIAL
Abs Immature Granulocytes: 0.03 K/uL (ref 0.00–0.07)
Basophils Absolute: 0.1 K/uL (ref 0.0–0.1)
Basophils Relative: 1 %
Eosinophils Absolute: 0.1 K/uL (ref 0.0–0.5)
Eosinophils Relative: 1 %
Immature Granulocytes: 0 %
Lymphocytes Relative: 15 %
Lymphs Abs: 1.2 K/uL (ref 0.7–4.0)
Monocytes Absolute: 0.8 K/uL (ref 0.1–1.0)
Monocytes Relative: 10 %
Neutro Abs: 5.5 K/uL (ref 1.7–7.7)
Neutrophils Relative %: 73 %

## 2023-10-28 LAB — PROTIME-INR
INR: 1.2 (ref 0.8–1.2)
Prothrombin Time: 15.4 s — ABNORMAL HIGH (ref 11.4–15.2)

## 2023-10-28 LAB — ETHANOL: Alcohol, Ethyl (B): <15 mg/dL (ref <15)

## 2023-10-28 LAB — APTT: aPTT: 33 s (ref 24–36)

## 2023-10-28 MED ORDER — SODIUM CHLORIDE 0.9% FLUSH
3.0000 mL | Freq: Once | INTRAVENOUS | Status: AC
  Administered 2023-10-29: 3 mL via INTRAVENOUS

## 2023-10-28 NOTE — ED Notes (Signed)
 Rn called over to assess pt by NT  due to low bp. RN assessed pt and found bp to be 144/63. RN then noted HR to be dropping on pulse oximeter. Rn took radial pulse and found it to be irregular. RN noted pulse would become thready when Pulse oximeter would note a slow HR. Pt is alert during these times but reports dizziness.

## 2023-10-28 NOTE — ED Provider Notes (Incomplete)
 [place] EMERGENCY DEPARTMENT AT [facility] Provider Note   CSN: [id number] Arrival date & time: 10/28/23  1745     History Chief Complaint  Patient presents with  . Transient Ischemic Attack    HPI Dominique Griffith is a 83 y.o. female presenting for chief complaint of double vision.   Patient's recorded medical, surgical, social, medication list and allergies were reviewed in the Snapshot window as part of the initial history.   Review of Systems   Review of Systems  Physical Exam Updated Vital Signs BP (!) 144/63   Pulse (!) 54   Temp 98.1 F (36.7 C)   Resp 16   Ht 5' 3 (1.6 m)   Wt 77.1 kg   SpO2 97%   BMI 30.11 kg/m  Physical Exam   ED Course/ Medical Decision Making/ A&P    Procedures Procedures   Medications Ordered in ED Medications  sodium chloride  flush (NS) 0.9 % injection 3 mL (has no administration in time range)    Medical Decision Making:   Dominique Griffith is a 83 y.o. female who presented to the ED today with *** detailed above.    {crccomplexity:27900} Complete initial physical exam performed, notably the patient  was ***.    Reviewed and confirmed nursing documentation for past medical history, family history, social history.    Initial Assessment:   With the patient's presentation of ***, most likely diagnosis is ***. Other diagnoses were considered including (but not limited to) ***. These are considered less likely due to history of present illness and physical exam findings.   {crccopa:27899}  Initial Plan:  ***  ***Screening labs including CBC and Metabolic panel to evaluate for infectious or metabolic etiology of disease.  ***Urinalysis with reflex culture ordered to evaluate for UTI or relevant urologic/nephrologic pathology.  ***CXR to evaluate for structural/infectious intrathoracic pathology.  {crccardiactesting:32591::EKG to evaluate for cardiac pathology} Objective evaluation as below reviewed   Initial  Study Results:   Laboratory  All laboratory results reviewed without evidence of clinically relevant pathology.   ***Exceptions include: ***   ***EKG EKG was reviewed independently. Rate, rhythm, axis, intervals all examined and without medically relevant abnormality. ST segments without concerns for elevations.    Radiology:  All images reviewed independently. ***Agree with radiology report at this time.   MR BRAIN WO CONTRAST Result Date: 10/28/2023 EXAM: MRI BRAIN WITHOUT CONTRAST 10/28/2023 07:26:56 PM TECHNIQUE: Multiplanar multisequence MRI of the head/brain was performed without the administration of intravenous contrast. COMPARISON: 11/21/2016 CLINICAL HISTORY: Transient ischemic attack (TIA). FINDINGS: BRAIN AND VENTRICLES: No acute infarct. No intracranial hemorrhage. No mass. No midline shift. No hydrocephalus. The sella is unremarkable. Normal flow voids. Multifocal hyperintense T2-weighted signal within the cerebral white matter, most commonly due to chronic small vessel disease. ORBITS: No acute abnormality. SINUSES AND MASTOIDS: No acute abnormality. BONES AND SOFT TISSUES: Normal marrow signal. No acute soft tissue abnormality. IMPRESSION: 1. No acute intracranial abnormality. 2. Multifocal hyperintense T2-weighted signal within the cerebral white matter, most commonly due to chronic small vessel disease. Electronically signed by: Franky Stanford MD 10/28/2023 07:54 PM EDT RP Workstation: [id number]   CT HEAD WO CONTRAST Result Date: 10/28/2023 CLINICAL DATA:  Neuro deficit, acute, stroke suspected. TIA EXAM: CT HEAD WITHOUT CONTRAST TECHNIQUE: Contiguous axial images were obtained from the base of the skull through the vertex without intravenous contrast. RADIATION DOSE REDUCTION: This exam was performed according to the departmental dose-optimization program which includes automated exposure control, adjustment of the  mA and/or kV according to patient size and/or use of iterative  reconstruction technique. COMPARISON:  CT head 10/15/2016 FINDINGS: Brain: No evidence of large-territorial acute infarction. No parenchymal hemorrhage. No mass lesion. No extra-axial collection. No mass effect or midline shift. No hydrocephalus. Basilar cisterns are patent. Vascular: No hyperdense vessel. Atherosclerotic calcifications are present within the cavernous internal carotid arteries. Skull: No acute fracture or focal lesion. Sinuses/Orbits: Paranasal sinuses and mastoid air cells are clear. Bilateral lens replacement. Otherwise the orbits are unremarkable. Other: None. IMPRESSION: No acute intracranial abnormality. Electronically Signed   By: Morgane  Naveau M.D.   On: 10/28/2023 19:19   CUP PACEART REMOTE DEVICE CHECK Result Date: 10/04/2023 ILR summary report received. Battery status OK. Normal device function. No new tachy, brady, episodes. 6 new AF episodes, longest duration 4hrs , overall controlled rates, burden 1.2%, Eliquis  per PA report. Monthly summary reports and ROV/PRN.  Presenting rhythm SB 5 symptom activation's, 2 correlating with AF events, one correlating with 09/28/23 5sec conversion pause previously reviewed and routed.  Pt has been scheduled 8/19 for possible PPM implant Additional symptoms c/w burst of AF or tachy prior to activaiton LA, CVRS.     Consults: Case discussed with ***.   Reassessment and Plan:   ***    ***  Clinical Impression: No diagnosis found.   Data Unavailable   Final Clinical Impression(s) / ED Diagnoses Final diagnoses:  None    Rx / DC Orders ED Discharge Orders     None

## 2023-10-28 NOTE — ED Triage Notes (Signed)
 Patient states she got very dizzy, had a left eye droopiness, trouble focusing her eyes for about 10 seconds this was about a hour ago. PCP sent her to the ED to be evaluated. Denies unilateral weakness and numbness. Patient states she has been short of breath for a while.

## 2023-10-28 NOTE — ED Notes (Signed)
 Pt complained of dizziness again. RN assessed pt . Pt found to have a hr in the 30s palpated. Pt taken to triage and EKG obtained.

## 2023-10-28 NOTE — ED Provider Notes (Signed)
 [place] EMERGENCY DEPARTMENT AT [facility] Provider Note   CSN: [id number] Arrival date & time: 10/28/23  1745     History Chief Complaint  Patient presents with   Transient Ischemic Attack    HPI Dominique Griffith is a 83 y.o. female presenting for chief complaint of near syncope episode.  States that she felt her heart start racing and got lightheaded at approximately 4 PM today.  Symptoms lasted for 5 or 6 minutes, she was very poorly responsive per her family member and then gradually return to baseline. History of A-fib on Eliquis .  Compliant with medication.  Also on propafenone , recently increased due to increased A-fib episodes detected on her loop recorder.  Has been having shortness of breath fatigue weakness all week this week..   Patient's recorded medical, surgical, social, medication list and allergies were reviewed in the Snapshot window as part of the initial history.   Review of Systems   Review of Systems  Constitutional:  Negative for chills and fever.  HENT:  Negative for ear pain and sore throat.   Eyes:  Negative for pain and visual disturbance.  Respiratory:  Negative for cough and shortness of breath.   Cardiovascular:  Negative for chest pain and palpitations.  Gastrointestinal:  Negative for abdominal pain and vomiting.  Genitourinary:  Negative for dysuria and hematuria.  Musculoskeletal:  Negative for arthralgias and back pain.  Skin:  Negative for color change and rash.  Neurological:  Positive for weakness. Negative for seizures and syncope.  All other systems reviewed and are negative.   Physical Exam Updated Vital Signs BP 97/70   Pulse 62   Temp 98 F (36.7 C) (Oral)   Resp 19   Ht 5' 3 (1.6 m)   Wt 77.1 kg   SpO2 95%   BMI 30.11 kg/m  Physical Exam Constitutional:      General: She is not in acute distress.    Appearance: She is not ill-appearing or toxic-appearing.  HENT:     Head: Normocephalic and atraumatic.   Eyes:     Extraocular Movements: Extraocular movements intact.     Pupils: Pupils are equal, round, and reactive to light.  Cardiovascular:     Rate and Rhythm: Normal rate.  Pulmonary:     Effort: No respiratory distress.  Abdominal:     General: Abdomen is flat.  Musculoskeletal:        General: No swelling, deformity or signs of injury.     Cervical back: Normal range of motion. No rigidity.  Skin:    General: Skin is warm and dry.  Neurological:     General: No focal deficit present.     Mental Status: She is alert and oriented to person, place, and time.  Psychiatric:        Mood and Affect: Mood normal.      ED Course/ Medical Decision Making/ A&P    Procedures .Critical Care  Performed by: Jerral Meth, MD Authorized by: Jerral Meth, MD   Critical care provider statement:    Critical care time (minutes):  80   Critical care was necessary to treat or prevent imminent or life-threatening deterioration of the following conditions:  Circulatory failure and cardiac failure   Critical care was time spent personally by me on the following activities:  Development of treatment plan with patient or surrogate, discussions with consultants, evaluation of patient's response to treatment, examination of patient, ordering and review of laboratory studies, ordering and review  of radiographic studies, ordering and performing treatments and interventions, pulse oximetry, re-evaluation of patient's condition and review of old charts   Care discussed with: admitting provider      Medications Ordered in ED Medications  acetaminophen  (TYLENOL ) tablet 650 mg (has no administration in time range)  ondansetron  (ZOFRAN ) injection 4 mg (has no administration in time range)  propafenone  (RYTHMOL ) tablet 300 mg (has no administration in time range)  sodium chloride  flush (NS) 0.9 % injection 3 mL (3 mLs Intravenous Given 10/29/23 0041)   Medical Decision Making:   Dominique Griffith  is a 83 y.o. female who presented to the ED today with a syncopal episode detailed above.    Additional history discussed with patient's family/caregivers.  Patient placed on continuous vitals and telemetry monitoring while in ED which was reviewed periodically.  Complete initial physical exam performed, notably the patient  was notably tachycardic.    Reviewed and confirmed nursing documentation for past medical history, family history, social history.    Initial Assessment:   With the patient's presentation of syncope, most likely diagnosis is orthostatic hypotension vs vasovagal episode. Other diagnoses were considered including (but not limited to) arrythmogenic syncope, valvular abnormality, PE, aortic dissection. These are considered less likely due to history of present illness and physical exam findings.   This is most consistent with an acute life/limb threatening illness complicated by underlying chronic conditions.  Initial Plan:  Patient evaluated as a possible stroke up in triage this included CT head, MRI brain.  Both of these were reviewed and negative for acute neurologic injury.  Presentation more consistent however with cardiac etiology Screening labs including CBC and Metabolic panel to evaluate for infectious or metabolic etiology of disease.  Urinalysis with reflex culture ordered to evaluate for UTI or relevant urologic/nephrologic pathology.  CXR to evaluate for structural/infectious intrathoracic pathology.  EKG/Troponin testing/BNP testing to evaluate for cardiac pathology. Objective evaluation as below reviewed after administration of IVF/Telemetry monitoring  Initial Study Results:   Laboratory  All laboratory results reviewed without evidence of clinically relevant pathology.    EKG EKG was reviewed independently. Rate, rhythm, axis, intervals all examined and without medically relevant abnormality. ST segments without concerns for elevations.    Radiology:  All  images reviewed independently. Agree with radiology report at this time.   MR BRAIN WO CONTRAST Result Date: 10/28/2023 EXAM: MRI BRAIN WITHOUT CONTRAST 10/28/2023 07:26:56 PM TECHNIQUE: Multiplanar multisequence MRI of the head/brain was performed without the administration of intravenous contrast. COMPARISON: 11/21/2016 CLINICAL HISTORY: Transient ischemic attack (TIA). FINDINGS: BRAIN AND VENTRICLES: No acute infarct. No intracranial hemorrhage. No mass. No midline shift. No hydrocephalus. The sella is unremarkable. Normal flow voids. Multifocal hyperintense T2-weighted signal within the cerebral white matter, most commonly due to chronic small vessel disease. ORBITS: No acute abnormality. SINUSES AND MASTOIDS: No acute abnormality. BONES AND SOFT TISSUES: Normal marrow signal. No acute soft tissue abnormality. IMPRESSION: 1. No acute intracranial abnormality. 2. Multifocal hyperintense T2-weighted signal within the cerebral white matter, most commonly due to chronic small vessel disease. Electronically signed by: Franky Stanford MD 10/28/2023 07:54 PM EDT RP Workstation: [id number]   CT HEAD WO CONTRAST Result Date: 10/28/2023 CLINICAL DATA:  Neuro deficit, acute, stroke suspected. TIA EXAM: CT HEAD WITHOUT CONTRAST TECHNIQUE: Contiguous axial images were obtained from the base of the skull through the vertex without intravenous contrast. RADIATION DOSE REDUCTION: This exam was performed according to the departmental dose-optimization program which includes automated exposure control, adjustment of the mA  and/or kV according to patient size and/or use of iterative reconstruction technique. COMPARISON:  CT head 10/15/2016 FINDINGS: Brain: No evidence of large-territorial acute infarction. No parenchymal hemorrhage. No mass lesion. No extra-axial collection. No mass effect or midline shift. No hydrocephalus. Basilar cisterns are patent. Vascular: No hyperdense vessel. Atherosclerotic calcifications are present  within the cavernous internal carotid arteries. Skull: No acute fracture or focal lesion. Sinuses/Orbits: Paranasal sinuses and mastoid air cells are clear. Bilateral lens replacement. Otherwise the orbits are unremarkable. Other: None. IMPRESSION: No acute intracranial abnormality. Electronically Signed   By: Morgane  Naveau M.D.   On: 10/28/2023 19:19   CUP PACEART REMOTE DEVICE CHECK Result Date: 10/04/2023 ILR summary report received. Battery status OK. Normal device function. No new tachy, brady, episodes. 6 new AF episodes, longest duration 4hrs , overall controlled rates, burden 1.2%, Eliquis  per PA report. Monthly summary reports and ROV/PRN.  Presenting rhythm SB 5 symptom activation's, 2 correlating with AF events, one correlating with 09/28/23 5sec conversion pause previously reviewed and routed.  Pt has been scheduled 8/19 for possible PPM implant Additional symptoms c/w burst of AF or tachy prior to activaiton LA, CVRS.     Consults: Case discussed with cardiology.   Final Assessment and Plan:   Patient remained in A-fib RVR the duration of their visit.  They were given their home dose of propafenone  with only minimal response.  After 4 hours they converted back to normal sinus rhythm.  This was accompanied with another syncopal event and a extended episode of asystole.  She responded and recovered robustly after approximately 5 seconds.  Cardiology was consulted and they recommended admission to their service for ongoing telemetry, possible placement of pacemaker given multiple episodes of syncope over the past 24 hours likely secondary to her sinus pause events.  Clinical Impression:  1. Syncope, unspecified syncope type   2. Atrial fibrillation with RVR (HCC)      Admit   Final Clinical Impression(s) / ED Diagnoses Final diagnoses:  Syncope, unspecified syncope type  Atrial fibrillation with RVR [facility])    Rx / DC Orders ED Discharge Orders     None          Jerral Meth, MD 10/29/23 [phone]

## 2023-10-28 NOTE — ED Provider Triage Note (Signed)
 Emergency Medicine Provider Triage Evaluation Note  Dominique Griffith , a 83 y.o. female  was evaluated in triage.  Pt complains of feeling severely dizzy, droopiness left eye, trouble focusing her eyes.  This lasted about a minute and that occurred about 1 hour ago.  Patient was seen and referred here by her PCP.[name]  Review of Systems  Positive: As above Negative: As above  Physical Exam  BP (!) 145/67 (BP Location: Right Arm)   Pulse 66   Temp 98 F (36.7 C)   Resp 18   Ht 5' 3 (1.6 m)   Wt 77.1 kg   SpO2 96%   BMI 30.11 kg/m  Gen:   Awake, no distress   Resp:  Normal effort  MSK:   Moves extremities without difficulty  Other:  Finger-to-nose normal.  No pronator drift.  Good strength in bilateral upper and lower extremities in extensor and flexor muscle groups.  Normal speech.  Cranial nerves III through XII intact.  Medical Decision Making  Medically screening exam initiated at 6:21 PM.  Appropriate orders placed.  Inocente SHAUNNA Hasten was informed that the remainder of the evaluation will be completed by another provider, this initial triage assessment does not replace that evaluation, and the importance of remaining in the ED until their evaluation is complete.    Hildegard Loge, PA-C 10/28/23 TRENNA

## 2023-10-28 NOTE — ED Triage Notes (Addendum)
 Pt here from home with c/o dizziness and family stated that her left eye was drooping , all symptoms only lasted for a few mins

## 2023-10-29 ENCOUNTER — Inpatient Hospital Stay (HOSPITAL_COMMUNITY)

## 2023-10-29 ENCOUNTER — Telehealth: Payer: Self-pay

## 2023-10-29 ENCOUNTER — Inpatient Hospital Stay (HOSPITAL_COMMUNITY)
Admission: EM | Disposition: A | Discharge status: Home / Self Care | Payer: Self-pay | Source: Home / Self Care | Attending: Cardiovascular Disease

## 2023-10-29 ENCOUNTER — Ambulatory Visit (HOSPITAL_COMMUNITY): Admit: 2023-10-29 | Admitting: Cardiology

## 2023-10-29 DIAGNOSIS — I10 Essential (primary) hypertension: Secondary | ICD-10-CM | POA: Diagnosis not present

## 2023-10-29 DIAGNOSIS — Z981 Arthrodesis status: Secondary | ICD-10-CM | POA: Diagnosis not present

## 2023-10-29 DIAGNOSIS — I495 Sick sinus syndrome: Secondary | ICD-10-CM | POA: Diagnosis not present

## 2023-10-29 DIAGNOSIS — F32A Depression, unspecified: Secondary | ICD-10-CM | POA: Diagnosis not present

## 2023-10-29 DIAGNOSIS — Z96653 Presence of artificial knee joint, bilateral: Secondary | ICD-10-CM | POA: Diagnosis not present

## 2023-10-29 DIAGNOSIS — R9431 Abnormal electrocardiogram [ECG] [EKG]: Secondary | ICD-10-CM

## 2023-10-29 DIAGNOSIS — F419 Anxiety disorder, unspecified: Secondary | ICD-10-CM | POA: Diagnosis not present

## 2023-10-29 DIAGNOSIS — Z823 Family history of stroke: Secondary | ICD-10-CM | POA: Diagnosis not present

## 2023-10-29 DIAGNOSIS — I4891 Unspecified atrial fibrillation: Secondary | ICD-10-CM | POA: Diagnosis present

## 2023-10-29 DIAGNOSIS — Z8249 Family history of ischemic heart disease and other diseases of the circulatory system: Secondary | ICD-10-CM | POA: Diagnosis not present

## 2023-10-29 DIAGNOSIS — Z79899 Other long term (current) drug therapy: Secondary | ICD-10-CM | POA: Diagnosis not present

## 2023-10-29 DIAGNOSIS — Z880 Allergy status to penicillin: Secondary | ICD-10-CM | POA: Diagnosis not present

## 2023-10-29 DIAGNOSIS — I480 Paroxysmal atrial fibrillation: Secondary | ICD-10-CM | POA: Diagnosis not present

## 2023-10-29 DIAGNOSIS — Z7901 Long term (current) use of anticoagulants: Secondary | ICD-10-CM | POA: Diagnosis not present

## 2023-10-29 DIAGNOSIS — K219 Gastro-esophageal reflux disease without esophagitis: Secondary | ICD-10-CM | POA: Diagnosis not present

## 2023-10-29 DIAGNOSIS — R002 Palpitations: Secondary | ICD-10-CM | POA: Diagnosis present

## 2023-10-29 DIAGNOSIS — I4819 Other persistent atrial fibrillation: Secondary | ICD-10-CM | POA: Diagnosis not present

## 2023-10-29 DIAGNOSIS — R55 Syncope and collapse: Secondary | ICD-10-CM | POA: Diagnosis not present

## 2023-10-29 DIAGNOSIS — I484 Atypical atrial flutter: Secondary | ICD-10-CM | POA: Diagnosis not present

## 2023-10-29 DIAGNOSIS — D6869 Other thrombophilia: Secondary | ICD-10-CM | POA: Diagnosis not present

## 2023-10-29 DIAGNOSIS — Z87891 Personal history of nicotine dependence: Secondary | ICD-10-CM | POA: Diagnosis not present

## 2023-10-29 DIAGNOSIS — R531 Weakness: Secondary | ICD-10-CM | POA: Diagnosis not present

## 2023-10-29 HISTORY — PX: PACEMAKER IMPLANT: EP1218

## 2023-10-29 LAB — CBC WITH DIFFERENTIAL/PLATELET
Abs Immature Granulocytes: 0.03 K/uL (ref 0.00–0.07)
Basophils Absolute: 0.1 K/uL (ref 0.0–0.1)
Basophils Relative: 1 %
Eosinophils Absolute: 0.1 K/uL (ref 0.0–0.5)
Eosinophils Relative: 2 %
HCT: 34.8 % — ABNORMAL LOW (ref 36.0–46.0)
Hemoglobin: 11.7 g/dL — ABNORMAL LOW (ref 12.0–15.0)
Immature Granulocytes: 0 %
Lymphocytes Relative: 16 %
Lymphs Abs: 1.2 K/uL (ref 0.7–4.0)
MCH: 33.3 pg (ref 26.0–34.0)
MCHC: 33.6 g/dL (ref 30.0–36.0)
MCV: 99.1 fL (ref 80.0–100.0)
Monocytes Absolute: 0.8 K/uL (ref 0.1–1.0)
Monocytes Relative: 11 %
Neutro Abs: 5.3 K/uL (ref 1.7–7.7)
Neutrophils Relative %: 70 %
Platelets: 213 K/uL (ref 150–400)
RBC: 3.51 MIL/uL — ABNORMAL LOW (ref 3.87–5.11)
RDW: 12.3 % (ref 11.5–15.5)
WBC: 7.4 K/uL (ref 4.0–10.5)
nRBC: 0 % (ref 0.0–0.2)

## 2023-10-29 LAB — ECHOCARDIOGRAM COMPLETE
Area-P 1/2: 3.15 cm2
Height: 63 in
S' Lateral: 2.1 cm
Weight: 2772.5 [oz_av]

## 2023-10-29 LAB — BASIC METABOLIC PANEL WITH GFR
Anion gap: 8 (ref 5–15)
BUN: 14 mg/dL (ref 8–23)
CO2: 24 mmol/L (ref 22–32)
Calcium: 9 mg/dL (ref 8.9–10.3)
Chloride: 105 mmol/L (ref 98–111)
Creatinine, Ser: 0.78 mg/dL (ref 0.44–1.00)
GFR, Estimated: >60 mL/min (ref >60)
Glucose, Bld: 105 mg/dL — ABNORMAL HIGH (ref 70–99)
Potassium: 3.9 mmol/L (ref 3.5–5.1)
Sodium: 137 mmol/L (ref 135–145)

## 2023-10-29 LAB — PROTIME-INR
INR: 1.1 (ref 0.8–1.2)
Prothrombin Time: 14.6 s (ref 11.4–15.2)

## 2023-10-29 LAB — MAGNESIUM: Magnesium: 2 mg/dL (ref 1.7–2.4)

## 2023-10-29 SURGERY — PACEMAKER IMPLANT

## 2023-10-29 MED ORDER — IBUPROFEN 400 MG PO TABS
800.0000 mg | ORAL_TABLET | Freq: Three times a day (TID) | ORAL | Status: DC | PRN
  Administered 2023-10-30: 800 mg via ORAL
  Filled 2023-10-29: qty 2

## 2023-10-29 MED ORDER — CEFAZOLIN SODIUM-DEXTROSE 2-4 GM/100ML-% IV SOLN
INTRAVENOUS | Status: AC
  Filled 2023-10-29: qty 100

## 2023-10-29 MED ORDER — ACETAMINOPHEN 325 MG PO TABS: 975.0000 mg | ORAL_TABLET | Freq: Three times a day (TID) | ORAL | Status: DC

## 2023-10-29 MED ORDER — MIDAZOLAM HCL 2 MG/2ML IJ SOLN
INTRAMUSCULAR | Status: AC
  Filled 2023-10-29: qty 2

## 2023-10-29 MED ORDER — LIDOCAINE HCL (PF) 1 % IJ SOLN
INTRAMUSCULAR | Status: DC | PRN
  Administered 2023-10-29: 10 mL
  Administered 2023-10-29: 30 mL
  Administered 2023-10-29: 10 mL

## 2023-10-29 MED ORDER — CHLORHEXIDINE GLUCONATE 4 % EX SOLN: 60.0000 mL | Freq: Once | CUTANEOUS | Status: DC

## 2023-10-29 MED ORDER — APIXABAN 5 MG PO TABS: 5.0000 mg | ORAL_TABLET | Freq: Two times a day (BID) | ORAL | Status: DC

## 2023-10-29 MED ORDER — VANCOMYCIN HCL IN DEXTROSE 1-5 GM/200ML-% IV SOLN: 1000.0000 mg | Freq: Once | INTRAVENOUS | Status: AC

## 2023-10-29 MED ORDER — SODIUM CHLORIDE 0.9 % IV SOLN: INTRAVENOUS | Status: DC

## 2023-10-29 MED ORDER — LIDOCAINE HCL (PF) 1 % IJ SOLN
INTRAMUSCULAR | Status: AC
Start: 2023-10-29 — End: 2023-10-29
  Filled 2023-10-29: qty 30

## 2023-10-29 MED ORDER — PROPAFENONE HCL 150 MG PO TABS
300.0000 mg | ORAL_TABLET | Freq: Three times a day (TID) | ORAL | Status: DC
  Administered 2023-10-29: 300 mg via ORAL
  Filled 2023-10-29 (×2): qty 2

## 2023-10-29 MED ORDER — MELATONIN 3 MG PO TABS
3.0000 mg | ORAL_TABLET | Freq: Every day | ORAL | Status: DC
  Administered 2023-10-29: 3 mg via ORAL
  Filled 2023-10-29: qty 1

## 2023-10-29 MED ORDER — SERTRALINE HCL 100 MG PO TABS
100.0000 mg | ORAL_TABLET | Freq: Every day | ORAL | Status: DC
  Administered 2023-10-29 – 2023-10-30 (×2): 100 mg via ORAL
  Filled 2023-10-29 (×2): qty 1

## 2023-10-29 MED ORDER — FENTANYL CITRATE (PF) 100 MCG/2ML IJ SOLN
INTRAMUSCULAR | Status: AC
  Filled 2023-10-29: qty 2

## 2023-10-29 MED ORDER — LIDOCAINE HCL (PF) 1 % IJ SOLN
INTRAMUSCULAR | Status: AC
  Filled 2023-10-29: qty 60

## 2023-10-29 MED ORDER — PROPAFENONE HCL 150 MG PO TABS
300.0000 mg | ORAL_TABLET | Freq: Two times a day (BID) | ORAL | Status: DC
  Administered 2023-10-29 – 2023-10-30 (×3): 300 mg via ORAL
  Filled 2023-10-29 (×3): qty 2

## 2023-10-29 MED ORDER — ONDANSETRON HCL 4 MG/2ML IJ SOLN: 4.0000 mg | Freq: Four times a day (QID) | INTRAMUSCULAR | Status: DC | PRN

## 2023-10-29 MED ORDER — CEFAZOLIN SODIUM-DEXTROSE 2-4 GM/100ML-% IV SOLN
2.0000 g | INTRAVENOUS | Status: DC
Start: 2023-10-29 — End: 2023-10-29

## 2023-10-29 MED ORDER — LORATADINE 10 MG PO TABS
10.0000 mg | ORAL_TABLET | Freq: Every day | ORAL | Status: DC
  Administered 2023-10-29 – 2023-10-30 (×2): 10 mg via ORAL
  Filled 2023-10-29 (×2): qty 1

## 2023-10-29 MED ORDER — MIDAZOLAM HCL 2 MG/2ML IJ SOLN
INTRAMUSCULAR | Status: AC
Start: 2023-10-29 — End: 2023-10-29
  Filled 2023-10-29: qty 2

## 2023-10-29 MED ORDER — VANCOMYCIN HCL IN DEXTROSE 1-5 GM/200ML-% IV SOLN
INTRAVENOUS | Status: AC
  Administered 2023-10-29: 1000 mg via INTRAVENOUS
  Filled 2023-10-29: qty 200

## 2023-10-29 MED ORDER — SODIUM CHLORIDE 0.9 % IV SOLN
80.0000 mg | INTRAVENOUS | Status: AC
  Filled 2023-10-29: qty 2

## 2023-10-29 MED ORDER — FAMOTIDINE 20 MG PO TABS
40.0000 mg | ORAL_TABLET | Freq: Every evening | ORAL | Status: DC
  Administered 2023-10-29: 40 mg via ORAL
  Filled 2023-10-29: qty 2

## 2023-10-29 MED ORDER — BACLOFEN 10 MG PO TABS
10.0000 mg | ORAL_TABLET | Freq: Every day | ORAL | Status: DC
  Administered 2023-10-29: 10 mg via ORAL
  Filled 2023-10-29: qty 1

## 2023-10-29 MED ORDER — VANCOMYCIN HCL IN DEXTROSE 1-5 GM/200ML-% IV SOLN
1000.0000 mg | Freq: Once | INTRAVENOUS | Status: AC
  Administered 2023-10-30: 1000 mg via INTRAVENOUS
  Filled 2023-10-29: qty 200

## 2023-10-29 MED ORDER — FENTANYL CITRATE (PF) 100 MCG/2ML IJ SOLN
INTRAMUSCULAR | Status: DC | PRN
  Administered 2023-10-29 (×6): 12.5 ug via INTRAVENOUS

## 2023-10-29 MED ORDER — MIDAZOLAM HCL 5 MG/5ML IJ SOLN
INTRAMUSCULAR | Status: DC | PRN
  Administered 2023-10-29: 1 mg via INTRAVENOUS
  Administered 2023-10-29: .5 mg via INTRAVENOUS
  Administered 2023-10-29 (×5): 1 mg via INTRAVENOUS

## 2023-10-29 MED ORDER — ACETAMINOPHEN 325 MG PO TABS: 650.0000 mg | ORAL_TABLET | ORAL | Status: DC | PRN

## 2023-10-29 MED ORDER — OXYCODONE HCL 5 MG PO TABS: 5.0000 mg | ORAL_TABLET | Freq: Four times a day (QID) | ORAL | Status: DC | PRN

## 2023-10-29 MED ORDER — MIRABEGRON ER 25 MG PO TB24
50.0000 mg | ORAL_TABLET | Freq: Every day | ORAL | Status: DC
  Administered 2023-10-29 – 2023-10-30 (×2): 50 mg via ORAL
  Filled 2023-10-29 (×2): qty 2

## 2023-10-29 MED ORDER — SODIUM CHLORIDE 0.9 % IV SOLN
INTRAVENOUS | Status: AC
  Administered 2023-10-29: 80 mg
  Filled 2023-10-29: qty 2

## 2023-10-29 SURGICAL SUPPLY — 11 items
CABLE SURGICAL S-101-97-12 (CABLE) ×1 IMPLANT
CATH RIGHTSITE C315HIS02 (CATHETERS) IMPLANT
IPG PACE AZUR XT DR MRI W1DR01 (Pacemaker) IMPLANT
LEAD CAPSURE NOVUS 5076-52CM (Lead) IMPLANT
LEAD SELECT SECURE 3830 383069 (Lead) IMPLANT
PAD DEFIB RADIO PHYSIO CONN (PAD) ×1 IMPLANT
SHEATH 7FR PRELUDE SNAP 13 (SHEATH) IMPLANT
SHEATH PROBE COVER 6X72 (BAG) IMPLANT
SLITTER 6232ADJ (MISCELLANEOUS) IMPLANT
TRAY PACEMAKER INSERTION (PACKS) ×1 IMPLANT
WIRE HI TORQ VERSACORE-J 145CM (WIRE) IMPLANT

## 2023-10-29 NOTE — Progress Notes (Signed)
  Echocardiogram 2D Echocardiogram has been performed.  Tinnie FORBES Gosling RDCS 10/29/2023, 9:55 AM

## 2023-10-29 NOTE — Plan of Care (Signed)

## 2023-10-29 NOTE — Discharge Instructions (Signed)
 After Your Pacemaker   You have a Environmental education officer  If you have a Medtronic or Biotronik device, plug in your home monitor once you get home, and no manual interaction is required.   If you have an [employer] or [employer] device, plug your home monitor once you get home, sit near the device, and press the large activation button. Sit nearby until the process is complete, usually notated by lights on the monitor.   If you were set up for monitoring using an app on your phone, make sure the app remains open in the background and the Bluetooth remains on.  ACTIVITY Do not lift your arm above shoulder height for 1 week after your procedure. After 7 days, you may progress as below.  You should remove your sling 24 hours after your procedure, unless otherwise instructed by your provider.     Friday November 05, 2023  Saturday November 06, 2023 Sunday November 07, 2023 Monday November 08, 2023   Do not lift, push, pull, or carry anything over 10 pounds with the affected arm until 6 weeks (Friday December 10, 2023 ) after your procedure.   You may drive AFTER your wound check, unless you have been told otherwise by your provider.   Ask your healthcare provider when you can go back to work   INCISION/Dressing HOLD Eliquis  post procedure. Resume on am of 11/03/23  If large square, outer bandage is left in place, this can be removed after 24 hours from your procedure. Do not remove steri-strips or glue as below.   If a PRESSURE DRESSING (a bulky dressing that usually goes up over your shoulder) was applied or left in place, please follow instructions given by your provider on when to return to have this removed.   Monitor your Pacemaker site for redness, swelling, and drainage. Call the device clinic at [phone] if you experience these symptoms or fever/chills.  If your incision is sealed with Steri-strips or staples, you may shower 7 days after your procedure or when told  by your provider. Do not remove the steri-strips or let the shower hit directly on your site. You may wash around your site with soap and water .    If you were discharged in a sling, please do not wear this during the day more than 48 hours after your surgery unless otherwise instructed. This may increase the risk of stiffness and soreness in your shoulder.   Avoid lotions, ointments, or perfumes over your incision until it is well-healed.  You may use a hot tub or a pool AFTER your wound check appointment if the incision is completely closed.  Pacemaker Alerts:  Some alerts are vibratory and others beep. These are NOT emergencies. Please call our office to let us  know. If this occurs at night or on weekends, it can wait until the next business day. Send a remote transmission.  If your device is capable of reading fluid status (for heart failure), you will be offered monthly monitoring to review this with you.   DEVICE MANAGEMENT Remote monitoring is used to monitor your pacemaker from home. This monitoring is scheduled every 91 days by our office. It allows us  to keep an eye on the functioning of your device to ensure it is working properly. You will routinely see your Electrophysiologist annually (more often if necessary).  This will appear as a REMOTE check on your MyChart schedule. These are automatic and there is nothing for you to manually do unless otherwise  instructed.  You should receive your ID card for your new device in 4-8 weeks. Keep this card with you at all times once received. Consider wearing a medical alert bracelet or necklace.  Your Pacemaker may be MRI compatible. This will be discussed at your next office visit/wound check.  You should avoid contact with strong electric or magnetic fields.   Do not use amateur (ham) radio equipment or electric (arc) welding torches. MP3 player headphones with magnets should not be used. Some devices are safe to use if held at least 12 inches  (30 cm) from your Pacemaker. These include power tools, lawn mowers, and speakers. If you are unsure if something is safe to use, ask your health care provider.  When using your cell phone, hold it to the ear that is on the opposite side from the Pacemaker. Do not leave your cell phone in a pocket over the Pacemaker.  You may safely use electric blankets, heating pads, computers, and microwave ovens.  Call the office right away if: You have chest pain. You feel more short of breath than you have felt before. You feel more light-headed than you have felt before. Your incision starts to open up.  This information is not intended to replace advice given to you by your health care provider. Make sure you discuss any questions you have with your health care provider.

## 2023-10-29 NOTE — ED Notes (Signed)
 ED Provider at bedside.

## 2023-10-29 NOTE — H&P (Signed)
 Expand All Collapse All      ELECTROPHYSIOLOGY CONSULT NOTE      Patient ID: Dominique Griffith MRN: [id number], DOB/AGE: [birth year] 83 y.o.   Admit date: 10/28/2023 Date of Consult: 10/29/2023   Primary Physician: Rexanne Ingle, MD Primary Cardiologist: Jerel Balding, MD  Electrophysiologist: Dr. Inocencio    Referring Provider: Dr. Balding   Patient Profile: Dominique Griffith is a 83 y.o. female with a history of AF, HTN, GERD, arthritis who is being seen today for the evaluation of post conversion pauses at the request of Dr. Balding.   At baseline, she follows with Dr. Inocencio. She has an ILR in place for monitoring.  She underwent PVI, PW ablation with PF energy 11/2022 and CTI ablation. PPM had previously been discussed but she elected for attempted rhythm control with propafenone .    HPI:  Dominique Griffith is a 83 y.o. female who presented to [facility] ER on 10/28/23 with reports of palpitations, confusion and dizziness.    On presentation she reported she had an episode of dizziness & almost passed out.  She stated the episode lasted a few seconds and she returned to baseline.  She notes she had a similar episode while in hospital that was associated with post AFL conversion pause with several beats of junctional rhythm post. While in ER, she was noted to have AF with post conversion pauses lasting up to 5-6 seconds at at time.   The patient reports she has not felt well on propafenone  and would like to come off if possible.     She denies chest pain, dyspnea, PND, orthopnea, nausea, vomiting, syncope, edema, weight gain, or early satiety.     Labs Potassium3.9 (09/05 0515) Magnesium  2.0 (09/05 0515) Creatinine, ser  0.78 (09/05 0515) PLT  213 (09/05 0515) HGB  11.7* (09/05 0515) WBC 7.4 (09/05 0515)  .         Past Medical History:  Diagnosis Date   Anxiety     Complication of anesthesia      BACK SURGERY IN 2010; WAS PUT UNDER GENERAL ANESTHESIA ; REPORTS MEMORY LOSS AND  CHANGE IN BEHAVIOR FOR 2 MONTHS POST-OP ; STILL OCCASIONALLY HAS TIMES SHE FORGETS THINGS    Depression     GERD (gastroesophageal reflux disease)     Hypertension     Syncope and collapse      6 MONTHS AGO HAD SYNCOPE AND COLLAPSE ; SAW CARDIOLOGIST;  HAD UNREMARKABLE ECHO ;           Surgical History:       Past Surgical History:  Procedure Laterality Date   ATRIAL FIBRILLATION ABLATION N/A 12/24/2022    Procedure: ATRIAL FIBRILLATION ABLATION;  Surgeon: Inocencio Soyla Lunger, MD;  Location: [facility];  Service: Cardiovascular;  Laterality: N/A;   BACK SURGERY   2010    LUMBAR FUSION  WITH RODS IN PLACE   ESOPHAGOGASTRODUODENOSCOPY (EGD) WITH PROPOFOL  N/A 07/16/2020    Procedure: ESOPHAGOGASTRODUODENOSCOPY (EGD) WITH PROPOFOL ;  Surgeon: Burnette Fallow, MD;  Location: [facility];  Service: Endoscopy;  Laterality: N/A;   KNEE ARTHROPLASTY Right 04/22/2017    Procedure: RIGHT TOTAL KNEE ARTHROPLASTY WITH COMPUTER NAVIGATION;  Surgeon: Fidel Rogue, MD;  Location: [facility] ORS;  Service: Orthopedics;  Laterality: Right;  Needs RNFA   KNEE ARTHROPLASTY Left 06/10/2017    Procedure: LEFT TOTAL KNEE ARTHROPLASTY WITH COMPUTER NAVIGATION;  Surgeon: Fidel Rogue, MD;  Location: [facility] OR[facility];  Service: Orthopedics;  Laterality: Left;  Needs RNFA  Medications Prior to Admission  Medication Sig Dispense Refill Last Dose/Taking   apixaban  (ELIQUIS ) 5 MG TABS tablet Take 1 tablet (5 mg total) by mouth 2 (two) times daily. 180 tablet 3 10/28/2023 at  9:00 AM   baclofen  (LIORESAL ) 10 MG tablet Take 1 tablet (10 mg total) by mouth 3 (three) times daily. (Patient taking differently: Take 10 mg by mouth at bedtime.) 270 tablet 3 10/27/2023   cetirizine (ZYRTEC) 10 MG tablet Take 10 mg by mouth at bedtime.     10/27/2023   clindamycin  (CLEOCIN ) 300 MG capsule Take 300 mg by mouth See admin instructions. Take prior to dental procedures     Unknown   estradiol  (ESTRACE ) 0.1 MG/GM vaginal  cream Insert blueberry size amount of cream on finger (or 0.5 grams with applicator) in vagina daily x 2 week then 2 x per week. (Patient taking differently: Place 0.5 g vaginally See admin instructions. Use a blueberry size (0.5g) of cream in vagina twice per week.) 42.5 g 3 10/25/2023   famotidine  (PEPCID ) 40 MG tablet Take 1 tablet (40 mg total) by mouth every evening. 90 tablet 1 10/27/2023   losartan  (COZAAR ) 100 MG tablet Take 1 tablet (100 mg total) by mouth daily for blood pressure. 90 tablet 3 10/28/2023 Morning   mirabegron  ER (MYRBETRIQ ) 50 MG TB24 tablet Take 1 tablet (50 mg total) by mouth daily. 30 tablet 6 10/28/2023 Morning   Misc Natural Products (GLUCOSAMINE CHOND MSM FORMULA PO) Take 1 tablet by mouth daily.     10/28/2023 Morning   Multiple Vitamins-Minerals (WOMENS DAILY FORMULA) TABS Take 1 tablet by mouth daily.     10/28/2023 Morning   naproxen sodium (ALEVE) 220 MG tablet Take 440 mg by mouth daily as needed (pain).     Past Month   Polyethyl Glycol-Propyl Glycol (SYSTANE) 0.4-0.3 % SOLN Place 1 drop into both eyes daily as needed (dry eyes).     10/28/2023 Morning   propafenone  (RYTHMOL  SR) 325 MG 12 hr capsule Take 1 capsule (325 mg total) by mouth 2 (two) times daily. 60 capsule 3 10/28/2023 Morning   sertraline  (ZOLOFT ) 100 MG tablet Take 1 tablet (100 mg total) by mouth daily. 90 tablet 1 10/28/2023 Morning   TART CHERRY ULTRA PO Take 1 tablet by mouth every other day. Take one tablet by mouth every other day.     10/27/2023          Inpatient Medications:   baclofen   10 mg Oral QHS   famotidine   40 mg Oral QPM   loratadine   10 mg Oral Daily   mirabegron  ER  50 mg Oral Daily   propafenone   300 mg Oral BID   sertraline   100 mg Oral Daily          Allergies:  Allergies       Allergies  Allergen Reactions   Amoxicillin Rash      Has patient had a PCN reaction causing immediate rash, facial/tongue/throat swelling, SOB or lightheadedness with hypotension: No Has patient had a  PCN reaction causing severe rash involving mucus membranes or skin necrosis: No Has patient had a PCN reaction that required hospitalization: No Has patient had a PCN reaction occurring within the last 10 years: No If all of the above answers are NO, then may proceed with Cephalosporin use.     Penicillins Rash and Other (See Comments)      Has patient had a PCN reaction causing immediate rash, facial/tongue/throat swelling, SOB or lightheadedness  with hypotension: No Has patient had a PCN reaction causing severe rash involving mucus membranes or skin necrosis: No Has patient had a PCN reaction that required hospitalization: No Has patient had a PCN reaction occurring within the last 10 years: No If all of the above answers are NO, then may proceed with Cephalosporin use.             Family History  Problem Relation Age of Onset   Stroke Mother     Heart attack Father            Physical Exam:       Vitals:    10/29/23 0532 10/29/23 0536 10/29/23 0606 10/29/23 0749  BP:   97/70 115/62 137/72  Pulse: 65 62 65 64  Resp: 17 19 18 20   Temp:     98.1 F (36.7 C) 97.9 F (36.6 C)  TempSrc:     Oral Oral  SpO2: 94% 95% 93% 97%  Weight:     78.6 kg    Height:              GEN- pleasant elderly adult female lying in bed in NAD, A&O x 3, normal affect   HEENT: Normocephalic, atraumatic Lungs- CTAB, Normal effort.  Heart- Regular rate and rhythm, No M/G/R.  GI- Soft, NT, ND.  Extremities- No clubbing, cyanosis, or edema   Radiology/Studies:  Imaging Results  MR BRAIN WO CONTRAST Result Date: 10/28/2023 EXAM: MRI BRAIN WITHOUT CONTRAST 10/28/2023 07:26:56 PM TECHNIQUE: Multiplanar multisequence MRI of the head/brain was performed without the administration of intravenous contrast. COMPARISON: 11/21/2016 CLINICAL HISTORY: Transient ischemic attack (TIA). FINDINGS: BRAIN AND VENTRICLES: No acute infarct. No intracranial hemorrhage. No mass. No midline shift. No hydrocephalus.  The sella is unremarkable. Normal flow voids. Multifocal hyperintense T2-weighted signal within the cerebral white matter, most commonly due to chronic small vessel disease. ORBITS: No acute abnormality. SINUSES AND MASTOIDS: No acute abnormality. BONES AND SOFT TISSUES: Normal marrow signal. No acute soft tissue abnormality. IMPRESSION: 1. No acute intracranial abnormality. 2. Multifocal hyperintense T2-weighted signal within the cerebral white matter, most commonly due to chronic small vessel disease. Electronically signed by: Franky Stanford MD 10/28/2023 07:54 PM EDT RP Workstation: [id number]    CT HEAD WO CONTRAST Result Date: 10/28/2023 CLINICAL DATA:  Neuro deficit, acute, stroke suspected. TIA EXAM: CT HEAD WITHOUT CONTRAST TECHNIQUE: Contiguous axial images were obtained from the base of the skull through the vertex without intravenous contrast. RADIATION DOSE REDUCTION: This exam was performed according to the departmental dose-optimization program which includes automated exposure control, adjustment of the mA and/or kV according to patient size and/or use of iterative reconstruction technique. COMPARISON:  CT head 10/15/2016 FINDINGS: Brain: No evidence of large-territorial acute infarction. No parenchymal hemorrhage. No mass lesion. No extra-axial collection. No mass effect or midline shift. No hydrocephalus. Basilar cisterns are patent. Vascular: No hyperdense vessel. Atherosclerotic calcifications are present within the cavernous internal carotid arteries. Skull: No acute fracture or focal lesion. Sinuses/Orbits: Paranasal sinuses and mastoid air cells are clear. Bilateral lens replacement. Otherwise the orbits are unremarkable. Other: None. IMPRESSION: No acute intracranial abnormality. Electronically Signed   By: Morgane  Naveau M.D.   On: 10/28/2023 19:19    CUP PACEART REMOTE DEVICE CHECK Result Date: 10/04/2023 ILR summary report received. Battery status OK. Normal device function. No new  tachy, brady, episodes. 6 new AF episodes, longest duration 4hrs , overall controlled rates, burden 1.2%, Eliquis  per PA report. Monthly summary reports and ROV/PRN.  Presenting  rhythm SB 5 symptom activation's, 2 correlating with AF events, one correlating with 09/28/23 5sec conversion pause previously reviewed and routed.  Pt has been scheduled 8/19 for possible PPM implant Additional symptoms c/w burst of AF or tachy prior to activaiton LA, CVRS.      EKG:  AF with RVR, 129 bpm(personally reviewed)   TELEMETRY: SR 60-70's (personally reviewed)   DEVICE HISTORY:  MDT ILR implanted 07/01/23 for AF monitoring       Arrhythmia / AAD / Pertinent EP Studies AF  EPS 11/2022   Propafenone       Assessment/Plan:   Tachy-Brady Syndrome  Persistent Atrial Fibrillation  High Risk Medication Monitoring: Propafenone   -ILR notable for 57% burden AF for current monitoring period (8/29-9/5) with episodes of  post conversion pauses (see phone note for full episode) -update ECHO, bedside LVEF looked normal by my review during ECHO  -NPO for PPM  -ok for clear liquid diet now and then NPO -Eliquis  last dose on 9/4 am -risks / benefits discussed with patient per Dr. Inocencio  -anticipate stopping propafenone  post PPM insertion    Secondary Hypercoagulable State  -hold PTA Eliquis  for PPM implant          For questions or updates, please contact [facility] Please consult [email or web address] for contact info under      Signed, Daphne Barrack, NP-C, AGACNP-BC [facility] - Electrophysiology  10/29/2023, 9:25 AM           I have seen and examined this patient with Daphne Barrack.  Agree with above, note added to reflect my findings.  Patient with a history of atrial fibrillation.  She is post ablation.  She continues to have short runs of atrial fibrillation complicated by postconversion pauses.  She is near syncopal during these pauses.  She presented to the hospital due to  recurrent episodes of dizziness.   GEN: No acute distress.   Neck: No JVD Cardiac: RRR, no murmurs, rubs, or gallops.  Respiratory: normal BS bases bilaterally. GI: Soft, nontender, non-distended  MS: No edema; No deformity. Neuro:  Nonfocal  Skin: warm and dry Psych: Normal affect      Tachybradycardia syndrome Persistent atrial fibrillation Patient continues to have episodes of atrial fibrillation and postconversion pauses.  She would benefit from rhythm control as her atrial fibrillation is quite fast.  Due to this, we will plan for pacemaker implant.  Risks and benefits have been discussed.  She understands the risks and has agreed to the procedure.   Explained risks, benefits, and alternatives to PPM implantation, including but not limited to bleeding, infection, pneumothorax, pericardial effusion, lead dislodgement, heart attack, stroke, or death.  Pt verbalized understanding and agrees to proceed.     Will M. Camnitz MD 10/29/2023 11:51 AM      EP Attending  Patient seen and examined. Agree with the findings as noted above. The patient has symptomatic tachy-brady with long symptomatic pauses. She also has atrial fib with a RVR. I have discussed the indications/risks/benefits/goals/expectations of PPM insertion and removal of her ILR and she wishes to proceed.  Danelle Ismeal Heider,MD

## 2023-10-29 NOTE — ED Notes (Signed)
 Cardiac monitor showed period of asystole and pt c/o dizziness, rhythm strip printed and provided to [facility] MD.

## 2023-10-29 NOTE — Consult Note (Addendum)
 ELECTROPHYSIOLOGY CONSULT NOTE    Patient ID: [name] MRN: [id number], DOB/AGE: [birth year] 83 y.o.  Admit date: 10/28/2023 Date of Consult: 10/29/2023  Primary Physician: Rexanne Ingle, MD Primary Cardiologist: Jerel Balding, MD  Electrophysiologist: Dr. Inocencio   Referring Provider: Dr. Balding  Patient Profile: Dominique Griffith is a 83 y.o. female with a history of AF, HTN, GERD, arthritis who is being seen today for the evaluation of post conversion pauses at the request of Dr. Balding.  At baseline, she follows with Dr. Inocencio. She has an ILR in place for monitoring.  She underwent PVI, PW ablation with PF energy 11/2022 and CTI ablation. PPM had previously been discussed but she elected for attempted rhythm control with propafenone .   HPI:  Dominique Griffith is a 83 y.o. female who presented to [facility] on 10/28/23 with reports of palpitations, confusion and dizziness.   On presentation she reported she had an episode of dizziness & almost passed out.  She stated the episode lasted a few seconds and she returned to baseline.  She notes she had a similar episode while in hospital that was associated with post AFL conversion pause with several beats of junctional rhythm post. While in ER, she was noted to have AF with post conversion pauses lasting up to 5-6 seconds at at time.  The patient reports she has not felt well on propafenone  and would like to come off if possible.    She denies chest pain, dyspnea, PND, orthopnea, nausea, vomiting, syncope, edema, weight gain, or early satiety.   Labs Potassium3.9 (09/05 0515) Magnesium  2.0 (09/05 0515) Creatinine, ser  0.78 (09/05 0515) PLT  213 (09/05 0515) HGB  11.7* (09/05 0515) WBC 7.4 (09/05 0515)  .    Past Medical History:  Diagnosis Date   Anxiety    Complication of anesthesia    BACK SURGERY IN 2010; WAS PUT UNDER GENERAL ANESTHESIA ; REPORTS MEMORY LOSS AND CHANGE IN BEHAVIOR FOR 2 MONTHS POST-OP ; STILL  OCCASIONALLY HAS TIMES SHE FORGETS THINGS    Depression    GERD (gastroesophageal reflux disease)    Hypertension    Syncope and collapse    6 MONTHS AGO HAD SYNCOPE AND COLLAPSE ; SAW CARDIOLOGIST;  HAD UNREMARKABLE ECHO ;      Surgical History:  Past Surgical History:  Procedure Laterality Date   ATRIAL FIBRILLATION ABLATION N/A 12/24/2022   Procedure: ATRIAL FIBRILLATION ABLATION;  Surgeon: Inocencio Soyla Lunger, MD;  Location: [facility];  Service: Cardiovascular;  Laterality: N/A;   BACK SURGERY  2010   LUMBAR FUSION  WITH RODS IN PLACE   ESOPHAGOGASTRODUODENOSCOPY (EGD) WITH PROPOFOL  N/A 07/16/2020   Procedure: ESOPHAGOGASTRODUODENOSCOPY (EGD) WITH PROPOFOL ;  Surgeon: Burnette Fallow, MD;  Location: [facility];  Service: Endoscopy;  Laterality: N/A;   KNEE ARTHROPLASTY Right 04/22/2017   Procedure: RIGHT TOTAL KNEE ARTHROPLASTY WITH COMPUTER NAVIGATION;  Surgeon: Fidel Rogue, MD;  Location: [facility];  Service: Orthopedics;  Laterality: Right;  Needs RNFA   KNEE ARTHROPLASTY Left 06/10/2017   Procedure: LEFT TOTAL KNEE ARTHROPLASTY WITH COMPUTER NAVIGATION;  Surgeon: Fidel Rogue, MD;  Location: [facility];  Service: Orthopedics;  Laterality: Left;  Needs RNFA     Medications Prior to Admission  Medication Sig Dispense Refill Last Dose/Taking   apixaban  (ELIQUIS ) 5 MG TABS tablet Take 1 tablet (5 mg total) by mouth 2 (two) times daily. 180 tablet 3 10/28/2023 at  9:00 AM   baclofen  (LIORESAL ) 10 MG tablet  Take 1 tablet (10 mg total) by mouth 3 (three) times daily. (Patient taking differently: Take 10 mg by mouth at bedtime.) 270 tablet 3 10/27/2023   cetirizine (ZYRTEC) 10 MG tablet Take 10 mg by mouth at bedtime.   10/27/2023   clindamycin  (CLEOCIN ) 300 MG capsule Take 300 mg by mouth See admin instructions. Take prior to dental procedures   Unknown   estradiol  (ESTRACE ) 0.1 MG/GM vaginal cream Insert blueberry size amount of cream on finger (or 0.5 grams with applicator) in  vagina daily x 2 week then 2 x per week. (Patient taking differently: Place 0.5 g vaginally See admin instructions. Use a blueberry size (0.5g) of cream in vagina twice per week.) 42.5 g 3 10/25/2023   famotidine  (PEPCID ) 40 MG tablet Take 1 tablet (40 mg total) by mouth every evening. 90 tablet 1 10/27/2023   losartan  (COZAAR ) 100 MG tablet Take 1 tablet (100 mg total) by mouth daily for blood pressure. 90 tablet 3 10/28/2023 Morning   mirabegron  ER (MYRBETRIQ ) 50 MG TB24 tablet Take 1 tablet (50 mg total) by mouth daily. 30 tablet 6 10/28/2023 Morning   Misc Natural Products (GLUCOSAMINE CHOND MSM FORMULA PO) Take 1 tablet by mouth daily.   10/28/2023 Morning   Multiple Vitamins-Minerals (WOMENS DAILY FORMULA) TABS Take 1 tablet by mouth daily.   10/28/2023 Morning   naproxen sodium (ALEVE) 220 MG tablet Take 440 mg by mouth daily as needed (pain).   Past Month   Polyethyl Glycol-Propyl Glycol (SYSTANE) 0.4-0.3 % SOLN Place 1 drop into both eyes daily as needed (dry eyes).   10/28/2023 Morning   propafenone  (RYTHMOL  SR) 325 MG 12 hr capsule Take 1 capsule (325 mg total) by mouth 2 (two) times daily. 60 capsule 3 10/28/2023 Morning   sertraline  (ZOLOFT ) 100 MG tablet Take 1 tablet (100 mg total) by mouth daily. 90 tablet 1 10/28/2023 Morning   TART CHERRY ULTRA PO Take 1 tablet by mouth every other day. Take one tablet by mouth every other day.   10/27/2023    Inpatient Medications:   baclofen   10 mg Oral QHS   famotidine   40 mg Oral QPM   loratadine   10 mg Oral Daily   mirabegron  ER  50 mg Oral Daily   propafenone   300 mg Oral BID   sertraline   100 mg Oral Daily    Allergies:  Allergies  Allergen Reactions   Amoxicillin Rash    Has patient had a PCN reaction causing immediate rash, facial/tongue/throat swelling, SOB or lightheadedness with hypotension: No Has patient had a PCN reaction causing severe rash involving mucus membranes or skin necrosis: No Has patient had a PCN reaction that required  hospitalization: No Has patient had a PCN reaction occurring within the last 10 years: No If all of the above answers are NO, then may proceed with Cephalosporin use.    Penicillins Rash and Other (See Comments)    Has patient had a PCN reaction causing immediate rash, facial/tongue/throat swelling, SOB or lightheadedness with hypotension: No Has patient had a PCN reaction causing severe rash involving mucus membranes or skin necrosis: No Has patient had a PCN reaction that required hospitalization: No Has patient had a PCN reaction occurring within the last 10 years: No If all of the above answers are NO, then may proceed with Cephalosporin use.    Family History  Problem Relation Age of Onset   Stroke Mother    Heart attack Father      Physical Exam:  Vitals:   10/29/23 0532 10/29/23 0536 10/29/23 0606 10/29/23 0749  BP:  97/70 115/62 137/72  Pulse: 65 62 65 64  Resp: 17 19 18 20   Temp:   98.1 F (36.7 C) 97.9 F (36.6 C)  TempSrc:   Oral Oral  SpO2: 94% 95% 93% 97%  Weight:   78.6 kg   Height:        GEN- pleasant elderly adult female lying in bed in NAD, A&O x 3, normal affect   HEENT: Normocephalic, atraumatic Lungs- CTAB, Normal effort.  Heart- Regular rate and rhythm, No M/G/R.  GI- Soft, NT, ND.  Extremities- No clubbing, cyanosis, or edema   Radiology/Studies: MR BRAIN WO CONTRAST Result Date: 10/28/2023 EXAM: MRI BRAIN WITHOUT CONTRAST 10/28/2023 07:26:56 PM TECHNIQUE: Multiplanar multisequence MRI of the head/brain was performed without the administration of intravenous contrast. COMPARISON: 11/21/2016 CLINICAL HISTORY: Transient ischemic attack (TIA). FINDINGS: BRAIN AND VENTRICLES: No acute infarct. No intracranial hemorrhage. No mass. No midline shift. No hydrocephalus. The sella is unremarkable. Normal flow voids. Multifocal hyperintense T2-weighted signal within the cerebral white matter, most commonly due to chronic small vessel disease. ORBITS: No acute  abnormality. SINUSES AND MASTOIDS: No acute abnormality. BONES AND SOFT TISSUES: Normal marrow signal. No acute soft tissue abnormality. IMPRESSION: 1. No acute intracranial abnormality. 2. Multifocal hyperintense T2-weighted signal within the cerebral white matter, most commonly due to chronic small vessel disease. Electronically signed by: Franky Stanford MD 10/28/2023 07:54 PM EDT RP Workstation: [id number]   CT HEAD WO CONTRAST Result Date: 10/28/2023 CLINICAL DATA:  Neuro deficit, acute, stroke suspected. TIA EXAM: CT HEAD WITHOUT CONTRAST TECHNIQUE: Contiguous axial images were obtained from the base of the skull through the vertex without intravenous contrast. RADIATION DOSE REDUCTION: This exam was performed according to the departmental dose-optimization program which includes automated exposure control, adjustment of the mA and/or kV according to patient size and/or use of iterative reconstruction technique. COMPARISON:  CT head 10/15/2016 FINDINGS: Brain: No evidence of large-territorial acute infarction. No parenchymal hemorrhage. No mass lesion. No extra-axial collection. No mass effect or midline shift. No hydrocephalus. Basilar cisterns are patent. Vascular: No hyperdense vessel. Atherosclerotic calcifications are present within the cavernous internal carotid arteries. Skull: No acute fracture or focal lesion. Sinuses/Orbits: Paranasal sinuses and mastoid air cells are clear. Bilateral lens replacement. Otherwise the orbits are unremarkable. Other: None. IMPRESSION: No acute intracranial abnormality. Electronically Signed   By: Morgane  Naveau M.D.   On: 10/28/2023 19:19   CUP PACEART REMOTE DEVICE CHECK Result Date: 10/04/2023 ILR summary report received. Battery status OK. Normal device function. No new tachy, brady, episodes. 6 new AF episodes, longest duration 4hrs , overall controlled rates, burden 1.2%, Eliquis  per PA report. Monthly summary reports and ROV/PRN.  Presenting rhythm SB 5  symptom activation's, 2 correlating with AF events, one correlating with 09/28/23 5sec conversion pause previously reviewed and routed.  Pt has been scheduled 8/19 for possible PPM implant Additional symptoms c/w burst of AF or tachy prior to activaiton LA, CVRS.   EKG:  AF with RVR, 129 bpm(personally reviewed)  TELEMETRY: SR 60-70's (personally reviewed)  DEVICE HISTORY:  MDT ILR implanted 07/01/23 for AF monitoring     Arrhythmia / AAD / Pertinent EP Studies AF  EPS 11/2022   Propafenone     Assessment/Plan:  Tachy-Brady Syndrome  Persistent Atrial Fibrillation  High Risk Medication Monitoring: Propafenone   -ILR notable for 57% burden AF for current monitoring period (8/29-9/5) with episodes of  post conversion pauses (see phone  note for full episode) -update ECHO, bedside LVEF looked normal by my review during ECHO  -NPO for PPM  -ok for clear liquid diet now and then NPO -Eliquis  last dose on 9/4 am -risks / benefits discussed with patient per Dr. Inocencio  -anticipate stopping propafenone  post PPM insertion   Secondary Hypercoagulable State  -hold PTA Eliquis  for PPM implant       For questions or updates, please contact [facility] Please consult [email or web address] for contact info under     Signed, Daphne Barrack, NP-C, AGACNP-BC [facility] - Electrophysiology  10/29/2023, 9:25 AM      I have seen and examined this patient with Daphne Barrack.  Agree with above, note added to reflect my findings.  Patient with a history of atrial fibrillation.  She is post ablation.  She continues to have short runs of atrial fibrillation complicated by postconversion pauses.  She is near syncopal during these pauses.  She presented to the hospital due to recurrent episodes of dizziness.  GEN: No acute distress.   Neck: No JVD Cardiac: RRR, no murmurs, rubs, or gallops.  Respiratory: normal BS bases bilaterally. GI: Soft, nontender, non-distended  MS: No edema; No  deformity. Neuro:  Nonfocal  Skin: warm and dry Psych: Normal affect    Tachybradycardia syndrome Persistent atrial fibrillation Patient continues to have episodes of atrial fibrillation and postconversion pauses.  She would benefit from rhythm control as her atrial fibrillation is quite fast.  Due to this, we Denard Tuminello plan for pacemaker implant.  Risks and benefits have been discussed.  She understands the risks and has agreed to the procedure.  Explained risks, benefits, and alternatives to PPM implantation, including but not limited to bleeding, infection, pneumothorax, pericardial effusion, lead dislodgement, heart attack, stroke, or death.  Pt verbalized understanding and agrees to proceed.   Averill Pons M. Boluwatife Mutchler MD 10/29/2023 11:51 AM

## 2023-10-29 NOTE — ED Notes (Addendum)
 Noticed patient heat rate reading asystole. This nurse reported to bedside and patient stated she felt dizzy and family at bedside stated her eyes were closed like she passed out. MD Countryman reported to bedside and completed an assessment. Zoll pads and zoll applied to patient.

## 2023-10-29 NOTE — H&P (Signed)
 Cardiology Admission History and Physical   Patient ID: Dominique Griffith MRN: [id number]; DOB: 01-13-1941   Admission date: 10/28/2023  PCP:  Rexanne Ingle, MD   [facility] Providers Cardiologist:  Jerel Balding, MD  Electrophysiologist:  Will Gladis Norton, MD  {  Chief Complaint:  Dizziness and near syncopal episode  Patient Profile: Dominique Griffith is a 83 y.o. female with paroxysmal afib, htn, GERD, arthritis who is being seen 10/29/2023 for the evaluation of conversion sinus pause.  History of Present Illness: Dominique Griffith presented to the ED with a near syncope event. She states that she developed palpitations along with a confused feeling of dizziness. This lasted a few seconds (less than a minutes), and she then returned to baseline. She had a similar event here in the hospital which was associated with a post aflutter conversion sinus pause and several beats of a junctional rhythm.   The patient follows with EP Dr. Norton and also follows with cardiologist, Dr. Balding. These conversion pauses are well known to her EP team as she has an IRL in place. Pacemaker has been discussed, and the patient elected for anti-rhythmic treatment with propafenone . The patient started propafenone  on 10/12/2023 which has been uptitrated to 325 mg bid. The patient dose not like the way the propafenone  has made her feel and is now considering ppm placement. Ideally, she would like to stop the propafenone .   Past Medical History:  Diagnosis Date   Anxiety    Complication of anesthesia    BACK SURGERY IN 2010; WAS PUT UNDER GENERAL ANESTHESIA ; REPORTS MEMORY LOSS AND CHANGE IN BEHAVIOR FOR 2 MONTHS POST-OP ; STILL OCCASIONALLY HAS TIMES SHE FORGETS THINGS    Depression    GERD (gastroesophageal reflux disease)    Hypertension    Syncope and collapse    6 MONTHS AGO HAD SYNCOPE AND COLLAPSE ; SAW CARDIOLOGIST;  HAD UNREMARKABLE ECHO ;    Past Surgical History:  Procedure Laterality  Date   ATRIAL FIBRILLATION ABLATION N/A 12/24/2022   Procedure: ATRIAL FIBRILLATION ABLATION;  Surgeon: Norton Soyla Gladis, MD;  Location: [facility];  Service: Cardiovascular;  Laterality: N/A;   BACK SURGERY  2010   LUMBAR FUSION  WITH RODS IN PLACE   ESOPHAGOGASTRODUODENOSCOPY (EGD) WITH PROPOFOL  N/A 07/16/2020   Procedure: ESOPHAGOGASTRODUODENOSCOPY (EGD) WITH PROPOFOL ;  Surgeon: Burnette Fallow, MD;  Location: [facility];  Service: Endoscopy;  Laterality: N/A;   KNEE ARTHROPLASTY Right 04/22/2017   Procedure: RIGHT TOTAL KNEE ARTHROPLASTY WITH COMPUTER NAVIGATION;  Surgeon: Fidel Rogue, MD;  Location: [facility];  Service: Orthopedics;  Laterality: Right;  Needs RNFA   KNEE ARTHROPLASTY Left 06/10/2017   Procedure: LEFT TOTAL KNEE ARTHROPLASTY WITH COMPUTER NAVIGATION;  Surgeon: Fidel Rogue, MD;  Location: [facility];  Service: Orthopedics;  Laterality: Left;  Needs RNFA     Medications Prior to Admission: Prior to Admission medications   Medication Sig Start Date End Date Taking? Authorizing Provider  apixaban  (ELIQUIS ) 5 MG TABS tablet Take 1 tablet (5 mg total) by mouth 2 (two) times daily. 09/13/23  Yes   baclofen  (LIORESAL ) 10 MG tablet Take 1 tablet (10 mg total) by mouth 3 (three) times daily. Patient taking differently: Take 10 mg by mouth 3 (three) times daily. 11/19/22  Yes   cetirizine (ZYRTEC) 10 MG tablet Take 10 mg by mouth daily.   Yes [provider]  famotidine  (PEPCID ) 40 MG tablet Take 1 tablet (40 mg total) by mouth every evening. 09/13/23  Yes   losartan  (COZAAR ) 100 MG tablet Take 1 tablet (100 mg total) by mouth daily for blood pressure. 11/16/22  Yes   mirabegron  ER (MYRBETRIQ ) 50 MG TB24 tablet Take 1 tablet (50 mg total) by mouth daily. 04/22/23  Yes   Misc Natural Products (GLUCOSAMINE CHOND MSM FORMULA PO) Take 1 tablet by mouth daily.   Yes [provider]  Multiple Vitamins-Minerals (WOMENS DAILY FORMULA) TABS Take 1 tablet by mouth  daily.   Yes [provider]  propafenone  (RYTHMOL  SR) 325 MG 12 hr capsule Take 1 capsule (325 mg total) by mouth 2 (two) times daily. 10/20/23  Yes Terra Fairy PARAS, PA-C  sertraline  (ZOLOFT ) 100 MG tablet Take 1 tablet (100 mg total) by mouth daily. 08/16/23  Yes   clindamycin  (CLEOCIN ) 300 MG capsule Take 300 mg by mouth See admin instructions. Take prior to dental procedures    [provider]  estradiol  (ESTRACE ) 0.1 MG/GM vaginal cream Insert blueberry size amount of cream on finger (or 0.5 grams with applicator) in vagina daily x 2 week then 2 x per week. 04/22/23     olopatadine (PATANOL) 0.1 % ophthalmic solution Place 1 drop into both eyes 2 (two) times daily as needed for allergies.    [provider]  Polyethyl Glycol-Propyl Glycol (SYSTANE) 0.4-0.3 % SOLN Place 1 drop into both eyes daily as needed (dry eyes).    [provider]  TART CHERRY ULTRA PO Take 1 capsule by mouth daily. With Turmeric    [provider]  Turmeric (QC TUMERIC COMPLEX) 500 MG CAPS Take by mouth.    [provider]     Allergies:    Allergies  Allergen Reactions   Amoxicillin Rash    Has patient had a PCN reaction causing immediate rash, facial/tongue/throat swelling, SOB or lightheadedness with hypotension: No Has patient had a PCN reaction causing severe rash involving mucus membranes or skin necrosis: No Has patient had a PCN reaction that required hospitalization: No Has patient had a PCN reaction occurring within the last 10 years: No If all of the above answers are NO, then may proceed with Cephalosporin use.    Penicillins Rash and Other (See Comments)    Has patient had a PCN reaction causing immediate rash, facial/tongue/throat swelling, SOB or lightheadedness with hypotension: No Has patient had a PCN reaction causing severe rash involving mucus membranes or skin necrosis: No Has patient had a PCN reaction that required hospitalization:  No Has patient had a PCN reaction occurring within the last 10 years: No If all of the above answers are NO, then may proceed with Cephalosporin use.    Social History:   Social History   Socioeconomic History   Marital status: Married    Spouse name: Not on file   Number of children: Not on file   Years of education: Not on file   Highest education level: Not on file  Occupational History   Not on file  Tobacco Use   Smoking status: Former    Types: Cigarettes   Smokeless tobacco: Never   Tobacco comments:    Former smoker (QUIT 30 YEARS AGO) 01/14/23  Vaping Use   Vaping status: Never Used  Substance and Sexual Activity   Alcohol  use: Yes    Alcohol /week: 7.0 - 14.0 standard drinks of alcohol     Types: 7 - 14 Shots of liquor per week    Comment: 1-2 shot nightly 01/14/23   Drug use: No  Sexual activity: Not on file  Other Topics Concern   Not on file  Social History Narrative   Not on file   Social Drivers of Health   Financial Resource Strain: Not on file  Food Insecurity: Not on file  Transportation Needs: Not on file  Physical Activity: Not on file  Stress: Not on file  Social Connections: Unknown (07/08/2021)   Received from [facility]   Social Network    Social Network: Not on file  Intimate Partner Violence: Unknown (05/30/2021)   Received from [facility]   HITS    Physically Hurt: Not on file    Insult or Talk Down To: Not on file    Threaten Physical Harm: Not on file    Scream or Curse: Not on file     Family History:   The patient's family history includes Heart attack in her father; Stroke in her mother.    ROS:  Please see the history of present illness.  All other ROS reviewed and negative.     Physical Exam/Data: Vitals:   10/29/23 0155 10/29/23 0211 10/29/23 0230 10/29/23 0242  BP: (!) 112/94  (!) 111/55   Pulse: 72  65 66  Resp: 14  17 17   Temp:  98 F (36.7 C)    TempSrc:  Oral    SpO2: 94%  91% 93%  Weight:       Height:       No intake or output data in the 24 hours ending 10/29/23 0338    10/28/2023    6:06 PM 10/27/2023    3:24 PM 10/20/2023    2:15 PM  Last 3 Weights  Weight (lbs) 170 lb 176 lb 173 lb 3.2 oz  Weight (kg) 77.111 kg 79.833 kg 78.563 kg     Body mass index is 30.11 kg/m.  General:  Well nourished, well developed, in no acute distress HEENT: normal Neck: no JVD Vascular: No carotid bruits; Distal pulses 2+ bilaterally   Cardiac:  normal S1, S2; RRR; no murmur  Lungs:  clear to auscultation bilaterally, no wheezing, rhonchi or rales  Abd: soft, nontender, no hepatomegaly  Ext: no edema Musculoskeletal:  No deformities, BUE and BLE strength normal and equal Skin: warm and dry  Neuro:  CNs 2-12 intact, no focal abnormalities noted Psych:  Normal affect   EKG:  The ECG that was done was personally reviewed and demonstrates sinus rhythm with frequent PACs, nonspecific STT wave changes  Telemetry: aflutter with variable AV block with post conversion sinus pause and junctional rhythm  Relevant CV Studies: EPS  12/24/2022 CONCLUSIONS: 1. Sinus rhythm upon presentation.   2. Successful electrical isolation and anatomical encircling of all four pulmonary veins with pulsed field ablation. 3. Posterior wall isolation using pulse field ablation 4. Cavotricuspid isthmus ablation 5. No early apparent complications.  TTE 02/22/2017 Study Conclusions  - Left ventricle: The cavity size was normal. There was mild    concentric hypertrophy. Systolic function was vigorous. The    estimated ejection fraction was in the range of 65% to 70%. Wall    motion was normal; there were no regional wall motion    abnormalities. Doppler parameters are consistent with abnormal    left ventricular relaxation (grade 1 diastolic dysfunction).    Doppler parameters are consistent with high ventricular filling    pressure.  - Aortic valve: Transvalvular velocity was within the normal range.     There was no stenosis. There was mild regurgitation.  -  Mitral valve: Transvalvular velocity was within the normal range.    There was no evidence for stenosis. There was trivial    regurgitation.  - Right ventricle: The cavity size was normal. Wall thickness was    normal. Systolic function was normal.   ILR Interrogation 08/02/2023 Normal implantable loop recorder download. Programmed parameters are appropriate. Low burden of paroxysmal atrial fibrillation (0.2%), mostly rate controlled, occasional RVR. The episodes are generally very brief (approximately 20 episodes lasting usually 2-6 minutes, maximum 16 minutes). There is a 5.4 second post-conversion pause 07/22/2023, 2200h. There is a 4.2 second post-conversion pause 07/10/2023, 2231h. There is  post-conversion severe sinus bradycardia (versus second degree SA block) at 35 bpm, lasting for a few seconds on 07/26/2023, 2031h. Normal battery status.   Findings are consistent with tachycardia-bradycardia syndrome. The episodes of paroxysmal atrial fibrillation are much shorter than pre-ablation, but are still occurring. Will discuss further management with Dr. Inocencio. She had an ablation in November (CTI and PVI).   09/02/2023 Normal implantable loop recorder download. Programmed parameters are appropriate. Low burden of paroxysmal atrial fibrillation (4 episodes, longest 4 hours, overall prevalence 1%) with adequate rate control.  Also very brief episodes of atypical atrial flutter with faster ventricular rates A single post termination pause lasting 5 seconds seen just before 11 PM on 08/06/2023. No symptoms recorded. Normal battery status.  10/03/2023  Laboratory Data:  Chemistry Recent Labs  Lab 10/28/23 1830 10/28/23 1837  NA 137 136  K 4.3 4.3  CL 103 106  CO2 21*  --   GLUCOSE 92 91  BUN 15 19  CREATININE 0.88 0.90  CALCIUM 9.3  --   GFRNONAA >60  --   ANIONGAP 13  --     Recent Labs  Lab 10/28/23 1830  PROT  6.6  ALBUMIN 4.0  AST 55*  ALT 65*  ALKPHOS 91  BILITOT 0.7   Hematology Recent Labs  Lab 10/28/23 1830 10/28/23 1837  WBC 7.7  --   RBC 3.68*  --   HGB 12.2 11.9*  HCT 37.2 35.0*  MCV 101.1*  --   MCH 33.2  --   MCHC 32.8  --   RDW 12.2  --   PLT 221  --    Radiology/Studies:  MR BRAIN WO CONTRAST Result Date: 10/28/2023 EXAM: MRI BRAIN WITHOUT CONTRAST 10/28/2023 07:26:56 PM TECHNIQUE: Multiplanar multisequence MRI of the head/brain was performed without the administration of intravenous contrast. COMPARISON: 11/21/2016 CLINICAL HISTORY: Transient ischemic attack (TIA). FINDINGS: BRAIN AND VENTRICLES: No acute infarct. No intracranial hemorrhage. No mass. No midline shift. No hydrocephalus. The sella is unremarkable. Normal flow voids. Multifocal hyperintense T2-weighted signal within the cerebral white matter, most commonly due to chronic small vessel disease. ORBITS: No acute abnormality. SINUSES AND MASTOIDS: No acute abnormality. BONES AND SOFT TISSUES: Normal marrow signal. No acute soft tissue abnormality. IMPRESSION: 1. No acute intracranial abnormality. 2. Multifocal hyperintense T2-weighted signal within the cerebral white matter, most commonly due to chronic small vessel disease. Electronically signed by: Franky Stanford MD 10/28/2023 07:54 PM EDT RP Workstation: [id number]   CT HEAD WO CONTRAST Result Date: 10/28/2023 CLINICAL DATA:  Neuro deficit, acute, stroke suspected. TIA EXAM: CT HEAD WITHOUT CONTRAST TECHNIQUE: Contiguous axial images were obtained from the base of the skull through the vertex without intravenous contrast. RADIATION DOSE REDUCTION: This exam was performed according to the departmental dose-optimization program which includes automated exposure control, adjustment of the mA and/or kV according to patient size and/or use of  iterative reconstruction technique. COMPARISON:  CT head 10/15/2016 FINDINGS: Brain: No evidence of large-territorial acute infarction. No  parenchymal hemorrhage. No mass lesion. No extra-axial collection. No mass effect or midline shift. No hydrocephalus. Basilar cisterns are patent. Vascular: No hyperdense vessel. Atherosclerotic calcifications are present within the cavernous internal carotid arteries. Skull: No acute fracture or focal lesion. Sinuses/Orbits: Paranasal sinuses and mastoid air cells are clear. Bilateral lens replacement. Otherwise the orbits are unremarkable. Other: None. IMPRESSION: No acute intracranial abnormality. Electronically Signed   By: Morgane  Naveau M.D.   On: 10/28/2023 19:19   Assessment and Plan: Paroxysmal Afib/flutter s/p PVI, PWI, and CTI ablation using pulse field ablation Tachy-Brady syndrome  Dizziness Near syncope Stroke prevention The patient presented with a dizziness and confused episode that lasted a few seconds at a time. She had a similar episode here in the ED that was associated with a post conversion sinus pause and junctional escape. I suspect that her IRL interrogation will show a similar post conversion pause at 4pm when this episode occurred. I discussed at length with the patient and her family the post conversion pause and possible treatment options including a permament pacemaker. We discussed the traditional ppm along with leadless ppm, and the patient would like to discuss these possibilities with the inpatient electrophysiologist in the morning. In the meantime, she did receive a dose of propafenone  in the ED but is hoping to stop this medication. Given the possibility of ppm, I will hold her DOAC and make her NPO. We also did discuss the option of further afib treatment including possible redo ablation as she had prior PVI/PWI/CTI ablation about a year ago and continues to have short runs of paroxysmal afib/aflutter. She is hoping to discuss her options with the EP team in the AM.   - NPO for possible permament pacemaker  - Hold AM Eliquis  pending ppm discussion - Continue  propafenone  for now - IRL interrogation in AM - TTE in AM  6. Hypertension Will hold antihypertensives given soft BP in the ED.  Risk Assessment/Risk Scores:  CHA2DS2-VASc Score = 5   This indicates a 7.2% annual risk of stroke. The patient's score is based upon: CHF History: 0 HTN History: 1 Diabetes History: 0 Stroke History: 0 Vascular Disease History: 1 Age Score: 2 Gender Score: 1  Code Status: Full Code  Severity of Illness: The appropriate patient status for this patient is INPATIENT. Inpatient status is judged to be reasonable and necessary in order to provide the required intensity of service to ensure the patient's safety. The patient's presenting symptoms, physical exam findings, and initial radiographic and laboratory data in the context of their chronic comorbidities is felt to place them at high risk for further clinical deterioration. Furthermore, it is not anticipated that the patient will be medically stable for discharge from the hospital within 2 midnights of admission.   * I certify that at the point of admission it is my clinical judgment that the patient will require inpatient hospital care spanning beyond 2 midnights from the point of admission due to high intensity of service, high risk for further deterioration and high frequency of surveillance required.*  For questions or updates, please contact [facility] Please consult [email or web address] for contact info under     Signed, Jerrell DELENA Orchard, MD  10/29/2023 3:38 AM

## 2023-10-29 NOTE — Telephone Encounter (Signed)
 ILR alert received:  Alert remote transmission: Symptom + detection Pause occurred 10/28/23 @ 16:28, duration 7sec per device, inactive, EGM c/w sinus pause.  Symptom 9/7 @ 16:32 - route to triage high alert per protocol.  Program pause alert on Second pause 9/2 @ 02:21, duration 7sec per device, c/w sinus pause

## 2023-10-29 NOTE — Discharge Summary (Addendum)
 ELECTROPHYSIOLOGY PROCEDURE DISCHARGE SUMMARY    Patient ID: Dominique Griffith,  MRN: [id number], DOB/AGE: Apr 03, 1940 83 y.o.  Admit date: 10/28/2023 Discharge date: 10/30/2023  Primary Care Physician: Dominique Ingle, MD  Primary Cardiologist: Dominique Balding, MD  Electrophysiologist: Dr. Inocencio   Primary Discharge Diagnosis:  Symptomatic bradycardia status post pacemaker implantation this admission  Secondary Discharge Diagnosis:  Secondary Hypercoagulable State  Allergies  Allergen Reactions   Amoxicillin Rash    Has patient had a PCN reaction causing immediate rash, facial/tongue/throat swelling, SOB or lightheadedness with hypotension: No Has patient had a PCN reaction causing severe rash involving mucus membranes or skin necrosis: No Has patient had a PCN reaction that required hospitalization: No Has patient had a PCN reaction occurring within the last 10 years: No If all of the above answers are NO, then may proceed with Cephalosporin use.    Penicillins Rash and Other (See Comments)    Has patient had a PCN reaction causing immediate rash, facial/tongue/throat swelling, SOB or lightheadedness with hypotension: No Has patient had a PCN reaction causing severe rash involving mucus membranes or skin necrosis: No Has patient had a PCN reaction that required hospitalization: No Has patient had a PCN reaction occurring within the last 10 years: No If all of the above answers are NO, then may proceed with Cephalosporin use.     Procedures This Admission:  1.  Implantation of a Medtronic Dual Chamber PPM on 10/29/2023 by Dr. Waddell (Dr. Inocencio Dominique Griffith). The patient received a Medtronic Azure XT DR [id number]  with a Medtronic CapSureFix Novus 5076 right atrial lead and a Medtronic CapSureFix Novus 5076 right ventricular lead.  There were no immediate post procedure complications.   2.  CXR on 10/30/2023 demonstrated no pneumothorax status post device implantation.  3.  ECHO:  10/29/2023  1. Left ventricular ejection fraction, by estimation, is 60 to 65%. The  left ventricle has normal function. The left ventricle has no regional  wall motion abnormalities. There is mild left ventricular hypertrophy.  Left ventricular diastolic parameters are consistent with Grade II diastolic dysfunction (pseudonormalization). Elevated left ventricular end-diastolic pressure.   2. Right ventricular systolic function is normal. The right ventricular  size is normal.   3. Left atrial size was mildly dilated.   4. A small pericardial effusion is present. The pericardial effusion is  anterior to the right ventricle.   5. The mitral valve is abnormal. Mild mitral valve regurgitation. No  evidence of mitral stenosis.   6. The aortic valve is tricuspid. There is mild calcification of the  aortic valve. There is mild thickening of the aortic valve. Aortic valve  regurgitation is mild. Aortic valve sclerosis is present, with no evidence of aortic valve stenosis.   7. The inferior vena cava is normal in size with greater than 50%  respiratory variability, suggesting right atrial pressure of 3 mmHg.   DG Chest 2 View Result Date: 10/30/2023 CLINICAL DATA:  Pacemaker placement. EXAM: CHEST - 2 VIEW COMPARISON:  October 15, 2016. FINDINGS: Stable cardiomediastinal silhouette. Left-sided pacemaker is noted with leads in grossly good position. No pneumothorax or pleural effusion is noted. No acute pulmonary disease is noted. Bony thorax is unremarkable. IMPRESSION: Left-sided pacemaker is noted with leads in grossly good position. No pneumothorax is noted. Electronically Signed   By: Dominique Griffith M.D.   On: 10/30/2023 10:25   EP PPM/ICD IMPLANT Result Date: 10/29/2023 CONCLUSIONS:  1. Successful implantation of a [employer] dual-chamber  pacemaker for symptomatic bradycardia due to tachy-brady syndrome  2. Successful ILR removal 3. No early apparent complications.       Dominique Birmingham, MD  10/29/2023 2:05 PM         Brief HPI: Dominique Griffith is a 83 y.o. female who was admitted for near syncope, dizziness in the setting of AF with post-conversion pauses and electrophysiology team asked to see for consideration of PPM implantation.  Past medical history includes AF with post-conversion pauses, syncope, HTN, GERD, depression / anxiety.  The patient has had tachy-brady  without reversible causes identified. Risks, benefits, and alternatives to PPM implantation were reviewed with the patient who wished to proceed. ILR was removed on 10/29/23 after PPM implant.  Hospital Course:  The patient was admitted and underwent implantation of a Medtronic dual chamber PPM with details as outlined above.  She was monitored on telemetry overnight which demonstrated appropriate pacing.  Left chest was without hematoma or ecchymosis.  The device was interrogated and found to be functioning normally.  CXR was obtained and demonstrated no pneumothorax status post device implantation.  Wound care, arm mobility, and restrictions were reviewed with the patient.    She hopes to get off the propafenone , but was asked to continue it for now since her AF burden was 57% pre-PPM.  The patient was examined and considered stable for discharge to home.    Anticoagulation resumption This patient should resume their Eliquis  on 11/03/2023    Physical Exam: Vitals:   10/29/23 2000 10/29/23 2324 10/30/23 0724 10/30/23 1002  BP: (!) 145/65 (!) 120/58 (!) 125/59 (!) 127/100  Pulse: 70 64 61 72  Resp: 19 14 14    Temp: 98.3 F (36.8 C) 98.3 F (36.8 C) 97.9 F (36.6 C)   TempSrc: Oral Oral Oral   SpO2: 95% 94% 93%   Weight:      Height:        GEN- NAD. A&O x 3.  HEENT: Normocephalic, atraumatic Lungs- CTAB, Normal effort.  Heart- RRR, No M/G/R.  GI- Soft, NT, ND.  Extremities- No clubbing, cyanosis, or edema;  Skin- warm and dry, no rash or lesion, left chest without hematoma/ecchymosis  Discharge  Medications:  Allergies as of 10/30/2023       Reactions   Amoxicillin Rash   Has patient had a PCN reaction causing immediate rash, facial/tongue/throat swelling, SOB or lightheadedness with hypotension: No Has patient had a PCN reaction causing severe rash involving mucus membranes or skin necrosis: No Has patient had a PCN reaction that required hospitalization: No Has patient had a PCN reaction occurring within the last 10 years: No If all of the above answers are NO, then may proceed with Cephalosporin use.   Penicillins Rash, Other (See Comments)   Has patient had a PCN reaction causing immediate rash, facial/tongue/throat swelling, SOB or lightheadedness with hypotension: No Has patient had a PCN reaction causing severe rash involving mucus membranes or skin necrosis: No Has patient had a PCN reaction that required hospitalization: No Has patient had a PCN reaction occurring within the last 10 years: No If all of the above answers are NO, then may proceed with Cephalosporin use.        Medication List     TAKE these medications    baclofen  10 MG tablet Commonly known as: LIORESAL  Take 1 tablet (10 mg total) by mouth 3 (three) times daily. What changed: when to take this   cetirizine 10 MG tablet Commonly known as: ZYRTEC  Take 10 mg by mouth at bedtime.   clindamycin  300 MG capsule Commonly known as: CLEOCIN  Take 300 mg by mouth See admin instructions. Take prior to dental procedures   Eliquis  5 MG Tabs tablet Generic drug: apixaban  Take 1 tablet (5 mg total) by mouth 2 (two) times daily.   estradiol  0.1 MG/GM vaginal cream Commonly known as: ESTRACE  Insert blueberry size amount of cream on finger (or 0.5 grams with applicator) in vagina daily x 2 week then 2 x per week. What changed:  how much to take when to take this additional instructions   famotidine  40 MG tablet Commonly known as: PEPCID  Take 1 tablet (40 mg total) by mouth every evening.    GLUCOSAMINE CHOND MSM FORMULA PO Take 1 tablet by mouth daily.   losartan  100 MG tablet Commonly known as: COZAAR  Take 1 tablet (100 mg total) by mouth daily for blood pressure.   metoprolol  succinate 50 MG 24 hr tablet Commonly known as: TOPROL -XL Take 1 tablet (50 mg total) by mouth daily. Take with or immediately following a meal.   Myrbetriq  50 MG Tb24 tablet Generic drug: mirabegron  ER Take 1 tablet (50 mg total) by mouth daily.   naproxen sodium 220 MG tablet Commonly known as: ALEVE Take 440 mg by mouth daily as needed (pain).   propafenone  325 MG 12 hr capsule Commonly known as: RYTHMOL  SR Take 1 capsule (325 mg total) by mouth 2 (two) times daily.   sertraline  100 MG tablet Commonly known as: ZOLOFT  Take 1 tablet (100 mg total) by mouth daily.   Systane 0.4-0.3 % Soln Generic drug: Polyethyl Glycol-Propyl Glycol Place 1 drop into both eyes daily as needed (dry eyes).   TART CHERRY ULTRA PO Take 1 tablet by mouth every other day. Take one tablet by mouth every other day.   Womens Daily Formula Tabs Take 1 tablet by mouth daily.        Disposition:  Home with usual Griffith up as in AVS   Duration of Discharge Encounter:  APP time: 36 minutes  Signed, Dominique Shad, PA-C 10/30/2023 10:57 AM   I have seen and examined this patient with Dominique Griffith.  Agree with above, note added to reflect my findings.  On exam, regular rhythm, no murmurs.  She is now status post Medtronic pacemaker for tachybradycardia syndrome..  Device functioning appropriately.  Sensing, threshold, impedance within normal limits as expected post implant.  Chest x-ray and interrogation without issue.  Plan for discharge today with Griffith-up in device clinic.    Tamera Pingley M. Dacotah Cabello MD 10/30/2023 11:04 AM

## 2023-10-30 ENCOUNTER — Inpatient Hospital Stay (HOSPITAL_COMMUNITY)

## 2023-10-30 ENCOUNTER — Other Ambulatory Visit (HOSPITAL_COMMUNITY): Payer: Self-pay

## 2023-10-30 ENCOUNTER — Other Ambulatory Visit: Payer: Self-pay

## 2023-10-30 MED ORDER — METOPROLOL SUCCINATE ER 50 MG PO TB24
50.0000 mg | ORAL_TABLET | Freq: Every day | ORAL | Status: DC
  Administered 2023-10-30: 50 mg via ORAL
  Filled 2023-10-30: qty 1

## 2023-10-30 MED ORDER — METOPROLOL SUCCINATE ER 50 MG PO TB24
50.0000 mg | ORAL_TABLET | Freq: Every day | ORAL | 6 refills | Status: AC
  Filled 2023-10-30: qty 30, 30d supply, fill #0
  Filled 2024-01-28: qty 90, 90d supply, fill #0

## 2023-10-30 NOTE — Progress Notes (Signed)
  Progress Note  Patient Name: Dominique Griffith Date of Encounter: 10/30/2023 [facility] Cardiologist: Jerel Balding, MD   Interval Summary   Patient feeling well without acute complaint.  Vital Signs Vitals:   10/29/23 1723 10/29/23 2000 10/29/23 2324 10/30/23 0724  BP: (!) 113/58 (!) 145/65 (!) 120/58 (!) 125/59  Pulse: 66 70 64 61  Resp: 17 19 14 14   Temp: 97.7 F (36.5 C) 98.3 F (36.8 C) 98.3 F (36.8 C) 97.9 F (36.6 C)  TempSrc: Oral Oral Oral Oral  SpO2: 96% 95% 94% 93%  Weight:      Height:       No intake or output data in the 24 hours ending 10/30/23 0859    10/29/2023    6:06 AM 10/28/2023    6:06 PM 10/27/2023    3:24 PM  Last 3 Weights  Weight (lbs) 173 lb 4.5 oz 170 lb 176 lb  Weight (kg) 78.6 kg 77.111 kg 79.833 kg      Telemetry/ECG  Atrial paced- Personally Reviewed  Physical Exam  GEN: No acute distress.   Neck: No JVD Cardiac: RRR, no murmurs, rubs, or gallops.  Respiratory: Clear to auscultation bilaterally. GI: Soft, nontender, non-distended  MS: No edema  Assessment & Plan   1.  Tachybradycardia syndrome vascular 2.  Persistent atrial fibrillation  Continues to have postconversion pauses.  Now post Medtronic dual-chamber pacemaker.  Device functioning appropriately.  Awaiting on chest x-ray.  If x-ray is without pneumothorax, Brenlynn Fake discharge home.  Leeba Barbe start metoprolol  today.     For questions or updates, please contact [facility] Please consult [email or web address] for contact info under       Signed, Dinara Lupu Gladis Norton, MD

## 2023-10-30 NOTE — Progress Notes (Signed)
 Pt alert and oriented at discharge. Telemetry removed, and IVs removed. Husband at the bedside to take pt home. AVS and medications reviewed with pt. RN educated pt on weight bearing restrictions for left arm. Pt educated on taking care of incision site, and staying away from things with magnets that could affect her pacemaker. Wheeled out by NA.

## 2023-10-30 NOTE — Plan of Care (Signed)
 Problem: Education: Goal: Knowledge of General Education information will improve Description: Including pain rating scale, medication(s)/side effects and non-pharmacologic comfort measures 10/30/2023 1046 by Cleotilde Bernice FALCON, RN Outcome: Adequate for Discharge 10/30/2023 1046 by Cleotilde Bernice FALCON, RN Outcome: Progressing   Problem: Health Behavior/Discharge Planning: Goal: Ability to manage health-related needs will improve 10/30/2023 1046 by Cleotilde Bernice FALCON, RN Outcome: Adequate for Discharge 10/30/2023 1046 by Cleotilde Bernice FALCON, RN Outcome: Progressing   Problem: Clinical Measurements: Goal: Ability to maintain clinical measurements within normal limits will improve 10/30/2023 1046 by Cleotilde Bernice FALCON, RN Outcome: Adequate for Discharge 10/30/2023 1046 by Cleotilde Bernice FALCON, RN Outcome: Progressing Goal: Will remain free from infection 10/30/2023 1046 by Cleotilde Bernice FALCON, RN Outcome: Adequate for Discharge 10/30/2023 1046 by Cleotilde Bernice FALCON, RN Outcome: Progressing Goal: Diagnostic test results will improve 10/30/2023 1046 by Cleotilde Bernice FALCON, RN Outcome: Adequate for Discharge 10/30/2023 1046 by Cleotilde Bernice FALCON, RN Outcome: Progressing Goal: Respiratory complications will improve 10/30/2023 1046 by Cleotilde Bernice FALCON, RN Outcome: Adequate for Discharge 10/30/2023 1046 by Cleotilde Bernice FALCON, RN Outcome: Progressing Goal: Cardiovascular complication will be avoided 10/30/2023 1046 by Cleotilde Bernice FALCON, RN Outcome: Adequate for Discharge 10/30/2023 1046 by Cleotilde Bernice FALCON, RN Outcome: Progressing   Problem: Activity: Goal: Risk for activity intolerance will decrease 10/30/2023 1046 by Cleotilde Bernice FALCON, RN Outcome: Adequate for Discharge 10/30/2023 1046 by Cleotilde Bernice FALCON, RN Outcome: Progressing   Problem: Nutrition: Goal: Adequate nutrition will be maintained 10/30/2023 1046 by Cleotilde Bernice FALCON, RN Outcome: Adequate for Discharge 10/30/2023 1046 by Cleotilde Bernice FALCON, RN Outcome: Progressing   Problem:  Coping: Goal: Level of anxiety will decrease 10/30/2023 1046 by Cleotilde Bernice FALCON, RN Outcome: Adequate for Discharge 10/30/2023 1046 by Cleotilde Bernice FALCON, RN Outcome: Progressing   Problem: Elimination: Goal: Will not experience complications related to bowel motility 10/30/2023 1046 by Cleotilde Bernice FALCON, RN Outcome: Adequate for Discharge 10/30/2023 1046 by Cleotilde Bernice FALCON, RN Outcome: Progressing Goal: Will not experience complications related to urinary retention 10/30/2023 1046 by Cleotilde Bernice FALCON, RN Outcome: Adequate for Discharge 10/30/2023 1046 by Cleotilde Bernice FALCON, RN Outcome: Progressing   Problem: Pain Managment: Goal: General experience of comfort will improve and/or be controlled 10/30/2023 1046 by Cleotilde Bernice FALCON, RN Outcome: Adequate for Discharge 10/30/2023 1046 by Cleotilde Bernice FALCON, RN Outcome: Progressing   Problem: Safety: Goal: Ability to remain free from injury will improve 10/30/2023 1046 by Cleotilde Bernice FALCON, RN Outcome: Adequate for Discharge 10/30/2023 1046 by Cleotilde Bernice FALCON, RN Outcome: Progressing   Problem: Skin Integrity: Goal: Risk for impaired skin integrity will decrease 10/30/2023 1046 by Cleotilde Bernice FALCON, RN Outcome: Adequate for Discharge 10/30/2023 1046 by Cleotilde Bernice FALCON, RN Outcome: Progressing   Problem: Education: Goal: Knowledge of disease or condition will improve 10/30/2023 1046 by Cleotilde Bernice FALCON, RN Outcome: Adequate for Discharge 10/30/2023 1046 by Cleotilde Bernice FALCON, RN Outcome: Progressing Goal: Understanding of medication regimen will improve 10/30/2023 1046 by Cleotilde Bernice FALCON, RN Outcome: Adequate for Discharge 10/30/2023 1046 by Cleotilde Bernice FALCON, RN Outcome: Progressing Goal: Individualized Educational Video(s) 10/30/2023 1046 by Cleotilde Bernice FALCON, RN Outcome: Adequate for Discharge 10/30/2023 1046 by Cleotilde Bernice FALCON, RN Outcome: Progressing   Problem: Activity: Goal: Ability to tolerate increased activity will improve 10/30/2023 1046 by Cleotilde Bernice FALCON,  RN Outcome: Adequate for Discharge 10/30/2023 1046 by Cleotilde Bernice FALCON, RN Outcome: Progressing   Problem: Cardiac: Goal: Ability to achieve and maintain adequate cardiopulmonary perfusion will improve  10/30/2023 1046 by Cleotilde Bernice FALCON, RN Outcome: Adequate for Discharge 10/30/2023 1046 by Cleotilde Bernice FALCON, RN Outcome: Progressing   Problem: Health Behavior/Discharge Planning: Goal: Ability to safely manage health-related needs after discharge will improve 10/30/2023 1046 by Cleotilde Bernice FALCON, RN Outcome: Adequate for Discharge 10/30/2023 1046 by Cleotilde Bernice FALCON, RN Outcome: Progressing   Problem: Education: Goal: Knowledge of cardiac device and self-care will improve 10/30/2023 1046 by Cleotilde Bernice FALCON, RN Outcome: Adequate for Discharge 10/30/2023 1046 by Cleotilde Bernice FALCON, RN Outcome: Progressing Goal: Ability to safely manage health related needs after discharge will improve 10/30/2023 1046 by Cleotilde Bernice FALCON, RN Outcome: Adequate for Discharge 10/30/2023 1046 by Cleotilde Bernice FALCON, RN Outcome: Progressing Goal: Individualized Educational Video(s) 10/30/2023 1046 by Cleotilde Bernice FALCON, RN Outcome: Adequate for Discharge 10/30/2023 1046 by Cleotilde Bernice FALCON, RN Outcome: Progressing   Problem: Cardiac: Goal: Ability to achieve and maintain adequate cardiopulmonary perfusion will improve 10/30/2023 1046 by Cleotilde Bernice FALCON, RN Outcome: Adequate for Discharge 10/30/2023 1046 by Cleotilde Bernice FALCON, RN Outcome: Progressing

## 2023-10-30 NOTE — Plan of Care (Signed)
  Problem: Education: Goal: Knowledge of General Education information will improve Description: Including pain rating scale, medication(s)/side effects and non-pharmacologic comfort measures Outcome: Progressing   Problem: Health Behavior/Discharge Planning: Goal: Ability to manage health-related needs will improve Outcome: Progressing   Problem: Clinical Measurements: Goal: Ability to maintain clinical measurements within normal limits will improve Outcome: Progressing Goal: Will remain free from infection Outcome: Progressing Goal: Diagnostic test results will improve Outcome: Progressing Goal: Respiratory complications will improve Outcome: Progressing Goal: Cardiovascular complication will be avoided Outcome: Progressing   Problem: Activity: Goal: Risk for activity intolerance will decrease Outcome: Progressing   Problem: Nutrition: Goal: Adequate nutrition will be maintained Outcome: Progressing   Problem: Coping: Goal: Level of anxiety will decrease Outcome: Progressing   Problem: Elimination: Goal: Will not experience complications related to bowel motility Outcome: Progressing Goal: Will not experience complications related to urinary retention Outcome: Progressing   Problem: Pain Managment: Goal: General experience of comfort will improve and/or be controlled Outcome: Progressing   Problem: Safety: Goal: Ability to remain free from injury will improve Outcome: Progressing   Problem: Skin Integrity: Goal: Risk for impaired skin integrity will decrease Outcome: Progressing   Problem: Education: Goal: Knowledge of disease or condition will improve Outcome: Progressing Goal: Understanding of medication regimen will improve Outcome: Progressing Goal: Individualized Educational Video(s) Outcome: Progressing   Problem: Activity: Goal: Ability to tolerate increased activity will improve Outcome: Progressing   Problem: Cardiac: Goal: Ability to achieve  and maintain adequate cardiopulmonary perfusion will improve Outcome: Progressing   Problem: Health Behavior/Discharge Planning: Goal: Ability to safely manage health-related needs after discharge will improve Outcome: Progressing   Problem: Education: Goal: Knowledge of cardiac device and self-care will improve Outcome: Progressing Goal: Ability to safely manage health related needs after discharge will improve Outcome: Progressing Goal: Individualized Educational Video(s) Outcome: Progressing   Problem: Cardiac: Goal: Ability to achieve and maintain adequate cardiopulmonary perfusion will improve Outcome: Progressing

## 2023-10-31 ENCOUNTER — Other Ambulatory Visit: Payer: Self-pay

## 2023-10-31 ENCOUNTER — Encounter (HOSPITAL_COMMUNITY): Payer: Self-pay | Admitting: Internal Medicine

## 2023-11-01 ENCOUNTER — Telehealth: Payer: Self-pay

## 2023-11-01 ENCOUNTER — Other Ambulatory Visit: Payer: Self-pay

## 2023-11-01 ENCOUNTER — Telehealth: Payer: Self-pay | Admitting: Cardiology

## 2023-11-01 MED ORDER — COLCHICINE 0.6 MG PO TABS
0.6000 mg | ORAL_TABLET | Freq: Two times a day (BID) | ORAL | 0 refills | Status: DC
  Filled 2023-11-01: qty 16, 8d supply, fill #0

## 2023-11-01 MED ORDER — LOSARTAN POTASSIUM 100 MG PO TABS
100.0000 mg | ORAL_TABLET | Freq: Every day | ORAL | 3 refills | Status: AC
  Filled 2023-11-01 – 2023-12-26 (×3): qty 100, 100d supply, fill #0

## 2023-11-01 MED FILL — Midazolam HCl Inj 2 MG/2ML (Base Equivalent): INTRAMUSCULAR | Qty: 6.5 | Status: AC

## 2023-11-01 NOTE — Addendum Note (Signed)
 Addended by: CLAUDENE NEST A on: 11/01/2023 02:25 PM   Modules accepted: Orders

## 2023-11-01 NOTE — Telephone Encounter (Signed)
 Randall -- thanks for you help with this!!

## 2023-11-01 NOTE — Telephone Encounter (Signed)
 Dominique Griffith called the emergency overnight line regarding worsening chest pain with a deep breath and with lying flat. She rates the pain as a 4 out of 10 when sitting up and a 9 out of 10 with lying down. She also has a sore throat. She denies any shortness of breath. I am concerned about pericarditis after her recent PPM implantation. I have instructed her to take ibuprofen  400mg  every 8 hours and to take the ibuprofen  with a large glass of water  and with some food. She has normal kidneys and can tolerate a short course of NSAIDs. We will send her a prescription of colchicine  in the morning. She denies any shortness of breath so I have low suspicion for pneumothorax. I have instructed her to call the EP clinic in the morning for further instructions. Strict return ED precautions were given for worsening chest pain, lightheadedness, shortness of breath, or any other concerning symptom.

## 2023-11-01 NOTE — Telephone Encounter (Signed)
 See other note

## 2023-11-01 NOTE — Telephone Encounter (Addendum)
 Outreach made to Pt.  Pt states she is still having discomfort even with the ibuprofen .  Advised per Dr. Justus colchicine  0.6 mg PO BID.    After order entry-colchicine  contraindicated with propafenone .  Discussed with pharmacist and Dr. Inocencio.  Will have Pt HOLD propafenone  while taking short course of colchicine .  Pt will start colchicine  tomorrow AM.    If discomfort increases she is aware to contact device clinic.  Will plan on calling Pt this Thursday to ensure pain is decreasing.  If pain is not will need to discuss possible lead revision.  Pt aware of plan.  Will continue to monitor.

## 2023-11-01 NOTE — Telephone Encounter (Signed)
 Patient was calling to speak to someone in device. Please advise

## 2023-11-04 ENCOUNTER — Encounter

## 2023-11-04 NOTE — Telephone Encounter (Signed)
Attempted to contact Pt.  Left message requesting call back.

## 2023-11-04 NOTE — Telephone Encounter (Signed)
 Call back received from Pt.  Per Pt she states the pain she had improved before she even started the colchicine  on 11/01/2023.  She is wondering if she needs to keep taking it?  Advised would discuss with provider and call her back.  She mentions she does not want to restart the propafenone .  Will follow up.

## 2023-11-04 NOTE — Telephone Encounter (Signed)
 Returned call to Pt.  Advised per EP APP ok to stop colchicine .  She will monitor for recurrent chest pain and contact office.  Low threshold to consider micro perf if chest pain returns.  She is also going to hold her propafenone  until her upcoming wound check.

## 2023-11-08 DIAGNOSIS — H179 Unspecified corneal scar and opacity: Secondary | ICD-10-CM | POA: Diagnosis not present

## 2023-11-08 DIAGNOSIS — B394 Histoplasmosis capsulati, unspecified: Secondary | ICD-10-CM | POA: Diagnosis not present

## 2023-11-08 DIAGNOSIS — H43811 Vitreous degeneration, right eye: Secondary | ICD-10-CM | POA: Diagnosis not present

## 2023-11-08 DIAGNOSIS — Z961 Presence of intraocular lens: Secondary | ICD-10-CM | POA: Diagnosis not present

## 2023-11-08 DIAGNOSIS — H04123 Dry eye syndrome of bilateral lacrimal glands: Secondary | ICD-10-CM | POA: Diagnosis not present

## 2023-11-09 ENCOUNTER — Ambulatory Visit: Attending: Cardiovascular Disease

## 2023-11-09 DIAGNOSIS — I495 Sick sinus syndrome: Secondary | ICD-10-CM

## 2023-11-09 LAB — CUP PACEART INCLINIC DEVICE CHECK
Battery Remaining Longevity: 142 mo
Battery Voltage: 3.22 V
Brady Statistic AP VP Percent: 0.06 %
Brady Statistic AP VS Percent: 97.52 %
Brady Statistic AS VP Percent: 0.05 %
Brady Statistic AS VS Percent: 2.37 %
Brady Statistic RA Percent Paced: 97.34 %
Brady Statistic RV Percent Paced: 0.14 %
Date Time Interrogation Session: 20250916131845
Implantable Lead Connection Status: 753985
Implantable Lead Connection Status: 753985
Implantable Lead Implant Date: 20250905
Implantable Lead Implant Date: 20250905
Implantable Lead Location: 753859
Implantable Lead Location: 753860
Implantable Lead Model: 3830
Implantable Lead Model: 5076
Implantable Pulse Generator Implant Date: 20250905
Lead Channel Impedance Value: 304 Ohm
Lead Channel Impedance Value: 380 Ohm
Lead Channel Impedance Value: 418 Ohm
Lead Channel Impedance Value: 570 Ohm
Lead Channel Pacing Threshold Amplitude: 0.625 V
Lead Channel Pacing Threshold Amplitude: 0.75 V
Lead Channel Pacing Threshold Pulse Width: 0.4 ms
Lead Channel Pacing Threshold Pulse Width: 0.4 ms
Lead Channel Sensing Intrinsic Amplitude: 1.625 mV
Lead Channel Sensing Intrinsic Amplitude: 11.75 mV
Lead Channel Sensing Intrinsic Amplitude: 11.875 mV
Lead Channel Sensing Intrinsic Amplitude: 2.375 mV
Lead Channel Setting Pacing Amplitude: 3.5 V
Lead Channel Setting Pacing Amplitude: 3.5 V
Lead Channel Setting Pacing Pulse Width: 0.4 ms
Lead Channel Setting Sensing Sensitivity: 1.2 mV
Zone Setting Status: 755011

## 2023-11-09 NOTE — Progress Notes (Signed)
 Normal dual chamber pacemaker wound check. Presenting rhythm: AP/VS 62 . Wound well healed. Routine testing performed. Thresholds, sensing, and impedance consistent with implant measurements and at 3.5V safety margin/auto capture until 3 month visit. AT/AF burden 0.2%. Reviewed arm restrictions to continue for 6 weeks total post op.  Pt enrolled in remote follow-up.

## 2023-11-09 NOTE — Patient Instructions (Signed)

## 2023-11-10 ENCOUNTER — Other Ambulatory Visit: Payer: Self-pay

## 2023-11-10 MED ORDER — MIRABEGRON ER 50 MG PO TB24
50.0000 mg | ORAL_TABLET | Freq: Every day | ORAL | 6 refills | Status: AC
  Filled 2023-11-29: qty 30, 30d supply, fill #0
  Filled 2023-12-26: qty 30, 30d supply, fill #1
  Filled 2024-01-25: qty 30, 30d supply, fill #2
  Filled 2024-03-07: qty 30, 30d supply, fill #3

## 2023-11-12 ENCOUNTER — Other Ambulatory Visit: Payer: Self-pay

## 2023-11-12 MED ORDER — FLUZONE HIGH-DOSE 0.5 ML IM SUSY
0.5000 mL | PREFILLED_SYRINGE | Freq: Once | INTRAMUSCULAR | 0 refills | Status: AC
  Filled 2023-11-12: qty 0.5, 1d supply, fill #0

## 2023-11-12 MED ORDER — COVID-19 MRNA VAC-TRIS(PFIZER) 30 MCG/0.3ML IM SUSY
0.3000 mL | PREFILLED_SYRINGE | Freq: Once | INTRAMUSCULAR | 0 refills | Status: AC
  Filled 2023-11-12: qty 0.3, 1d supply, fill #0

## 2023-11-15 ENCOUNTER — Ambulatory Visit: Payer: Self-pay | Admitting: Cardiology

## 2023-11-16 ENCOUNTER — Other Ambulatory Visit (HOSPITAL_BASED_OUTPATIENT_CLINIC_OR_DEPARTMENT_OTHER): Payer: Self-pay | Admitting: Internal Medicine

## 2023-11-16 DIAGNOSIS — D6869 Other thrombophilia: Secondary | ICD-10-CM | POA: Diagnosis not present

## 2023-11-16 DIAGNOSIS — N83209 Unspecified ovarian cyst, unspecified side: Secondary | ICD-10-CM | POA: Diagnosis not present

## 2023-11-16 DIAGNOSIS — E663 Overweight: Secondary | ICD-10-CM | POA: Diagnosis not present

## 2023-11-16 DIAGNOSIS — Z23 Encounter for immunization: Secondary | ICD-10-CM | POA: Diagnosis not present

## 2023-11-16 DIAGNOSIS — F322 Major depressive disorder, single episode, severe without psychotic features: Secondary | ICD-10-CM | POA: Diagnosis not present

## 2023-11-16 DIAGNOSIS — I4892 Unspecified atrial flutter: Secondary | ICD-10-CM | POA: Diagnosis not present

## 2023-11-16 DIAGNOSIS — M545 Low back pain, unspecified: Secondary | ICD-10-CM | POA: Diagnosis not present

## 2023-11-16 DIAGNOSIS — I700 Atherosclerosis of aorta: Secondary | ICD-10-CM | POA: Diagnosis not present

## 2023-11-16 DIAGNOSIS — M8580 Other specified disorders of bone density and structure, unspecified site: Secondary | ICD-10-CM | POA: Diagnosis not present

## 2023-11-16 DIAGNOSIS — Z0000 Encounter for general adult medical examination without abnormal findings: Secondary | ICD-10-CM | POA: Diagnosis not present

## 2023-11-16 DIAGNOSIS — K219 Gastro-esophageal reflux disease without esophagitis: Secondary | ICD-10-CM | POA: Diagnosis not present

## 2023-11-16 DIAGNOSIS — I10 Essential (primary) hypertension: Secondary | ICD-10-CM | POA: Diagnosis not present

## 2023-11-19 NOTE — Progress Notes (Signed)
 Remote Loop Recorder Transmission

## 2023-11-29 ENCOUNTER — Other Ambulatory Visit: Payer: Self-pay

## 2023-11-30 ENCOUNTER — Other Ambulatory Visit: Payer: Self-pay

## 2023-11-30 DIAGNOSIS — L821 Other seborrheic keratosis: Secondary | ICD-10-CM | POA: Diagnosis not present

## 2023-11-30 DIAGNOSIS — L309 Dermatitis, unspecified: Secondary | ICD-10-CM | POA: Diagnosis not present

## 2023-11-30 DIAGNOSIS — Z411 Encounter for cosmetic surgery: Secondary | ICD-10-CM | POA: Diagnosis not present

## 2023-11-30 DIAGNOSIS — L905 Scar conditions and fibrosis of skin: Secondary | ICD-10-CM | POA: Diagnosis not present

## 2023-11-30 DIAGNOSIS — L853 Xerosis cutis: Secondary | ICD-10-CM | POA: Diagnosis not present

## 2023-11-30 DIAGNOSIS — R61 Generalized hyperhidrosis: Secondary | ICD-10-CM | POA: Diagnosis not present

## 2023-12-02 NOTE — Progress Notes (Signed)
 Remote Loop Recorder Transmission

## 2023-12-03 ENCOUNTER — Other Ambulatory Visit: Payer: Self-pay

## 2023-12-06 ENCOUNTER — Encounter

## 2023-12-06 ENCOUNTER — Other Ambulatory Visit: Payer: Self-pay

## 2023-12-06 MED ORDER — TRETINOIN 0.025 % EX CREA
TOPICAL_CREAM | Freq: Every evening | CUTANEOUS | 2 refills | Status: DC
  Filled 2023-12-06 – 2024-01-25 (×2): qty 20, 30d supply, fill #0

## 2023-12-07 ENCOUNTER — Other Ambulatory Visit: Payer: Self-pay

## 2023-12-08 ENCOUNTER — Other Ambulatory Visit: Payer: Self-pay

## 2023-12-13 ENCOUNTER — Encounter

## 2023-12-15 DIAGNOSIS — M9903 Segmental and somatic dysfunction of lumbar region: Secondary | ICD-10-CM | POA: Diagnosis not present

## 2023-12-15 DIAGNOSIS — M9905 Segmental and somatic dysfunction of pelvic region: Secondary | ICD-10-CM | POA: Diagnosis not present

## 2023-12-15 DIAGNOSIS — M5136 Other intervertebral disc degeneration, lumbar region with discogenic back pain only: Secondary | ICD-10-CM | POA: Diagnosis not present

## 2023-12-15 DIAGNOSIS — M9904 Segmental and somatic dysfunction of sacral region: Secondary | ICD-10-CM | POA: Diagnosis not present

## 2023-12-16 ENCOUNTER — Other Ambulatory Visit: Payer: Self-pay

## 2023-12-16 DIAGNOSIS — M9905 Segmental and somatic dysfunction of pelvic region: Secondary | ICD-10-CM | POA: Diagnosis not present

## 2023-12-16 DIAGNOSIS — M9903 Segmental and somatic dysfunction of lumbar region: Secondary | ICD-10-CM | POA: Diagnosis not present

## 2023-12-16 DIAGNOSIS — M9904 Segmental and somatic dysfunction of sacral region: Secondary | ICD-10-CM | POA: Diagnosis not present

## 2023-12-16 DIAGNOSIS — M5136 Other intervertebral disc degeneration, lumbar region with discogenic back pain only: Secondary | ICD-10-CM | POA: Diagnosis not present

## 2023-12-17 ENCOUNTER — Other Ambulatory Visit: Payer: Self-pay

## 2023-12-20 ENCOUNTER — Other Ambulatory Visit: Payer: Self-pay

## 2023-12-20 DIAGNOSIS — M9903 Segmental and somatic dysfunction of lumbar region: Secondary | ICD-10-CM | POA: Diagnosis not present

## 2023-12-20 DIAGNOSIS — M5136 Other intervertebral disc degeneration, lumbar region with discogenic back pain only: Secondary | ICD-10-CM | POA: Diagnosis not present

## 2023-12-20 DIAGNOSIS — M9905 Segmental and somatic dysfunction of pelvic region: Secondary | ICD-10-CM | POA: Diagnosis not present

## 2023-12-20 DIAGNOSIS — M9904 Segmental and somatic dysfunction of sacral region: Secondary | ICD-10-CM | POA: Diagnosis not present

## 2023-12-23 DIAGNOSIS — M9904 Segmental and somatic dysfunction of sacral region: Secondary | ICD-10-CM | POA: Diagnosis not present

## 2023-12-23 DIAGNOSIS — M5136 Other intervertebral disc degeneration, lumbar region with discogenic back pain only: Secondary | ICD-10-CM | POA: Diagnosis not present

## 2023-12-23 DIAGNOSIS — M9903 Segmental and somatic dysfunction of lumbar region: Secondary | ICD-10-CM | POA: Diagnosis not present

## 2023-12-23 DIAGNOSIS — M9905 Segmental and somatic dysfunction of pelvic region: Secondary | ICD-10-CM | POA: Diagnosis not present

## 2023-12-27 ENCOUNTER — Other Ambulatory Visit: Payer: Self-pay

## 2023-12-28 ENCOUNTER — Other Ambulatory Visit: Payer: Self-pay

## 2023-12-29 DIAGNOSIS — M9904 Segmental and somatic dysfunction of sacral region: Secondary | ICD-10-CM | POA: Diagnosis not present

## 2023-12-29 DIAGNOSIS — M5136 Other intervertebral disc degeneration, lumbar region with discogenic back pain only: Secondary | ICD-10-CM | POA: Diagnosis not present

## 2023-12-29 DIAGNOSIS — M9905 Segmental and somatic dysfunction of pelvic region: Secondary | ICD-10-CM | POA: Diagnosis not present

## 2023-12-29 DIAGNOSIS — M9903 Segmental and somatic dysfunction of lumbar region: Secondary | ICD-10-CM | POA: Diagnosis not present

## 2024-01-03 DIAGNOSIS — N83202 Unspecified ovarian cyst, left side: Secondary | ICD-10-CM | POA: Diagnosis not present

## 2024-01-03 DIAGNOSIS — R159 Full incontinence of feces: Secondary | ICD-10-CM | POA: Diagnosis not present

## 2024-01-03 DIAGNOSIS — Z01419 Encounter for gynecological examination (general) (routine) without abnormal findings: Secondary | ICD-10-CM | POA: Diagnosis not present

## 2024-01-03 DIAGNOSIS — Z01411 Encounter for gynecological examination (general) (routine) with abnormal findings: Secondary | ICD-10-CM | POA: Diagnosis not present

## 2024-01-03 DIAGNOSIS — Z1231 Encounter for screening mammogram for malignant neoplasm of breast: Secondary | ICD-10-CM | POA: Diagnosis not present

## 2024-01-03 DIAGNOSIS — N952 Postmenopausal atrophic vaginitis: Secondary | ICD-10-CM | POA: Diagnosis not present

## 2024-01-03 DIAGNOSIS — N3941 Urge incontinence: Secondary | ICD-10-CM | POA: Diagnosis not present

## 2024-01-04 DIAGNOSIS — M5136 Other intervertebral disc degeneration, lumbar region with discogenic back pain only: Secondary | ICD-10-CM | POA: Diagnosis not present

## 2024-01-04 DIAGNOSIS — M9903 Segmental and somatic dysfunction of lumbar region: Secondary | ICD-10-CM | POA: Diagnosis not present

## 2024-01-04 DIAGNOSIS — M9904 Segmental and somatic dysfunction of sacral region: Secondary | ICD-10-CM | POA: Diagnosis not present

## 2024-01-04 DIAGNOSIS — M9905 Segmental and somatic dysfunction of pelvic region: Secondary | ICD-10-CM | POA: Diagnosis not present

## 2024-01-06 ENCOUNTER — Encounter

## 2024-01-10 ENCOUNTER — Other Ambulatory Visit: Payer: Self-pay

## 2024-01-10 DIAGNOSIS — M9904 Segmental and somatic dysfunction of sacral region: Secondary | ICD-10-CM | POA: Diagnosis not present

## 2024-01-10 DIAGNOSIS — M9903 Segmental and somatic dysfunction of lumbar region: Secondary | ICD-10-CM | POA: Diagnosis not present

## 2024-01-10 DIAGNOSIS — M9905 Segmental and somatic dysfunction of pelvic region: Secondary | ICD-10-CM | POA: Diagnosis not present

## 2024-01-10 DIAGNOSIS — M5136 Other intervertebral disc degeneration, lumbar region with discogenic back pain only: Secondary | ICD-10-CM | POA: Diagnosis not present

## 2024-01-12 DIAGNOSIS — M9903 Segmental and somatic dysfunction of lumbar region: Secondary | ICD-10-CM | POA: Diagnosis not present

## 2024-01-12 DIAGNOSIS — M9904 Segmental and somatic dysfunction of sacral region: Secondary | ICD-10-CM | POA: Diagnosis not present

## 2024-01-12 DIAGNOSIS — M5136 Other intervertebral disc degeneration, lumbar region with discogenic back pain only: Secondary | ICD-10-CM | POA: Diagnosis not present

## 2024-01-12 DIAGNOSIS — M9905 Segmental and somatic dysfunction of pelvic region: Secondary | ICD-10-CM | POA: Diagnosis not present

## 2024-01-18 DIAGNOSIS — M5136 Other intervertebral disc degeneration, lumbar region with discogenic back pain only: Secondary | ICD-10-CM | POA: Diagnosis not present

## 2024-01-18 DIAGNOSIS — M9903 Segmental and somatic dysfunction of lumbar region: Secondary | ICD-10-CM | POA: Diagnosis not present

## 2024-01-18 DIAGNOSIS — M9905 Segmental and somatic dysfunction of pelvic region: Secondary | ICD-10-CM | POA: Diagnosis not present

## 2024-01-18 DIAGNOSIS — M9904 Segmental and somatic dysfunction of sacral region: Secondary | ICD-10-CM | POA: Diagnosis not present

## 2024-01-24 DIAGNOSIS — M9903 Segmental and somatic dysfunction of lumbar region: Secondary | ICD-10-CM | POA: Diagnosis not present

## 2024-01-24 DIAGNOSIS — M5136 Other intervertebral disc degeneration, lumbar region with discogenic back pain only: Secondary | ICD-10-CM | POA: Diagnosis not present

## 2024-01-24 DIAGNOSIS — M9904 Segmental and somatic dysfunction of sacral region: Secondary | ICD-10-CM | POA: Diagnosis not present

## 2024-01-24 DIAGNOSIS — M9905 Segmental and somatic dysfunction of pelvic region: Secondary | ICD-10-CM | POA: Diagnosis not present

## 2024-01-25 ENCOUNTER — Other Ambulatory Visit: Payer: Self-pay

## 2024-01-25 ENCOUNTER — Encounter: Payer: Self-pay | Admitting: Cardiology

## 2024-01-25 MED ORDER — BACLOFEN 10 MG PO TABS
10.0000 mg | ORAL_TABLET | Freq: Three times a day (TID) | ORAL | 3 refills | Status: AC
  Filled 2024-01-25: qty 270, 90d supply, fill #0

## 2024-01-25 MED ORDER — MIRABEGRON ER 25 MG PO TB24
25.0000 mg | ORAL_TABLET | Freq: Every day | ORAL | 4 refills | Status: AC
  Filled 2024-01-25 – 2024-01-26 (×2): qty 90, 90d supply, fill #0

## 2024-01-25 MED ORDER — SERTRALINE HCL 100 MG PO TABS
100.0000 mg | ORAL_TABLET | Freq: Every day | ORAL | 3 refills | Status: AC
  Filled 2024-02-15: qty 90, 90d supply, fill #0

## 2024-01-26 ENCOUNTER — Other Ambulatory Visit: Payer: Self-pay

## 2024-01-28 ENCOUNTER — Encounter

## 2024-01-28 ENCOUNTER — Other Ambulatory Visit: Payer: Self-pay

## 2024-01-28 ENCOUNTER — Ambulatory Visit: Attending: Cardiology

## 2024-01-28 DIAGNOSIS — I480 Paroxysmal atrial fibrillation: Secondary | ICD-10-CM

## 2024-01-30 LAB — CUP PACEART REMOTE DEVICE CHECK
Battery Remaining Longevity: 151 mo
Battery Voltage: 3.2 V
Brady Statistic AP VP Percent: 0.01 %
Brady Statistic AP VS Percent: 24.89 %
Brady Statistic AS VP Percent: 0.03 %
Brady Statistic AS VS Percent: 75.08 %
Brady Statistic RA Percent Paced: 24.63 %
Brady Statistic RV Percent Paced: 0.05 %
Date Time Interrogation Session: 20251205134345
Implantable Lead Connection Status: 753985
Implantable Lead Connection Status: 753985
Implantable Lead Implant Date: 20250905
Implantable Lead Implant Date: 20250905
Implantable Lead Location: 753859
Implantable Lead Location: 753860
Implantable Lead Model: 3830
Implantable Lead Model: 5076
Implantable Pulse Generator Implant Date: 20250905
Lead Channel Impedance Value: 304 Ohm
Lead Channel Impedance Value: 361 Ohm
Lead Channel Impedance Value: 361 Ohm
Lead Channel Impedance Value: 513 Ohm
Lead Channel Pacing Threshold Amplitude: 0.75 V
Lead Channel Pacing Threshold Amplitude: 1 V
Lead Channel Pacing Threshold Pulse Width: 0.4 ms
Lead Channel Pacing Threshold Pulse Width: 0.4 ms
Lead Channel Sensing Intrinsic Amplitude: 12 mV
Lead Channel Sensing Intrinsic Amplitude: 12 mV
Lead Channel Sensing Intrinsic Amplitude: 2 mV
Lead Channel Sensing Intrinsic Amplitude: 2 mV
Lead Channel Setting Pacing Amplitude: 3.5 V
Lead Channel Setting Pacing Amplitude: 3.5 V
Lead Channel Setting Pacing Pulse Width: 0.4 ms
Lead Channel Setting Sensing Sensitivity: 1.2 mV
Zone Setting Status: 755011

## 2024-02-01 DIAGNOSIS — M9904 Segmental and somatic dysfunction of sacral region: Secondary | ICD-10-CM | POA: Diagnosis not present

## 2024-02-01 DIAGNOSIS — M5136 Other intervertebral disc degeneration, lumbar region with discogenic back pain only: Secondary | ICD-10-CM | POA: Diagnosis not present

## 2024-02-01 DIAGNOSIS — M9905 Segmental and somatic dysfunction of pelvic region: Secondary | ICD-10-CM | POA: Diagnosis not present

## 2024-02-01 DIAGNOSIS — M9903 Segmental and somatic dysfunction of lumbar region: Secondary | ICD-10-CM | POA: Diagnosis not present

## 2024-02-01 NOTE — Progress Notes (Signed)
 Remote PPM Transmission

## 2024-02-02 ENCOUNTER — Ambulatory Visit: Payer: Self-pay | Admitting: Cardiology

## 2024-02-02 ENCOUNTER — Ambulatory Visit: Attending: Cardiology | Admitting: Cardiology

## 2024-02-02 ENCOUNTER — Encounter: Payer: Self-pay | Admitting: Cardiology

## 2024-02-02 VITALS — BP 120/78 | HR 92 | Ht 63.0 in | Wt 173.0 lb

## 2024-02-02 DIAGNOSIS — I483 Typical atrial flutter: Secondary | ICD-10-CM

## 2024-02-02 DIAGNOSIS — I480 Paroxysmal atrial fibrillation: Secondary | ICD-10-CM | POA: Diagnosis not present

## 2024-02-02 DIAGNOSIS — I495 Sick sinus syndrome: Secondary | ICD-10-CM

## 2024-02-02 DIAGNOSIS — D6869 Other thrombophilia: Secondary | ICD-10-CM | POA: Diagnosis not present

## 2024-02-02 LAB — CUP PACEART INCLINIC DEVICE CHECK
Date Time Interrogation Session: 20251210161544
Implantable Lead Connection Status: 753985
Implantable Lead Connection Status: 753985
Implantable Lead Implant Date: 20250905
Implantable Lead Implant Date: 20250905
Implantable Lead Location: 753859
Implantable Lead Location: 753860
Implantable Lead Model: 3830
Implantable Lead Model: 5076
Implantable Pulse Generator Implant Date: 20250905

## 2024-02-02 MED ORDER — PROPAFENONE HCL ER 225 MG PO CP12: 225.0000 mg | ORAL_CAPSULE | Freq: Two times a day (BID) | ORAL | 6 refills | Status: DC

## 2024-02-02 NOTE — Patient Instructions (Signed)
 Medication Instructions:  Your physician has recommended you make the following change in your medication: START Propafenone  225 twice daily (please let us  know when you start this.  *If you need a refill on your cardiac medications before your next appointment, please call your pharmacy*  Lab Work: None ordered  If you have any lab test that is abnormal or we need to change your treatment, we will call you to review the results.  Testing/Procedures: None ordered  Follow-Up: At [facility], you and your health needs are our priority.  As part of our continuing mission to provide you with exceptional heart care, our providers are all part of one team.  This team includes your primary Cardiologist (physician) and Advanced Practice Providers or APPs (Physician Assistants and Nurse Practitioners) who all work together to provide you with the care you need, when you need it.  Your next appointment:   To be determined  Provider:   Soyla Norton, MD     Thank you for choosing [facility]!!   Maeola Domino, RN [phone]   Other Instructions  Propafenone  Tablets What is this medication? PROPAFENONE  (proe pa FEEN one) prevents and treats a fast or irregular heartbeat (arrhythmia). It is often used to treat a type of arrhythmia known as AFib (atrial fibrillation). It works by slowing down overactive electric signals in the heart, which stabilizes your heart rhythm. It belongs to a group of medications called antiarrhythmics. This medicine may be used for other purposes; ask your health care provider or pharmacist if you have questions. COMMON BRAND NAME(S): Rythmol  What should I tell my care team before I take this medication? They need to know if you have any of these conditions: Brugada syndrome Have had a heart attack Heart failure High or low levels of electrolytes, such as magnesium, potassium, or sodium in your blood Kidney disease Liver disease Low blood  pressure Lung or breathing disease, such as asthma or COPD Pacemaker Slow heartbeat An unusual or allergic reaction to propafenone , other medications, foods, dyes, or preservatives Pregnant or trying to get pregnant Breastfeeding How should I use this medication? Take this medication by mouth with water . Take it as directed on the prescription label at the same time every day. You can take it with or without food. If it upsets your stomach, take it with food. Keep taking it unless your care team tells you to stop. Talk to your care team about the use of this medication in children. Special care may be needed. Overdosage: If you think you have taken too much of this medicine contact a poison control center or emergency room at once. NOTE: This medicine is only for you. Do not share this medicine with others. What if I miss a dose? If you miss a dose, take it as soon as you can. If it is almost time for your next dose, take only that dose. Do not take double or extra doses. What may interact with this medication? Do not take this medication with any of the following: Certain medications for fungal infections, such as fluconazole, ketoconazole, posaconazole Certain medications for irregular heartbeat, such as dronedarone Cisapride Idelalisib Nirmatrelvir; ritonavir Pimozide Saquinavir Thioridazine Tipranavir This medication may also interact with the following: Beta blockers, such as propranolol Digoxin Grapefruit juice Orlistat Warfarin Other medications may affect the way this medication works. Talk with your care team about all the medications you take. They may suggest changes to your treatment plan to lower the risk of side  effects and to make sure your medications work as intended. This list may not describe all possible interactions. Give your health care provider a list of all the medicines, herbs, non-prescription drugs, or dietary supplements you use. Also tell them if you  smoke, drink alcohol , or use illegal drugs. Some items may interact with your medicine. What should I watch for while using this medication? Your condition will be monitored closely when you first begin therapy. Often, this medication is first started in a hospital or other monitored health care setting. Once you are on maintenance therapy, visit your care team for regular checks on your progress. Because your condition and use of this medication carry some risk, it is a good idea to carry an identification card, necklace or bracelet with details of your condition, medications, and care team. This medication may affect your coordination, reaction time, or judgment. Do not drive or operate machinery until you know how this medication affects you. Sit up or stand slowly to reduce the risk of dizzy or fainting spells. Drinking alcohol  with this medication can increase the risk of these side effects. If you are going to have surgery, tell your care team that you are taking this medication. What side effects may I notice from receiving this medication? Side effects that you should report to your care team as soon as possible: Allergic reactions--skin rash, itching, hives, swelling of the face, lips, tongue, or throat Heart failure--shortness of breath, swelling of the ankles, feet, or hands, sudden weight gain, unusual weakness or fatigue Heart rhythm changes--fast or irregular heartbeat, dizziness, feeling faint or lightheaded, chest pain, trouble breathing Infection--fever, chills, cough, sore throat Unusual bruising or bleeding Side effects that usually do not require medical attention (report to your care team if they continue or are bothersome): Change in taste Constipation Dizziness Fatigue Nausea This list may not describe all possible side effects. Call your doctor for medical advice about side effects. You may report side effects to FDA at 1-800-FDA-1088. Where should I keep my  medication? Keep out of the reach of children and pets. Store at room temperature between 15 and 30 degrees C (59 and 86 degrees F). Protect from light. Keep container tightly closed. Throw away any unused medication after the expiration date. NOTE: This sheet is a summary. It may not cover all possible information. If you have questions about this medicine, talk to your doctor, pharmacist, or health care provider.  2024 Elsevier/Gold Standard (2022-08-13 00:00:00)

## 2024-02-02 NOTE — Progress Notes (Signed)
 Electrophysiology Office Note:   Date:  02/02/2024  ID:  Dominique Griffith, Dominique Griffith Dominique Griffith 09, 1942, MRN [id number]  Primary Cardiologist: Dominique Balding, MD Primary Heart Failure: None Electrophysiologist: Dominique Garoutte Gladis Norton, MD      History of Present Illness:   Dominique Griffith is a 83 y.o. female with h/o tachybradycardia syndrome, paroxysmal atrial fibrillation/flutter, hypertension seen today for routine electrophysiology followup.   Discussed the use of AI scribe software for clinical note transcription with the patient, who gave verbal consent to proceed.  History of Present Illness Dominique Griffith is an 83 year old female with a pacemaker who presents for routine follow-up.  She feels generally well but experienced a couple of atrial fibrillation episodes over a month ago. She was previously on propafenone , which was discontinued, and she is considering restarting it if necessary. She prefers to have a prescription available but plans to use it only if needed.  She inquires about the use of metoprolol , though she is not currently on it. She understands that propafenone  has some properties similar to metoprolol , which might provide the benefits of both medications.  She is not experiencing any issues with her pacemaker.  No current issues or symptoms related to her pacemaker, and she states she feels okay.  she denies chest pain, palpitations, dyspnea, PND, orthopnea, nausea, vomiting, dizziness, syncope, edema, weight gain, or early satiety.   Review of systems complete and found to be negative unless listed in HPI.      EP Information / Studies Reviewed:    EKG is ordered today. Personal review as below.  EKG Interpretation Date/Time:  Wednesday February 02 2024 14:58:57 EST Ventricular Rate:  90 PR Interval:  174 QRS Duration:  70 QT Interval:  366 QTC Calculation: 447 R Axis:   64  Text Interpretation: Normal sinus rhythm Low voltage QRS When compared with ECG of  30-Oct-2023 07:00, PR interval has decreased Vent. rate has increased BY  30 BPM Confirmed by Dominique Griffith ([id number]) on 02/02/2024 3:11:45 PM   PPM Interrogation-  reviewed in detail today,  See PACEART report.  Device History: Medtronic Dual Chamber PPM implanted 10/29/2023 for Tachy-Brady syndrome  Risk Assessment/Calculations:    CHA2DS2-VASc Score = 5   This indicates a 7.2% annual risk of stroke. The patient's score is based upon: CHF History: 0 HTN History: 1 Diabetes History: 0 Stroke History: 0 Vascular Disease History: 1 Age Score: 2 Gender Score: 1            Physical Exam:   VS:  BP 120/78 (BP Location: Right Arm, Patient Position: Sitting, Cuff Size: Normal)   Pulse 92   Ht 5' 3 (1.6 m)   Wt 173 lb (78.5 kg)   SpO2 95%   BMI 30.65 kg/m    Wt Readings from Last 3 Encounters:  02/02/24 173 lb (78.5 kg)  10/29/23 173 lb 4.5 oz (78.6 kg)  10/27/23 176 lb (79.8 kg)     GEN: Well nourished, well developed in no acute distress NECK: No JVD; No carotid bruits CARDIAC: Regular rate and rhythm, no murmurs, rubs, gallops RESPIRATORY:  Clear to auscultation without rales, wheezing or rhonchi  ABDOMEN: Soft, non-tender, non-distended EXTREMITIES:  No edema; No deformity   ASSESSMENT AND PLAN:    Tachy-Brady syndrome s/p Medtronic PPM  Normal PPM function See Pace Art report No changes today  2.  Paroxysmal atrial fibrillation/flutter: Post ablation 12/24/2022.  She is continue to have episodes of atrial fibrillation, though most of them are  short.  She notes that she has episodes with symptoms of fatigue and shortness of breath.  For now she is happy with her control.  Dominique Griffith start propafenone  225 mg twice daily.  She Dominique Griffith start it if she develops continued symptoms.  3.  Secondary hypercoagulable state: On Eliquis   4.  Hypertension: Well-controlled  Disposition:   Follow up with EP Team in 6 months  Signed, Dominique Griffith Gladis Norton, MD

## 2024-02-02 NOTE — Addendum Note (Signed)
 Addended by: GRETEL MAEOLA CROME on: 02/02/2024 03:32 PM   Modules accepted: Orders

## 2024-02-03 ENCOUNTER — Other Ambulatory Visit: Payer: Self-pay

## 2024-02-03 MED ORDER — ZEPBOUND 2.5 MG/0.5ML ~~LOC~~ SOAJ
2.5000 mg | SUBCUTANEOUS | 1 refills | Status: AC
  Filled 2024-02-03: qty 2, 28d supply, fill #0

## 2024-02-04 ENCOUNTER — Telehealth: Payer: Self-pay

## 2024-02-04 NOTE — Telephone Encounter (Signed)
 Alert received from [employer] for AF in progress 12/12, poor rate control, Eliquis  per EPIC.  Would like patient to send manual transmission to review updated presenting rhythm.   Attempted to contact patient. No answer, left message to call back.

## 2024-02-04 NOTE — Telephone Encounter (Signed)
 Pt was returning nurse call and is requesting a callback. Please advise.

## 2024-02-07 ENCOUNTER — Other Ambulatory Visit: Payer: Self-pay

## 2024-02-07 ENCOUNTER — Encounter

## 2024-02-07 MED ORDER — PROPAFENONE HCL ER 225 MG PO CP12: 225.0000 mg | ORAL_CAPSULE | Freq: Two times a day (BID) | ORAL | Status: AC | PRN

## 2024-02-07 NOTE — Telephone Encounter (Signed)
 Pt advised to restart Toprol  50 mg daily - pt agreeable. Aware Propafenone  is PRN.  Pt will let us  know if she needs this medication so we can determine tx plan. Updated this medication to PRN. Patient verbalized understanding and agreeable to plan.

## 2024-02-07 NOTE — Telephone Encounter (Signed)
 Attempted to return call to patient regarding recent device alert. Left voicemail requesting call back.   Will continue to monitor and update accordingly.

## 2024-02-07 NOTE — Telephone Encounter (Signed)
 Patient says she definitely can tell when she is in Afib.  She has fast heart rates, dizziness, palpitations and some SOB.  She has noted feeling that way over the past week.   Transmission received, patient not currently in AF but when she has poor V-RATE control. Also has had 1 short NSVT event.  5 AF/RVR episodes, longest duration 12 minutes.   She is unclear what she should be taking for her AF and rate control.  02/02/24 OV states she can start Propafenone  225mg  bid (but let us  know when she does) and phone messages on 01/28/24 state that she should take her Metoprolol  Succinate 50mg  daily for AF as well.   She has started neither and needs direction.  Is she to take both? Or since she has never taken the Metoprolol , should she just start that first?   Will defer to Dr. Inocencio.

## 2024-02-07 NOTE — Telephone Encounter (Signed)
Pt called back wanting a return call

## 2024-02-08 ENCOUNTER — Other Ambulatory Visit: Payer: Self-pay

## 2024-02-08 MED ORDER — PROPAFENONE HCL ER 225 MG PO CP12
225.0000 mg | ORAL_CAPSULE | Freq: Two times a day (BID) | ORAL | 6 refills | Status: AC
  Filled 2024-02-08: qty 60, 30d supply, fill #0

## 2024-02-10 ENCOUNTER — Other Ambulatory Visit: Payer: Self-pay

## 2024-02-11 ENCOUNTER — Other Ambulatory Visit: Payer: Self-pay

## 2024-02-11 MED ORDER — DOXYCYCLINE HYCLATE 100 MG PO TABS
100.0000 mg | ORAL_TABLET | Freq: Two times a day (BID) | ORAL | 0 refills | Status: AC
  Filled 2024-02-11: qty 20, 10d supply, fill #0

## 2024-02-15 ENCOUNTER — Other Ambulatory Visit: Payer: Self-pay

## 2024-02-16 ENCOUNTER — Other Ambulatory Visit: Payer: Self-pay

## 2024-02-16 MED ORDER — PREDNISONE 10 MG (21) PO TBPK
ORAL_TABLET | ORAL | 0 refills | Status: AC
  Filled 2024-02-16: qty 21, 6d supply, fill #0

## 2024-02-16 MED ORDER — BENZONATATE 100 MG PO CAPS
100.0000 mg | ORAL_CAPSULE | Freq: Three times a day (TID) | ORAL | 0 refills | Status: DC
  Filled 2024-02-16: qty 30, 10d supply, fill #0

## 2024-02-22 ENCOUNTER — Other Ambulatory Visit: Payer: Self-pay

## 2024-02-22 MED ORDER — BENZONATATE 100 MG PO CAPS
100.0000 mg | ORAL_CAPSULE | Freq: Three times a day (TID) | ORAL | 0 refills | Status: AC
  Filled 2024-02-23: qty 30, 10d supply, fill #0

## 2024-02-23 ENCOUNTER — Other Ambulatory Visit: Payer: Self-pay

## 2024-02-23 MED ORDER — ESTRADIOL 0.01 % VA CREA
TOPICAL_CREAM | VAGINAL | 0 refills | Status: AC
  Filled 2024-03-07: qty 42.5, 100d supply, fill #0

## 2024-02-28 ENCOUNTER — Other Ambulatory Visit: Payer: Self-pay

## 2024-02-28 MED ORDER — HYDROCORTISONE 2.5 % EX CREA
1.0000 | TOPICAL_CREAM | Freq: Two times a day (BID) | CUTANEOUS | 0 refills | Status: AC
  Filled 2024-02-28: qty 30, 10d supply, fill #0

## 2024-03-07 ENCOUNTER — Other Ambulatory Visit: Payer: Self-pay

## 2024-03-09 ENCOUNTER — Encounter

## 2024-03-13 ENCOUNTER — Encounter

## 2024-04-10 ENCOUNTER — Encounter

## 2024-04-28 ENCOUNTER — Ambulatory Visit

## 2024-04-28 ENCOUNTER — Encounter

## 2024-06-12 ENCOUNTER — Encounter

## 2024-07-28 ENCOUNTER — Ambulatory Visit

## 2024-07-28 ENCOUNTER — Encounter

## 2024-09-11 ENCOUNTER — Encounter

## 2024-10-27 ENCOUNTER — Encounter

## 2024-10-27 ENCOUNTER — Ambulatory Visit

## 2024-12-11 ENCOUNTER — Encounter

## 2025-01-26 ENCOUNTER — Ambulatory Visit

## 2025-01-26 ENCOUNTER — Encounter
